# Patient Record
Sex: Male | Born: 1945 | Race: White | Hispanic: No | Marital: Single | Smoking: Current some day smoker
Health system: Southern US, Community
[De-identification: ages and names within clinical notes are randomized; demographics above are authoritative.]

## PROBLEM LIST (undated history)

## (undated) DIAGNOSIS — K449 Diaphragmatic hernia without obstruction or gangrene: Secondary | ICD-10-CM

## (undated) DIAGNOSIS — Z87442 Personal history of urinary calculi: Secondary | ICD-10-CM

## (undated) DIAGNOSIS — I2510 Atherosclerotic heart disease of native coronary artery without angina pectoris: Secondary | ICD-10-CM

## (undated) DIAGNOSIS — C801 Malignant (primary) neoplasm, unspecified: Secondary | ICD-10-CM

## (undated) DIAGNOSIS — N189 Chronic kidney disease, unspecified: Secondary | ICD-10-CM

## (undated) DIAGNOSIS — Z87891 Personal history of nicotine dependence: Secondary | ICD-10-CM

## (undated) DIAGNOSIS — E781 Pure hyperglyceridemia: Secondary | ICD-10-CM

## (undated) DIAGNOSIS — T7840XA Allergy, unspecified, initial encounter: Secondary | ICD-10-CM

## (undated) DIAGNOSIS — Z21 Asymptomatic human immunodeficiency virus [HIV] infection status: Secondary | ICD-10-CM

## (undated) DIAGNOSIS — L409 Psoriasis, unspecified: Secondary | ICD-10-CM

## (undated) DIAGNOSIS — I10 Essential (primary) hypertension: Secondary | ICD-10-CM

## (undated) DIAGNOSIS — M1990 Unspecified osteoarthritis, unspecified site: Secondary | ICD-10-CM

## (undated) DIAGNOSIS — B001 Herpesviral vesicular dermatitis: Secondary | ICD-10-CM

## (undated) DIAGNOSIS — K219 Gastro-esophageal reflux disease without esophagitis: Secondary | ICD-10-CM

## (undated) DIAGNOSIS — N200 Calculus of kidney: Secondary | ICD-10-CM

## (undated) DIAGNOSIS — B20 Human immunodeficiency virus [HIV] disease: Secondary | ICD-10-CM

## (undated) HISTORY — DX: Diaphragmatic hernia without obstruction or gangrene: K44.9

## (undated) HISTORY — DX: Psoriasis, unspecified: L40.9

## (undated) HISTORY — DX: Atherosclerotic heart disease of native coronary artery without angina pectoris: I25.10

## (undated) HISTORY — DX: Human immunodeficiency virus (HIV) disease: B20

## (undated) HISTORY — DX: Pure hyperglyceridemia: E78.1

## (undated) HISTORY — PX: APPENDECTOMY: SHX54

## (undated) HISTORY — DX: Personal history of nicotine dependence: Z87.891

## (undated) HISTORY — DX: Allergy, unspecified, initial encounter: T78.40XA

## (undated) HISTORY — DX: Calculus of kidney: N20.0

## (undated) HISTORY — DX: Asymptomatic human immunodeficiency virus (hiv) infection status: Z21

## (undated) HISTORY — DX: Herpesviral vesicular dermatitis: B00.1

---

## 1998-08-11 ENCOUNTER — Inpatient Hospital Stay (HOSPITAL_COMMUNITY): Admission: EM | Admit: 1998-08-11 | Discharge: 1998-08-12 | Payer: Self-pay | Admitting: Emergency Medicine

## 1998-08-11 ENCOUNTER — Encounter: Payer: Self-pay | Admitting: *Deleted

## 1998-08-14 ENCOUNTER — Encounter: Payer: Self-pay | Admitting: Cardiology

## 1998-08-14 ENCOUNTER — Ambulatory Visit (HOSPITAL_COMMUNITY): Admission: RE | Admit: 1998-08-14 | Discharge: 1998-08-14 | Payer: Self-pay | Admitting: Cardiology

## 1998-08-15 ENCOUNTER — Ambulatory Visit (HOSPITAL_COMMUNITY): Admission: RE | Admit: 1998-08-15 | Discharge: 1998-08-15 | Payer: Self-pay | Admitting: Cardiology

## 1998-08-15 ENCOUNTER — Encounter: Payer: Self-pay | Admitting: Cardiology

## 1998-08-20 ENCOUNTER — Inpatient Hospital Stay (HOSPITAL_COMMUNITY): Admission: RE | Admit: 1998-08-20 | Discharge: 1998-08-24 | Payer: Self-pay | Admitting: Cardiothoracic Surgery

## 1998-08-20 ENCOUNTER — Encounter: Payer: Self-pay | Admitting: Cardiothoracic Surgery

## 1998-08-20 HISTORY — PX: CORONARY ARTERY BYPASS GRAFT: SHX141

## 1998-08-21 ENCOUNTER — Encounter: Payer: Self-pay | Admitting: Cardiothoracic Surgery

## 1998-08-22 ENCOUNTER — Encounter: Payer: Self-pay | Admitting: Cardiothoracic Surgery

## 1999-03-11 HISTORY — PX: CORONARY ARTERY BYPASS GRAFT: SHX141

## 2003-05-04 ENCOUNTER — Observation Stay (HOSPITAL_COMMUNITY): Admission: RE | Admit: 2003-05-04 | Discharge: 2003-05-05 | Payer: Self-pay | Admitting: Urology

## 2003-05-08 ENCOUNTER — Ambulatory Visit (HOSPITAL_COMMUNITY): Admission: RE | Admit: 2003-05-08 | Discharge: 2003-05-08 | Payer: Self-pay | Admitting: Urology

## 2005-08-22 ENCOUNTER — Ambulatory Visit: Payer: Self-pay | Admitting: Family Medicine

## 2006-02-24 ENCOUNTER — Ambulatory Visit: Payer: Self-pay | Admitting: Family Medicine

## 2006-12-28 ENCOUNTER — Ambulatory Visit: Payer: Self-pay | Admitting: Family Medicine

## 2008-01-11 ENCOUNTER — Ambulatory Visit: Payer: Self-pay | Admitting: Family Medicine

## 2008-07-10 ENCOUNTER — Ambulatory Visit: Payer: Self-pay | Admitting: Family Medicine

## 2009-01-11 ENCOUNTER — Ambulatory Visit: Payer: Self-pay | Admitting: Family Medicine

## 2009-07-11 ENCOUNTER — Ambulatory Visit: Payer: Self-pay | Admitting: Family Medicine

## 2010-01-18 ENCOUNTER — Ambulatory Visit: Payer: Self-pay | Admitting: Family Medicine

## 2010-03-20 ENCOUNTER — Ambulatory Visit
Admission: RE | Admit: 2010-03-20 | Discharge: 2010-03-20 | Payer: Self-pay | Source: Home / Self Care | Attending: Family Medicine | Admitting: Family Medicine

## 2010-07-26 NOTE — Op Note (Signed)
[name], [name]                          ACCOUNT NO.:  [id number]   MEDICAL RECORD NO.:  [id number]                   PATIENT TYPE:  OBV   LOCATION:  DAY                                  FACILITY:  [facility]   PHYSICIAN:  Sigmund I. Patsi Sears, M.D.         DATE OF BIRTH:  11/02/1945   DATE OF PROCEDURE:  05/04/2003  DATE OF DISCHARGE:  05/05/2003                                 OPERATIVE REPORT   PREOPERATIVE DIAGNOSES:  Right renal calculus.   POSTOPERATIVE DIAGNOSES:  Right renal calculus.   OPERATION/PROCEDURE:  1. Cystoscopy.  2. Right percutaneous nephrostolithotomy.   SURGEON:  Sigmund I. Patsi Sears, M.D.  Judie Petit. Ruel Favors, M.D.   ASSISTANT:  Susanne Borders, M.D.   ANESTHESIA:  General endotracheal anesthesia.   SPECIMENS:  Stone for stone analysis.   ESTIMATED BLOOD LOSS:  Minimal.   DRAINS:  1. 20-French Councill-tip catheter as nephrostomy tube to straight drain.  2. 5-French angiographic catheter capped through the nephrostomy tube site.   DISPOSITION:  To recovery room in stable condition.   INDICATIONS FOR PROCEDURE:  [name] is a 65 year old who was evaluated  for right flank pain with CT stone study and found to have an approximately  1.5 cm stone at his right ureteropelvic junction.  Various treatment options  were discussed with the patient including extracorporeal shock wave  lithotripsy, ureteroscopy, and percutaneous nephrostolithotomy.  Because of  the size and position of the stone, it was felt that the most definitive  procedure would be a percutaneous nephrostolithotomy.  The patient agreed to  this and consented to the procedure after understanding all of the risks,  benefits and alternatives.   DESCRIPTION OF PROCEDURE:  The patient was brought to the operating room and  placed in the prone position.  He  was given preoperative antibiotics and  general endotracheal anesthesia. Prior to the procedure, the patient  underwent placement of a  right nephrostomy tube by interventional radiology.  The patient was found to have a 5-French angiographic catheter into his  right collecting system.  The patient was prepped and draped in the typical  sterile fashion.   Dr. Miles Costain was present at the beginning of the case and he got further access  for the procedure by balloon dilating the nephrostomy tube tract and placing  a working access sheath.  For complete details of this part of the  procedure, please see Dr. Chester Holstein note.  The case was then turned over to  Dr. Patsi Sears.   We used a 26-French nephrostomy tube and performed nephroscopy.  The stone  was found in the upper pole of the right kidney.  There was no stone  material in the lower middle calyces.  The ureteropelvic junction was found  to be stone-free as well.  The stone appeared to be quite soft.  An  ultrasonic lithotriptor was used to break the stone up into several small  fragments.  Most of  the fragments were sucked through the lithotriptor.  Some of the larger fragments were grasped with three-prong graspers and  removed from the renal pelvis.  Once the patient was rendered completely  stone-free, the case was turned back over to Dr. Miles Costain.  He placed an  angiographic catheter down into the bladder.  He also placed a 20-French  Councill-tip catheter as a nephrostomy tube in the right renal pelvis.  A  nephrostogram was done and demonstrated no filling defects.  It also  demonstrated the nephrostomy tube and angiographic catheter in the correct  position.  The nephrostomy tube and angiographic catheter were sewn in place  with a 2-0 silk tie.  The patient was then awakened from his anesthesia.  An  absorbent dressing was applied to the nephrostomy tube site.  The patient  was taken to post anesthesia care unit in stable condition having tolerated  the procedure well.   Please note that Dr. Patsi Sears was present and participated in all aspects  of this case as he  was primary Careers adviser.     Susanne Borders, MD                           Sigmund I. Patsi Sears, M.D.    DR/MEDQ  D:  05/04/2003  T:  05/04/2003  Job:  [id number]

## 2010-08-21 ENCOUNTER — Encounter: Payer: Self-pay | Admitting: Family Medicine

## 2010-08-21 ENCOUNTER — Ambulatory Visit (INDEPENDENT_AMBULATORY_CARE_PROVIDER_SITE_OTHER): Payer: BC Managed Care – PPO | Admitting: Family Medicine

## 2010-08-21 VITALS — BP 130/90 | HR 80 | Wt 156.0 lb

## 2010-08-21 DIAGNOSIS — J301 Allergic rhinitis due to pollen: Secondary | ICD-10-CM

## 2010-08-21 DIAGNOSIS — N200 Calculus of kidney: Secondary | ICD-10-CM

## 2010-08-21 DIAGNOSIS — B001 Herpesviral vesicular dermatitis: Secondary | ICD-10-CM

## 2010-08-21 DIAGNOSIS — E785 Hyperlipidemia, unspecified: Secondary | ICD-10-CM

## 2010-08-21 DIAGNOSIS — K449 Diaphragmatic hernia without obstruction or gangrene: Secondary | ICD-10-CM

## 2010-08-21 DIAGNOSIS — Z21 Asymptomatic human immunodeficiency virus [HIV] infection status: Secondary | ICD-10-CM | POA: Insufficient documentation

## 2010-08-21 DIAGNOSIS — K219 Gastro-esophageal reflux disease without esophagitis: Secondary | ICD-10-CM | POA: Insufficient documentation

## 2010-08-21 DIAGNOSIS — I2510 Atherosclerotic heart disease of native coronary artery without angina pectoris: Secondary | ICD-10-CM

## 2010-08-21 DIAGNOSIS — L4050 Arthropathic psoriasis, unspecified: Secondary | ICD-10-CM

## 2010-08-21 DIAGNOSIS — B009 Herpesviral infection, unspecified: Secondary | ICD-10-CM

## 2010-08-21 MED ORDER — ATORVASTATIN CALCIUM 10 MG PO TABS: 10.0000 mg | ORAL_TABLET | Freq: Every day | ORAL | Status: DC

## 2010-08-21 NOTE — Progress Notes (Signed)
  Subjective:    Patient ID: [name], male    DOB: [birth year], 65 y.o.   MRN: [id number]  HPI    Review of Systems     Objective:   Physical Exam        Assessment & Plan:  I will switch him to dispense as written Lipitor at his request.

## 2010-08-21 NOTE — Progress Notes (Signed)
  Subjective:    Patient ID: [name], male    DOB: 1945/12/13, 65 y.o.   MRN: [id number]  HPI He is here for recheck on his lipid panel and HIV. He has been taking one laser do to indigestion-type symptoms. He continues on a trip and does occasionally have vivid dreams. He also would like to be placed back on branded Lipitor. Apparently the generic Lipitor is causing some muscle aches and pains mainly in his neck   Review of Systems Negative except as above    Objective:   Physical Exam Alert and in no distress otherwise not examined       Assessment & Plan:  HIV-positive. Hypertriglyceridemia.  Check lipid panel and viral load.

## 2010-08-22 ENCOUNTER — Telehealth: Payer: Self-pay

## 2010-08-22 LAB — LIPID PANEL
Cholesterol: 159 mg/dL (ref 0–200)
HDL: 35 mg/dL — ABNORMAL LOW (ref >39)
Total CHOL/HDL Ratio: 4.5 Ratio
VLDL: 45 mg/dL — ABNORMAL HIGH (ref 0–40)

## 2010-08-22 LAB — HIV-1 RNA QUANT-NO REFLEX-BLD: HIV-1 RNA Quant, Log: <1.3 {Log} (ref <1.30)

## 2010-08-22 NOTE — Telephone Encounter (Signed)
Pt aware of labs and mailed diet info

## 2010-08-23 ENCOUNTER — Telehealth: Payer: Self-pay

## 2010-08-23 NOTE — Telephone Encounter (Signed)
Pt informed and diet mailed

## 2010-10-17 ENCOUNTER — Encounter: Payer: Self-pay | Admitting: Family Medicine

## 2010-11-26 ENCOUNTER — Telehealth: Payer: Self-pay | Admitting: Family Medicine

## 2010-11-26 NOTE — Telephone Encounter (Signed)
DONE

## 2011-01-15 ENCOUNTER — Encounter: Payer: Self-pay | Admitting: Family Medicine

## 2011-01-15 ENCOUNTER — Ambulatory Visit (INDEPENDENT_AMBULATORY_CARE_PROVIDER_SITE_OTHER): Payer: Medicare Other | Admitting: Family Medicine

## 2011-01-15 VITALS — BP 140/82 | HR 80 | Wt 152.0 lb

## 2011-01-15 DIAGNOSIS — I2510 Atherosclerotic heart disease of native coronary artery without angina pectoris: Secondary | ICD-10-CM

## 2011-01-15 DIAGNOSIS — Z79899 Other long term (current) drug therapy: Secondary | ICD-10-CM

## 2011-01-15 DIAGNOSIS — Z23 Encounter for immunization: Secondary | ICD-10-CM

## 2011-01-15 DIAGNOSIS — L4050 Arthropathic psoriasis, unspecified: Secondary | ICD-10-CM

## 2011-01-15 DIAGNOSIS — B20 Human immunodeficiency virus [HIV] disease: Secondary | ICD-10-CM

## 2011-01-15 MED ORDER — EFAVIRENZ-EMTRICITAB-TENOFOVIR 600-200-300 MG PO TABS: 1.0000 | ORAL_TABLET | Freq: Every day | ORAL | Status: DC

## 2011-01-15 NOTE — Progress Notes (Signed)
  Subjective:    Patient ID: [name], male    DOB: [birth year], 65 y.o.   MRN: [id number]  HPI He is here for a followup visit. He continues on medications listed in the chart. He has no particular concerns or complaints. No fever, chills, headache, weight change. He continues on medications listed in the chart. He does have psoriatic arthritis mainly affecting his hands. He seems to be doing well with this.   Review of Systems Negative except as above    Objective:   Physical Exam alert and in no distress. Tympanic membranes and canals are normal. Throat is clear. Tonsils are normal. Neck is supple without adenopathy or thyromegaly. Cardiac exam shows a regular sinus rhythm without murmurs or gallops. Lungs are clear to auscultation. Abdominal exam shows no masses or tenderness.       Assessment & Plan:   1. Human immunodeficiency virus (HIV) disease  HIV 1 RNA quant-no reflex-bld, T-helper cells (CD4) count  2. Encounter for long-term (current) use of other medications  HIV 1 RNA quant-no reflex-bld, T-helper cells (CD4) count  3. Psoriatic arthritis     continue on present medications.

## 2011-01-16 LAB — T-HELPER CELLS (CD4) COUNT (NOT AT ARMC)
Total Lymphocyte: 16 % (ref 12–46)
Total lymphocyte count: 1040 /uL (ref 700–3300)
WBC, lymph enumeration: 6.5 10*3/uL (ref 4.0–10.5)

## 2011-04-08 ENCOUNTER — Telehealth: Payer: Self-pay | Admitting: Internal Medicine

## 2011-04-08 MED ORDER — LOSARTAN POTASSIUM 25 MG PO TABS: 25.0000 mg | ORAL_TABLET | Freq: Every day | ORAL | Status: DC

## 2011-04-08 NOTE — Telephone Encounter (Signed)
Cozaar was renewed

## 2011-04-10 ENCOUNTER — Telehealth: Payer: Self-pay | Admitting: Family Medicine

## 2011-04-10 NOTE — Telephone Encounter (Signed)
Talked with pharm they had received ok 04/08/11 did not know what happened

## 2011-07-13 ENCOUNTER — Other Ambulatory Visit: Payer: Self-pay | Admitting: Family Medicine

## 2011-07-14 NOTE — Telephone Encounter (Signed)
Is this ok?

## 2011-07-14 NOTE — Telephone Encounter (Signed)
Celebrex renewed.

## 2011-07-15 ENCOUNTER — Ambulatory Visit: Payer: BC Managed Care – PPO | Admitting: Family Medicine

## 2011-08-21 ENCOUNTER — Encounter: Payer: Self-pay | Admitting: Family Medicine

## 2011-08-21 ENCOUNTER — Ambulatory Visit (INDEPENDENT_AMBULATORY_CARE_PROVIDER_SITE_OTHER): Payer: Medicare Other | Admitting: Family Medicine

## 2011-08-21 VITALS — BP 150/92 | HR 80 | Ht 68.0 in | Wt 155.0 lb

## 2011-08-21 DIAGNOSIS — L4050 Arthropathic psoriasis, unspecified: Secondary | ICD-10-CM

## 2011-08-21 DIAGNOSIS — I2510 Atherosclerotic heart disease of native coronary artery without angina pectoris: Secondary | ICD-10-CM

## 2011-08-21 DIAGNOSIS — Z79899 Other long term (current) drug therapy: Secondary | ICD-10-CM

## 2011-08-21 DIAGNOSIS — E785 Hyperlipidemia, unspecified: Secondary | ICD-10-CM

## 2011-08-21 DIAGNOSIS — B009 Herpesviral infection, unspecified: Secondary | ICD-10-CM

## 2011-08-21 DIAGNOSIS — Z21 Asymptomatic human immunodeficiency virus [HIV] infection status: Secondary | ICD-10-CM

## 2011-08-21 DIAGNOSIS — B001 Herpesviral vesicular dermatitis: Secondary | ICD-10-CM

## 2011-08-21 DIAGNOSIS — J301 Allergic rhinitis due to pollen: Secondary | ICD-10-CM

## 2011-08-21 DIAGNOSIS — I10 Essential (primary) hypertension: Secondary | ICD-10-CM

## 2011-08-21 MED ORDER — LOSARTAN POTASSIUM-HCTZ 50-12.5 MG PO TABS: 1.0000 | ORAL_TABLET | Freq: Every day | ORAL | Status: DC

## 2011-08-21 MED ORDER — ACYCLOVIR 5 % EX OINT: TOPICAL_OINTMENT | CUTANEOUS | Status: AC

## 2011-08-21 NOTE — Patient Instructions (Signed)
Continue on your present medications. 

## 2011-08-21 NOTE — Progress Notes (Signed)
  Subjective:    Patient ID: [name], male    DOB: [birth year], 66 y.o.   MRN: [id number]  HPI He is here for an interval evaluation. 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 He does find some difficulty when he has to much free time. He continues on medications listed in the chart and is concerned over the Celebrex he is taking for his psoriatic arthritis. He continues on his HIV medications. He would like a refill on his topical preparation for herpes labialis. He does intermittently have difficulty with this. His had no difficulty with chest pain, shortness of breath, DOE. His allergies seem to be under good control. He is no other concerns or complaints.   Review of Systems     Objective:   Physical Exam alert and in no distress. Tympanic membranes and canals are normal. Throat is clear. Tonsils are normal. Neck is supple without adenopathy or thyromegaly. Cardiac exam shows a regular sinus rhythm without murmurs or gallops. Lungs are clear to auscultation. Abdominal exam shows no masses or tenderness with normal bowel sounds. Exam of his hands does show extensive arthritic changes.        Assessment & Plan:   1. Hypertension  losartan-hydrochlorothiazide (HYZAAR) 50-12.5 MG per tablet  2. Herpes labialis  acyclovir ointment (ZOVIRAX) 5 %  3. HIV positive  T-helper cells (CD4) count, HIV 1 RNA quant-no reflex-bld  4. Psoriatic arthritis    5. ASHD (arteriosclerotic heart disease)    6. Allergic rhinitis due to pollen    7. Encounter for long-term (current) use of other medications  Comprehensive metabolic panel, Lipid panel, T-helper cells (CD4) count, HIV 1 RNA quant-no reflex-bld  8. Hyperlipidemia LDL goal <70  Lipid panel   Continue present medication regimen.

## 2011-08-22 LAB — COMPREHENSIVE METABOLIC PANEL
Albumin: 4.3 g/dL (ref 3.5–5.2)
BUN: 12 mg/dL (ref 6–23)
Calcium: 8.9 mg/dL (ref 8.4–10.5)
Chloride: 108 mEq/L (ref 96–112)
Glucose, Bld: 99 mg/dL (ref 70–99)
Potassium: 3.9 mEq/L (ref 3.5–5.3)

## 2011-08-22 LAB — T-HELPER CELLS (CD4) COUNT (NOT AT ARMC)
Absolute CD4: 304 /uL — ABNORMAL LOW (ref 381–1469)
CD4 T Helper %: 32 % (ref 32–62)

## 2011-08-22 LAB — LIPID PANEL
HDL: 30 mg/dL — ABNORMAL LOW (ref >39)
Triglycerides: 165 mg/dL — ABNORMAL HIGH (ref <150)

## 2011-08-25 LAB — HIV-1 RNA QUANT-NO REFLEX-BLD: HIV-1 RNA Quant, Log: <1.3 {Log} (ref <1.30)

## 2011-11-17 ENCOUNTER — Other Ambulatory Visit: Payer: Self-pay | Admitting: Family Medicine

## 2012-01-11 ENCOUNTER — Other Ambulatory Visit: Payer: Self-pay | Admitting: Family Medicine

## 2012-01-12 NOTE — Telephone Encounter (Signed)
IS THIS OK 

## 2012-02-04 ENCOUNTER — Encounter: Payer: Self-pay | Admitting: Internal Medicine

## 2012-02-23 ENCOUNTER — Ambulatory Visit: Payer: BC Managed Care – PPO | Admitting: Family Medicine

## 2012-03-15 ENCOUNTER — Ambulatory Visit (INDEPENDENT_AMBULATORY_CARE_PROVIDER_SITE_OTHER): Payer: Medicare Other | Admitting: Family Medicine

## 2012-03-15 ENCOUNTER — Encounter: Payer: Self-pay | Admitting: Family Medicine

## 2012-03-15 VITALS — BP 124/80 | HR 85 | Wt 153.0 lb

## 2012-03-15 DIAGNOSIS — B001 Herpesviral vesicular dermatitis: Secondary | ICD-10-CM

## 2012-03-15 DIAGNOSIS — I10 Essential (primary) hypertension: Secondary | ICD-10-CM

## 2012-03-15 DIAGNOSIS — J301 Allergic rhinitis due to pollen: Secondary | ICD-10-CM

## 2012-03-15 DIAGNOSIS — Z21 Asymptomatic human immunodeficiency virus [HIV] infection status: Secondary | ICD-10-CM

## 2012-03-15 DIAGNOSIS — Z23 Encounter for immunization: Secondary | ICD-10-CM

## 2012-03-15 DIAGNOSIS — I2510 Atherosclerotic heart disease of native coronary artery without angina pectoris: Secondary | ICD-10-CM

## 2012-03-15 DIAGNOSIS — L4050 Arthropathic psoriasis, unspecified: Secondary | ICD-10-CM

## 2012-03-15 DIAGNOSIS — E785 Hyperlipidemia, unspecified: Secondary | ICD-10-CM

## 2012-03-15 DIAGNOSIS — B009 Herpesviral infection, unspecified: Secondary | ICD-10-CM

## 2012-03-15 LAB — COMPREHENSIVE METABOLIC PANEL
Albumin: 4.7 g/dL (ref 3.5–5.2)
CO2: 27 mEq/L (ref 19–32)
Calcium: 9.5 mg/dL (ref 8.4–10.5)
Chloride: 103 mEq/L (ref 96–112)
Glucose, Bld: 100 mg/dL — ABNORMAL HIGH (ref 70–99)
Potassium: 4.5 mEq/L (ref 3.5–5.3)
Sodium: 139 mEq/L (ref 135–145)
Total Bilirubin: 0.4 mg/dL (ref 0.3–1.2)
Total Protein: 7.2 g/dL (ref 6.0–8.3)

## 2012-03-15 LAB — LIPID PANEL
Cholesterol: 154 mg/dL (ref 0–200)
VLDL: 46 mg/dL — ABNORMAL HIGH (ref 0–40)

## 2012-03-15 LAB — CBC WITH DIFFERENTIAL/PLATELET
Eosinophils Absolute: 0.1 10*3/uL (ref 0.0–0.7)
Hemoglobin: 15 g/dL (ref 13.0–17.0)
Lymphs Abs: 1.1 10*3/uL (ref 0.7–4.0)
MCH: 31 pg (ref 26.0–34.0)
MCV: 89 fL (ref 78.0–100.0)
Monocytes Relative: 6 % (ref 3–12)
Neutrophils Relative %: 76 % (ref 43–77)
RBC: 4.84 MIL/uL (ref 4.22–5.81)

## 2012-03-15 MED ORDER — CELECOXIB 200 MG PO CAPS: 200.0000 mg | ORAL_CAPSULE | Freq: Two times a day (BID) | ORAL | Status: DC

## 2012-03-15 MED ORDER — INFLUENZA VIRUS VACC SPLIT PF IM SUSP: 0.5000 mL | Freq: Once | INTRAMUSCULAR | Status: DC

## 2012-03-15 MED ORDER — ATORVASTATIN CALCIUM 10 MG PO TABS: 10.0000 mg | ORAL_TABLET | Freq: Every day | ORAL | Status: DC

## 2012-03-15 MED ORDER — LOSARTAN POTASSIUM-HCTZ 50-12.5 MG PO TABS: 1.0000 | ORAL_TABLET | Freq: Every day | ORAL | Status: DC

## 2012-03-15 MED ORDER — EFAVIRENZ-EMTRICITAB-TENOFOVIR 600-200-300 MG PO TABS: 1.0000 | ORAL_TABLET | Freq: Every day | ORAL | Status: DC

## 2012-03-15 NOTE — Progress Notes (Signed)
  Subjective:    Patient ID: [name], male    DOB: June 21, 1945, 67 y.o.   MRN: [id number]  HPI He is here for an interval evaluation. He continues to do quite nicely on his present medications. He has had no fever, chills, headache, weight change, chest pain, DOE, PND. He has not seen his cardiologist in approximately 5 years. His psoriatic arthritis seems to be under good control although he continues to have difficulty with hand deformity. His allergies seem to be under good control. He has had no trouble with his he has not used Zovirax a long time. Social history was reviewed. He is in a 20 year relationship which is stable. He continues to work intermittently and this is going quite well. He is now on a different insurance plan.   Review of Systems Negative except as above    Objective:   Physical Exam alert and in no distress. Fundi are benign. Tympanic membranes and canals are normal. Throat is clear. Tonsils are normal. Neck is supple without adenopathy or thyromegaly. Cardiac exam shows a regular sinus rhythm without murmurs or gallops. Lungs are clear to auscultation. Abdominal exam shows no masses or tenderness with normal bowel sounds. No axillary or inguinal adenopathy. EKG shows no acute changes      Assessment & Plan:   1. Need for prophylactic vaccination and inoculation against influenza  influenza  inactive virus vaccine (FLUZONE/FLUARIX) injection 0.5 mL, Tdap vaccine greater than or equal to 7yo IM  2. Hypertension  losartan-hydrochlorothiazide (HYZAAR) 50-12.5 MG per tablet, CBC with Differential, Comprehensive metabolic panel  3. HIV positive  efavirenz-emtricitabine-tenofovir (ATRIPLA) 600-200-300 MG per tablet, CBC with Differential, Comprehensive metabolic panel, HIV 1 RNA quant-no reflex-bld, T-helper cells (CD4) count  4. Hyperlipidemia  atorvastatin (LIPITOR) 10 MG tablet, Lipid panel  5. Psoriatic arthritis  celecoxib (CELEBREX) 200 MG capsule  6.  Allergic rhinitis due to pollen    7. ASHD (arteriosclerotic heart disease)  PR ELECTROCARDIOGRAM, COMPLETE  8. Herpes labialis     his immunizations were updated. Risks and benefits were discussed. He cannot get a shingles vaccine. Review his record indicates he was scheduled for colonoscopy 3 times but canceled. He will let me know the spring when we can reschedule this. I also discussed advanced directives with him and discussed the need for him to take legal measures to get his 20 year relationship squared away to protect his partner.

## 2012-03-16 LAB — HIV-1 RNA QUANT-NO REFLEX-BLD
HIV 1 RNA Quant: <20 copies/mL (ref <20)
HIV-1 RNA Quant, Log: <1.3 {Log} (ref <1.30)

## 2012-03-17 NOTE — Progress Notes (Signed)
Quick Note:  Called pt cell# left message to call me back ______

## 2012-03-18 NOTE — Progress Notes (Signed)
Quick Note:  Called pt back and informed pt of labs pt verbalized understanding  ______

## 2012-09-20 ENCOUNTER — Ambulatory Visit (INDEPENDENT_AMBULATORY_CARE_PROVIDER_SITE_OTHER): Payer: 59 | Admitting: Family Medicine

## 2012-09-20 ENCOUNTER — Encounter: Payer: Self-pay | Admitting: Family Medicine

## 2012-09-20 VITALS — BP 124/74 | HR 60 | Wt 152.0 lb

## 2012-09-20 DIAGNOSIS — Z21 Asymptomatic human immunodeficiency virus [HIV] infection status: Secondary | ICD-10-CM

## 2012-09-20 DIAGNOSIS — L4050 Arthropathic psoriasis, unspecified: Secondary | ICD-10-CM

## 2012-09-20 DIAGNOSIS — I2510 Atherosclerotic heart disease of native coronary artery without angina pectoris: Secondary | ICD-10-CM

## 2012-09-20 DIAGNOSIS — Z79899 Other long term (current) drug therapy: Secondary | ICD-10-CM

## 2012-09-20 DIAGNOSIS — K449 Diaphragmatic hernia without obstruction or gangrene: Secondary | ICD-10-CM

## 2012-09-20 DIAGNOSIS — E785 Hyperlipidemia, unspecified: Secondary | ICD-10-CM

## 2012-09-20 NOTE — Progress Notes (Signed)
  Subjective:    Patient ID: [name], male    DOB: [birth year], 67 y.o.   MRN: [id number]  HPI He is here for an interval evaluation. He continues on his present medication regimen and is doing quite well on this. He has had no chest pain, shortness of breath, weight change, nausea vomiting, fever or chills. He is now semiretired and does find that if he has to much free time, this does become disconcerting. He also notes that he is using Celebrex less often for his psoriatic arthritis.  Review of Systems     Objective:   Physical Exam alert and in no distress. Tympanic membranes and canals are normal. Throat is clear. Tonsils are normal. Neck is supple without adenopathy or thyromegaly. Cardiac exam shows a regular sinus rhythm without murmurs or gallops. Lungs are clear to auscultation. Exam of his hands does show arthritic changes with some ulnar deviation. Abdominal exam shows no masses or tenderness.       Assessment & Plan:  HIV positive - Plan: HIV 1 RNA quant-no reflex-bld, T-helper cells (CD4) count, T-helper cells (CD4) count  Hyperlipidemia  Psoriatic arthritis  ASHD (arteriosclerotic heart disease)  Hiatus hernia syndrome  Encounter for long-term (current) use of other medications - Plan: US Aorta Initial Medicare Screen, HIV 1 RNA quant-no reflex-bld, T-helper cells (CD4) count, T-helper cells (CD4) count  discussed retirement with him and strongly encouraged him to keep his mind and body busy. He does understand this concept.

## 2012-09-21 LAB — T-HELPER CELLS (CD4) COUNT (NOT AT ARMC)
Absolute CD4: 363 /uL — ABNORMAL LOW (ref 381–1469)
CD4 T Helper %: 33 % (ref 32–62)
Total Lymphocyte: 20 % (ref 12–46)
Total lymphocyte count: 1100 /uL (ref 700–3300)
WBC, lymph enumeration: 5.5 10*3/uL (ref 4.0–10.5)

## 2012-09-23 ENCOUNTER — Ambulatory Visit
Admission: RE | Admit: 2012-09-23 | Discharge: 2012-09-23 | Disposition: A | Discharge status: Home / Self Care | Payer: Medicare Other | Source: Ambulatory Visit | Attending: Family Medicine | Admitting: Family Medicine

## 2012-09-23 DIAGNOSIS — Z79899 Other long term (current) drug therapy: Secondary | ICD-10-CM

## 2013-02-09 ENCOUNTER — Other Ambulatory Visit: Payer: 59

## 2013-02-10 ENCOUNTER — Other Ambulatory Visit (INDEPENDENT_AMBULATORY_CARE_PROVIDER_SITE_OTHER): Payer: 59

## 2013-02-10 DIAGNOSIS — Z23 Encounter for immunization: Secondary | ICD-10-CM

## 2013-03-24 ENCOUNTER — Ambulatory Visit: Payer: 59 | Admitting: Family Medicine

## 2013-03-24 ENCOUNTER — Encounter: Payer: Self-pay | Admitting: Family Medicine

## 2013-03-24 ENCOUNTER — Ambulatory Visit (INDEPENDENT_AMBULATORY_CARE_PROVIDER_SITE_OTHER): Payer: 59 | Admitting: Family Medicine

## 2013-03-24 VITALS — BP 118/82 | HR 72 | Wt 149.0 lb

## 2013-03-24 DIAGNOSIS — E785 Hyperlipidemia, unspecified: Secondary | ICD-10-CM

## 2013-03-24 DIAGNOSIS — Z21 Asymptomatic human immunodeficiency virus [HIV] infection status: Secondary | ICD-10-CM

## 2013-03-24 DIAGNOSIS — Z79899 Other long term (current) drug therapy: Secondary | ICD-10-CM

## 2013-03-24 DIAGNOSIS — J301 Allergic rhinitis due to pollen: Secondary | ICD-10-CM

## 2013-03-24 DIAGNOSIS — Z23 Encounter for immunization: Secondary | ICD-10-CM

## 2013-03-24 DIAGNOSIS — L4050 Arthropathic psoriasis, unspecified: Secondary | ICD-10-CM

## 2013-03-24 DIAGNOSIS — N200 Calculus of kidney: Secondary | ICD-10-CM

## 2013-03-24 DIAGNOSIS — I2510 Atherosclerotic heart disease of native coronary artery without angina pectoris: Secondary | ICD-10-CM

## 2013-03-24 LAB — CBC WITH DIFFERENTIAL/PLATELET
Basophils Absolute: 0 10*3/uL (ref 0.0–0.1)
Basophils Relative: 0 % (ref 0–1)
EOS ABS: 0.1 10*3/uL (ref 0.0–0.7)
EOS PCT: 2 % (ref 0–5)
HEMATOCRIT: 47.2 % (ref 39.0–52.0)
HEMOGLOBIN: 16.4 g/dL (ref 13.0–17.0)
LYMPHS ABS: 1.1 10*3/uL (ref 0.7–4.0)
Lymphocytes Relative: 18 % (ref 12–46)
MCH: 32 pg (ref 26.0–34.0)
MCHC: 34.7 g/dL (ref 30.0–36.0)
MCV: 92 fL (ref 78.0–100.0)
MONO ABS: 0.3 10*3/uL (ref 0.1–1.0)
MONOS PCT: 6 % (ref 3–12)
NEUTROS PCT: 74 % (ref 43–77)
Neutro Abs: 4.3 10*3/uL (ref 1.7–7.7)
Platelets: 153 10*3/uL (ref 150–400)
RBC: 5.13 MIL/uL (ref 4.22–5.81)
RDW: 14 % (ref 11.5–15.5)
WBC: 5.8 10*3/uL (ref 4.0–10.5)

## 2013-03-24 LAB — COMPREHENSIVE METABOLIC PANEL
ALBUMIN: 4.6 g/dL (ref 3.5–5.2)
ALT: 20 U/L (ref 0–53)
AST: 21 U/L (ref 0–37)
Alkaline Phosphatase: 82 U/L (ref 39–117)
BILIRUBIN TOTAL: 0.4 mg/dL (ref 0.3–1.2)
BUN: 16 mg/dL (ref 6–23)
CO2: 23 meq/L (ref 19–32)
Calcium: 9.2 mg/dL (ref 8.4–10.5)
Chloride: 103 mEq/L (ref 96–112)
Creat: 0.9 mg/dL (ref 0.50–1.35)
GLUCOSE: 99 mg/dL (ref 70–99)
Potassium: 3.7 mEq/L (ref 3.5–5.3)
SODIUM: 138 meq/L (ref 135–145)
TOTAL PROTEIN: 7 g/dL (ref 6.0–8.3)

## 2013-03-24 LAB — LIPID PANEL
CHOLESTEROL: 145 mg/dL (ref 0–200)
HDL: 29 mg/dL — ABNORMAL LOW (ref >39)
LDL Cholesterol: 57 mg/dL (ref 0–99)
TRIGLYCERIDES: 295 mg/dL — AB (ref <150)
Total CHOL/HDL Ratio: 5 Ratio
VLDL: 59 mg/dL — AB (ref 0–40)

## 2013-03-24 NOTE — Progress Notes (Signed)
   Subjective:    Patient ID: [name], male    DOB: [birth year], 68 y.o.   MRN: [id number]  HPI He is here for recheck. He is doing quite well. He is enjoying his semiretired. He has had some issues dealing with insurance because of his present work/retirement situation. He's had no more chest pain, shortness of breath, DOE. He continues on his HIV medications and is having no difficulty with that. No fever, chills, headache, weight change. He has had no more difficulty with kidney stones. He continues to treat his psoriatic arthritis mainly with NSAID's. His had no more kidney stones. His home life is going well.    Review of Systems Negative except as above    Objective:   Physical Exam alert and in no distress. Fundi are benign. Tympanic membranes and canals are normal. Throat is clear. Tonsils are normal. Neck is supple without adenopathy or thyromegaly. Cardiac exam shows a regular sinus rhythm without murmurs or gallops. Lungs are clear to auscultation. Abdominal exam shows no hepatosplenomegaly, masses or tenderness. Reflexes normal. No axillary or inguinal adenopathy.        Assessment & Plan:  Allergic rhinitis due to pollen  ASHD (arteriosclerotic heart disease) - Plan: CBC with Differential, Comprehensive metabolic panel, Lipid panel  HIV positive - Plan: CBC with Differential, Comprehensive metabolic panel, Lipid panel, HIV 1 RNA quant-no reflex-bld, T-helper cells (CD4) count  Hyperlipidemia - Plan: Lipid panel  Psoriatic arthritis  Kidney stones  Encounter for long-term (current) use of other medications - Plan: Pneumococcal polysaccharide vaccine 23-valent greater than or equal to 2yo subcutaneous/IM  continue present medication regimen.

## 2013-03-25 LAB — T-HELPER CELLS (CD4) COUNT (NOT AT ARMC)
ABSOLUTE CD4: 345 /uL — AB (ref 381–1469)
CD4 T Helper %: 33 % (ref 32–62)
Total Lymphocyte: 18 % (ref 12–46)
Total lymphocyte count: 1044 /uL (ref 700–3300)
WBC, lymph enumeration: 5.8 10*3/uL (ref 4.0–10.5)

## 2013-03-27 LAB — HIV-1 RNA QUANT-NO REFLEX-BLD
HIV 1 RNA Quant: <20 copies/mL (ref <20)
HIV-1 RNA Quant, Log: <1.3 {Log} (ref <1.30)

## 2013-03-28 MED ORDER — EFAVIRENZ-EMTRICITAB-TENOFOVIR 600-200-300 MG PO TABS: 1.0000 | ORAL_TABLET | Freq: Every day | ORAL | Status: DC

## 2013-03-28 MED ORDER — CELECOXIB 200 MG PO CAPS: 200.0000 mg | ORAL_CAPSULE | Freq: Two times a day (BID) | ORAL | Status: DC

## 2013-03-28 MED ORDER — ATORVASTATIN CALCIUM 10 MG PO TABS: 10.0000 mg | ORAL_TABLET | Freq: Every day | ORAL | Status: DC

## 2013-03-28 NOTE — Progress Notes (Signed)
Pt aware rx sent into pharm

## 2013-03-28 NOTE — Addendum Note (Signed)
Addended by: Lise Auer on: 03/28/2013 03:09 PM   Modules accepted: Orders

## 2013-09-21 ENCOUNTER — Encounter: Payer: Self-pay | Admitting: Family Medicine

## 2013-09-21 ENCOUNTER — Ambulatory Visit (INDEPENDENT_AMBULATORY_CARE_PROVIDER_SITE_OTHER): Payer: 59 | Admitting: Family Medicine

## 2013-09-21 VITALS — BP 110/70 | HR 68 | Wt 147.0 lb

## 2013-09-21 DIAGNOSIS — N521 Erectile dysfunction due to diseases classified elsewhere: Secondary | ICD-10-CM

## 2013-09-21 DIAGNOSIS — J301 Allergic rhinitis due to pollen: Secondary | ICD-10-CM

## 2013-09-21 DIAGNOSIS — I10 Essential (primary) hypertension: Secondary | ICD-10-CM

## 2013-09-21 DIAGNOSIS — L4050 Arthropathic psoriasis, unspecified: Secondary | ICD-10-CM

## 2013-09-21 DIAGNOSIS — N529 Male erectile dysfunction, unspecified: Secondary | ICD-10-CM

## 2013-09-21 DIAGNOSIS — E785 Hyperlipidemia, unspecified: Secondary | ICD-10-CM

## 2013-09-21 DIAGNOSIS — K449 Diaphragmatic hernia without obstruction or gangrene: Secondary | ICD-10-CM

## 2013-09-21 DIAGNOSIS — I2510 Atherosclerotic heart disease of native coronary artery without angina pectoris: Secondary | ICD-10-CM

## 2013-09-21 DIAGNOSIS — Z21 Asymptomatic human immunodeficiency virus [HIV] infection status: Secondary | ICD-10-CM

## 2013-09-21 LAB — CBC WITH DIFFERENTIAL/PLATELET
Basophils Absolute: 0 10*3/uL (ref 0.0–0.1)
Basophils Relative: 0 % (ref 0–1)
EOS ABS: 0.1 10*3/uL (ref 0.0–0.7)
EOS PCT: 2 % (ref 0–5)
HEMATOCRIT: 43.8 % (ref 39.0–52.0)
Hemoglobin: 15.7 g/dL (ref 13.0–17.0)
Lymphocytes Relative: 19 % (ref 12–46)
Lymphs Abs: 1 10*3/uL (ref 0.7–4.0)
MCH: 31.7 pg (ref 26.0–34.0)
MCHC: 35.8 g/dL (ref 30.0–36.0)
MCV: 88.3 fL (ref 78.0–100.0)
MONO ABS: 0.3 10*3/uL (ref 0.1–1.0)
Monocytes Relative: 6 % (ref 3–12)
Neutro Abs: 4 10*3/uL (ref 1.7–7.7)
Neutrophils Relative %: 73 % (ref 43–77)
Platelets: 155 10*3/uL (ref 150–400)
RBC: 4.96 MIL/uL (ref 4.22–5.81)
RDW: 14.1 % (ref 11.5–15.5)
WBC: 5.5 10*3/uL (ref 4.0–10.5)

## 2013-09-21 LAB — LIPID PANEL
Cholesterol: 158 mg/dL (ref 0–200)
HDL: 31 mg/dL — ABNORMAL LOW (ref >39)
LDL Cholesterol: 76 mg/dL (ref 0–99)
Total CHOL/HDL Ratio: 5.1 Ratio
Triglycerides: 257 mg/dL — ABNORMAL HIGH (ref <150)
VLDL: 51 mg/dL — ABNORMAL HIGH (ref 0–40)

## 2013-09-21 LAB — COMPREHENSIVE METABOLIC PANEL
ALT: 18 U/L (ref 0–53)
AST: 18 U/L (ref 0–37)
Albumin: 4.5 g/dL (ref 3.5–5.2)
Alkaline Phosphatase: 75 U/L (ref 39–117)
BUN: 15 mg/dL (ref 6–23)
CALCIUM: 9.3 mg/dL (ref 8.4–10.5)
CO2: 26 mEq/L (ref 19–32)
Chloride: 103 mEq/L (ref 96–112)
Creat: 1.08 mg/dL (ref 0.50–1.35)
Glucose, Bld: 98 mg/dL (ref 70–99)
Potassium: 3.8 mEq/L (ref 3.5–5.3)
Sodium: 137 mEq/L (ref 135–145)
TOTAL PROTEIN: 6.9 g/dL (ref 6.0–8.3)
Total Bilirubin: 0.5 mg/dL (ref 0.2–1.2)

## 2013-09-21 MED ORDER — LOSARTAN POTASSIUM-HCTZ 50-12.5 MG PO TABS
1.0000 | ORAL_TABLET | Freq: Every day | ORAL | Status: DC
Start: 2013-09-21 — End: 2014-09-18

## 2013-09-21 MED ORDER — TADALAFIL 5 MG PO TABS: 5.0000 mg | ORAL_TABLET | Freq: Every day | ORAL | Status: DC | PRN

## 2013-09-21 NOTE — Progress Notes (Signed)
   Subjective:    Patient ID: [name], male    DOB: 01-29-1946, 68 y.o.   MRN: [id number]  HPI He is here for an interval evaluation. He is doing well on his HIV medications. He has had no fever, chills, cough, congestion, weight change. No chest pain, shortness of breath, DOE or PND. He has not seen his cardiologist in a few years. His arthritis seems to be under adequate control. He does occasionally take Pepcid for reflux symptoms. His allergies are under good control. He does have some difficulty with erectile dysfunction and does occasionally use Cialis. He notes that when he skips his blood pressure medication, the erectile issue is less of a concern. 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 His home life is going well. He keeps himself very physically active. Does occasionally smoke and drinks infrequently.   Review of Systems  All other systems reviewed and are negative.      Objective:   Physical Exam alert and in no distress. Tympanic membranes and canals are normal. Throat is clear. Tonsils are normal. Neck is supple without adenopathy or thyromegaly. Cardiac exam shows a regular sinus rhythm without murmurs or gallops. Lungs are clear to auscultation. Abdominal exam shows no masses or tenderness. No inguinal or axillary adenopathy. Fundi benign.        Assessment & Plan:  Psoriatic arthritis  Hyperlipidemia  HIV positive - Plan: Comprehensive metabolic panel, Lipid panel, HIV 1 RNA quant-no reflex-bld, T-helper cells (CD4) count, CBC with Differential  ASHD (arteriosclerotic heart disease) - Plan: Comprehensive metabolic panel, Lipid panel, CBC with Differential  Allergic rhinitis due to pollen  Hiatus hernia syndrome  Essential hypertension - Plan: losartan-hydrochlorothiazide (HYZAAR) 50-12.5 MG per tablet, Comprehensive metabolic panel, Lipid panel, CBC with Differential  Erectile dysfunction due to diseases classified elsewhere - Plan: tadalafil  (CIALIS) 5 MG tablet

## 2013-09-22 LAB — T-HELPER CELLS (CD4) COUNT (NOT AT ARMC)
Absolute CD4: 355 /uL — ABNORMAL LOW (ref 381–1469)
CD4 T Helper %: 34 % (ref 32–62)
TOTAL LYMPHOCYTE: 19 % (ref 12–46)
Total lymphocyte count: 1045 /uL (ref 700–3300)
WBC, LYMPH ENUMERATION: 5.5 10*3/uL (ref 4.0–10.5)

## 2013-09-22 LAB — HIV-1 RNA QUANT-NO REFLEX-BLD

## 2013-09-27 ENCOUNTER — Other Ambulatory Visit: Payer: Self-pay

## 2013-09-27 DIAGNOSIS — Z1211 Encounter for screening for malignant neoplasm of colon: Secondary | ICD-10-CM

## 2013-09-27 NOTE — Telephone Encounter (Signed)
I HAVE PUT IN ORDER FOR PT TO HAVE A GI VISIT

## 2013-12-02 ENCOUNTER — Encounter: Payer: Self-pay | Admitting: Family Medicine

## 2014-01-18 ENCOUNTER — Other Ambulatory Visit: Payer: Self-pay | Admitting: Family Medicine

## 2014-03-14 ENCOUNTER — Ambulatory Visit: Payer: 59 | Admitting: Family Medicine

## 2014-03-22 ENCOUNTER — Encounter: Payer: Self-pay | Admitting: Family Medicine

## 2014-03-22 ENCOUNTER — Ambulatory Visit (INDEPENDENT_AMBULATORY_CARE_PROVIDER_SITE_OTHER): Payer: 59 | Admitting: Family Medicine

## 2014-03-22 VITALS — BP 110/60 | HR 100 | Wt 147.0 lb

## 2014-03-22 DIAGNOSIS — L4050 Arthropathic psoriasis, unspecified: Secondary | ICD-10-CM

## 2014-03-22 DIAGNOSIS — E785 Hyperlipidemia, unspecified: Secondary | ICD-10-CM

## 2014-03-22 DIAGNOSIS — J301 Allergic rhinitis due to pollen: Secondary | ICD-10-CM

## 2014-03-22 DIAGNOSIS — Z21 Asymptomatic human immunodeficiency virus [HIV] infection status: Secondary | ICD-10-CM

## 2014-03-22 DIAGNOSIS — N200 Calculus of kidney: Secondary | ICD-10-CM

## 2014-03-22 DIAGNOSIS — I2510 Atherosclerotic heart disease of native coronary artery without angina pectoris: Secondary | ICD-10-CM

## 2014-03-22 LAB — CBC WITH DIFFERENTIAL/PLATELET
BASOS ABS: 0 10*3/uL (ref 0.0–0.1)
BASOS PCT: 0 % (ref 0–1)
EOS ABS: 0.1 10*3/uL (ref 0.0–0.7)
Eosinophils Relative: 1 % (ref 0–5)
HEMATOCRIT: 49.5 % (ref 39.0–52.0)
HEMOGLOBIN: 17.1 g/dL — AB (ref 13.0–17.0)
Lymphocytes Relative: 16 % (ref 12–46)
Lymphs Abs: 1.2 10*3/uL (ref 0.7–4.0)
MCH: 31.7 pg (ref 26.0–34.0)
MCHC: 34.5 g/dL (ref 30.0–36.0)
MCV: 91.7 fL (ref 78.0–100.0)
MONO ABS: 0.5 10*3/uL (ref 0.1–1.0)
MPV: 9.4 fL (ref 8.6–12.4)
Monocytes Relative: 6 % (ref 3–12)
NEUTROS PCT: 77 % (ref 43–77)
Neutro Abs: 5.8 10*3/uL (ref 1.7–7.7)
Platelets: 197 10*3/uL (ref 150–400)
RBC: 5.4 MIL/uL (ref 4.22–5.81)
RDW: 13.9 % (ref 11.5–15.5)
WBC: 7.5 10*3/uL (ref 4.0–10.5)

## 2014-03-22 LAB — COMPREHENSIVE METABOLIC PANEL
ALBUMIN: 4.7 g/dL (ref 3.5–5.2)
ALT: 20 U/L (ref 0–53)
AST: 19 U/L (ref 0–37)
Alkaline Phosphatase: 97 U/L (ref 39–117)
BILIRUBIN TOTAL: 0.5 mg/dL (ref 0.2–1.2)
BUN: 16 mg/dL (ref 6–23)
CO2: 23 mEq/L (ref 19–32)
CREATININE: 1.13 mg/dL (ref 0.50–1.35)
Calcium: 9.6 mg/dL (ref 8.4–10.5)
Chloride: 101 mEq/L (ref 96–112)
GLUCOSE: 113 mg/dL — AB (ref 70–99)
Potassium: 4.1 mEq/L (ref 3.5–5.3)
Sodium: 135 mEq/L (ref 135–145)
Total Protein: 7.3 g/dL (ref 6.0–8.3)

## 2014-03-22 LAB — LIPID PANEL
CHOLESTEROL: 154 mg/dL (ref 0–200)
HDL: 28 mg/dL — AB (ref >39)
LDL Cholesterol: 63 mg/dL (ref 0–99)
TRIGLYCERIDES: 316 mg/dL — AB (ref <150)
Total CHOL/HDL Ratio: 5.5 Ratio
VLDL: 63 mg/dL — ABNORMAL HIGH (ref 0–40)

## 2014-03-22 NOTE — Progress Notes (Signed)
   Subjective:    Patient ID: [name], male    DOB: 1945/10/11, 69 y.o.   MRN: [id number]  HPI He is here for an interval evaluation. He continues on his HIV meds and is having no difficulty from them. His psoriatic arthritis does periodically get him problem and he uses Celebrex with good results. He's had no chest pain, shortness of breath, DOE or PND. His allergies are under good control. He has not had any more kidney stones. He did not get his colonoscopy but will schedule it when he comes back from [place]. 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 His home life is going well. Family and social history were also reviewed.   Review of Systems  All other systems reviewed and are negative.      Objective:   Physical Exam alert and in no distress. Tympanic membranes and canals are normal. Throat is clear. Tonsils are normal. Neck is supple without adenopathy or thyromegaly. Cardiac exam shows a regular sinus rhythm without murmurs or gallops. Lungs are clear to auscultation. Abdominal exam shows no masses or tenderness with normal bowel sounds       Assessment & Plan:  Psoriatic arthritis - Plan: CBC with Differential, Comprehensive metabolic panel  HIV positive - Plan: CBC with Differential, Comprehensive metabolic panel, Lipid panel, HIV 1 RNA quant-no reflex-bld, T-helper cells (CD4) count  ASHD (arteriosclerotic heart disease)  Allergic rhinitis due to pollen  Hyperlipidemia - Plan: Lipid panel  Kidney stones  he is doing a great job taking care of himself. He will call when he comes back from [place] to get scheduled for colonoscopy.

## 2014-03-23 LAB — T-HELPER CELLS (CD4) COUNT (NOT AT ARMC)
ABSOLUTE CD4: 360 /uL — AB (ref 381–1469)
CD4 T HELPER %: 30 % — AB (ref 32–62)
TOTAL LYMPHOCYTE COUNT: 1200 /uL (ref 700–3300)
Total Lymphocyte: 16 % (ref 12–46)
WBC, lymph enumeration: 7.5 10*3/uL (ref 4.0–10.5)

## 2014-03-24 LAB — HIV-1 RNA QUANT-NO REFLEX-BLD

## 2014-03-27 ENCOUNTER — Other Ambulatory Visit: Payer: Self-pay

## 2014-03-29 ENCOUNTER — Other Ambulatory Visit: Payer: Self-pay | Admitting: *Deleted

## 2014-03-29 ENCOUNTER — Other Ambulatory Visit (INDEPENDENT_AMBULATORY_CARE_PROVIDER_SITE_OTHER): Payer: 59

## 2014-03-29 DIAGNOSIS — Z23 Encounter for immunization: Secondary | ICD-10-CM

## 2014-03-29 MED ORDER — EFAVIRENZ-EMTRICITAB-TENOFOVIR 600-200-300 MG PO TABS: ORAL_TABLET | ORAL | Status: DC

## 2014-05-07 ENCOUNTER — Other Ambulatory Visit: Payer: Self-pay | Admitting: Family Medicine

## 2014-05-08 ENCOUNTER — Telehealth: Payer: Self-pay | Admitting: Family Medicine

## 2014-05-08 DIAGNOSIS — L4050 Arthropathic psoriasis, unspecified: Secondary | ICD-10-CM

## 2014-05-08 MED ORDER — CELECOXIB 200 MG PO CAPS: 200.0000 mg | ORAL_CAPSULE | Freq: Two times a day (BID) | ORAL | Status: DC

## 2014-05-08 NOTE — Telephone Encounter (Signed)
[facility] req refill Celebrex 200 mg

## 2014-09-18 ENCOUNTER — Encounter: Payer: Self-pay | Admitting: Family Medicine

## 2014-09-18 ENCOUNTER — Ambulatory Visit (INDEPENDENT_AMBULATORY_CARE_PROVIDER_SITE_OTHER): Payer: Medicare Other | Admitting: Family Medicine

## 2014-09-18 VITALS — BP 100/60 | HR 100 | Ht 68.0 in | Wt 149.0 lb

## 2014-09-18 DIAGNOSIS — I2510 Atherosclerotic heart disease of native coronary artery without angina pectoris: Secondary | ICD-10-CM | POA: Diagnosis not present

## 2014-09-18 DIAGNOSIS — I10 Essential (primary) hypertension: Secondary | ICD-10-CM

## 2014-09-18 DIAGNOSIS — Z21 Asymptomatic human immunodeficiency virus [HIV] infection status: Secondary | ICD-10-CM

## 2014-09-18 DIAGNOSIS — B001 Herpesviral vesicular dermatitis: Secondary | ICD-10-CM

## 2014-09-18 DIAGNOSIS — J301 Allergic rhinitis due to pollen: Secondary | ICD-10-CM

## 2014-09-18 DIAGNOSIS — E785 Hyperlipidemia, unspecified: Secondary | ICD-10-CM | POA: Diagnosis not present

## 2014-09-18 DIAGNOSIS — N200 Calculus of kidney: Secondary | ICD-10-CM

## 2014-09-18 DIAGNOSIS — K449 Diaphragmatic hernia without obstruction or gangrene: Secondary | ICD-10-CM

## 2014-09-18 DIAGNOSIS — Z87891 Personal history of nicotine dependence: Secondary | ICD-10-CM | POA: Diagnosis not present

## 2014-09-18 DIAGNOSIS — L4050 Arthropathic psoriasis, unspecified: Secondary | ICD-10-CM

## 2014-09-18 LAB — LIPID PANEL
Cholesterol: 178 mg/dL (ref 0–200)
HDL: 27 mg/dL — AB (ref >=40)
LDL CALC: 85 mg/dL (ref 0–99)
TRIGLYCERIDES: 329 mg/dL — AB (ref <150)
Total CHOL/HDL Ratio: 6.6 Ratio
VLDL: 66 mg/dL — ABNORMAL HIGH (ref 0–40)

## 2014-09-18 LAB — COMPREHENSIVE METABOLIC PANEL
ALBUMIN: 4.6 g/dL (ref 3.5–5.2)
ALK PHOS: 79 U/L (ref 39–117)
ALT: 24 U/L (ref 0–53)
AST: 23 U/L (ref 0–37)
BUN: 19 mg/dL (ref 6–23)
CALCIUM: 9.7 mg/dL (ref 8.4–10.5)
CO2: 24 meq/L (ref 19–32)
CREATININE: 1.24 mg/dL (ref 0.50–1.35)
Chloride: 100 mEq/L (ref 96–112)
GLUCOSE: 122 mg/dL — AB (ref 70–99)
POTASSIUM: 3.6 meq/L (ref 3.5–5.3)
Sodium: 136 mEq/L (ref 135–145)
Total Bilirubin: 0.6 mg/dL (ref 0.2–1.2)
Total Protein: 7.2 g/dL (ref 6.0–8.3)

## 2014-09-18 MED ORDER — LOSARTAN POTASSIUM-HCTZ 50-12.5 MG PO TABS: 1.0000 | ORAL_TABLET | Freq: Every day | ORAL | Status: DC

## 2014-09-18 MED ORDER — ZOLPIDEM TARTRATE 5 MG PO TABS: 5.0000 mg | ORAL_TABLET | Freq: Every evening | ORAL | Status: DC | PRN

## 2014-09-18 MED ORDER — NITROGLYCERIN 0.3 MG SL SUBL: 0.3000 mg | SUBLINGUAL_TABLET | SUBLINGUAL | Status: AC | PRN

## 2014-09-18 MED ORDER — EFAVIRENZ-EMTRICITAB-TENOFOVIR 600-200-300 MG PO TABS: ORAL_TABLET | ORAL | Status: DC

## 2014-09-18 MED ORDER — LIPITOR 10 MG PO TABS: ORAL_TABLET | ORAL | Status: DC

## 2014-09-18 NOTE — Progress Notes (Signed)
Subjective:    Patient ID: [name], male    DOB: June 14, 1945, 69 y.o.   MRN: [id number]  HPI He is here for a six-month follow-up. He continues to do quite well. 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 He has no symptoms of depression. He has had previous abdominal ultrasound to evaluate for AAA. He has had no problems with peripheral arterial disease. His physical activity is adequate. He is not interested in having a Colonoscopy. He continues on Lipitor. He has had no chest pain, shortness breath, DOE. He has not seen his cardiologist in quite some time. He would like a refill on his nitroglycerin. His allergies are under good control. He does have psoriatic arthritis and does have changes in his hands consistent with this. He has had no reflux symptoms. His herpes is not giving him any trouble. He has not had kidney stones in quite some time. He did quit smoking approximately one or 2 years ago. He does qualify to have the limited CT scanning done.Family and social history as well as felt maintenance and immunizations were reviewed. He is in a long-term relationship which does seem to be going fairly well.  Review of Systems  All other systems reviewed and are negative.      Objective:   Physical Exam Alert and in no distress. EOMI. Funduscopic exam normal. Tympanic membranes and canals are normal. Pharyngeal area is normal. Neck is supple without adenopathy or thyromegaly. Cardiac exam shows a regular sinus rhythm without murmurs or gallops. Lungs are clear to auscultation.Exam of his hands does show chronic degenerative changes with ulnar deviation. Abdominal exam shows no masses or tenderness. No axillary or inguinal adenopathy is noted.        Assessment & Plan:     Shared Decision Making Visit Lung Cancer Screening Program [phone])   Eligibility:  Age 42 y.o.  Pack Years Smoking History Calculation 40   (# packs/per year x # years  smoked)  Recent History of coughing up blood  no  Unexplained weight loss? no ( >Than 15 pounds within the last 6 months )  Prior History Lung / other cancer no (Diagnosis within the last 5 years already requiring surveillance chest CT Scans).  Smoking Status Former Smoker  Former Smokers: Years since quit: 2 years  Quit Date: 2014  Visit Components:  Discussion included one or more decision making aids. yes  Discussion included risk/benefits of screening. yes  Discussion included potential follow up diagnostic testing for abnormal scans. yes  Discussion included meaning and risk of over diagnosis. yes  Discussion included meaning and risk of False Positives. yes  Discussion included meaning of total radiation exposure. yes  Counseling Included:  Importance of adherence to annual lung cancer LDCT screening. yes  Impact of comorbidities on ability to participate in the program. yes  Ability and willingness to under diagnostic treatment. yes  Smoking Cessation Counseling:  Current Smokers:   Discussed importance of smoking cessation. yes  Information about tobacco cessation classes and interventions provided to patient. yes  Patient provided with "ticket" for LDCT Scan. yes  Symptomatic Patient. no  Counseling(Intermediate counseling: > three minutes) 99406  Diagnosis Code: Tobacco Use Z72.0  Asymptomatic Patient yes  Counseling (Intermediate counseling: > three minutes counseling) [id number]  Former Smokers:   Discussed the importance of maintaining cigarette abstinence. yes  Diagnosis Code: Personal History of Nicotine Dependence. Z87.891  Information about tobacco cessation classes and interventions  provided to patient. Refused  Patient provided with "ticket" for LDCT Scan. no  Written Order for Lung Cancer Screening with LDCT placed in Epic. Yes (CT Chest Lung Cancer Screening Low Dose W/O CM) [id number] Z12.2-Screening of respiratory  organs Z87.891-Personal history of nicotine dependence HIV positive - Plan: CBC with Differential/Platelet, Comprehensive metabolic panel, Lipid panel, HIV 1 RNA quant-no reflex-bld, T-helper cells (CD4) count, efavirenz-emtricitabine-tenofovir (ATRIPLA) 600-200-300 MG per tablet  Hyperlipidemia - Plan: Lipid panel, LIPITOR 10 MG tablet  Psoriatic arthritis - Plan: CBC with Differential/Platelet, Comprehensive metabolic panel  ASHD (arteriosclerotic heart disease) - Plan: nitroGLYCERIN (NITROSTAT) 0.3 MG SL tablet, EKG 12-Lead, Ambulatory referral to Cardiology  Allergic rhinitis due to pollen  Hiatus hernia syndrome  Kidney stones  Herpes labialis  Essential hypertension - Plan: losartan-hydrochlorothiazide (HYZAAR) 50-12.5 MG per tablet  Former smoker - Plan: CT CHEST LUNG CA SCREEN LOW DOSE W/O CM Encouraged him to continue to take good care of himself. Also discussed smoking and remaining smoke free. He does occasionally light up and I strongly encouraged him to avoid this. Be referred to cardiology since it has been several years. Was also given information on advanced directive and counseled concerning this.  Wyatt Haste, MD

## 2014-09-19 LAB — CBC WITH DIFFERENTIAL/PLATELET
BASOS ABS: 0 10*3/uL (ref 0.0–0.1)
Basophils Relative: 0 % (ref 0–1)
EOS PCT: 1 % (ref 0–5)
Eosinophils Absolute: 0.1 10*3/uL (ref 0.0–0.7)
HCT: 46.4 % (ref 39.0–52.0)
HEMOGLOBIN: 16 g/dL (ref 13.0–17.0)
LYMPHS ABS: 1.1 10*3/uL (ref 0.7–4.0)
Lymphocytes Relative: 18 % (ref 12–46)
MCH: 31.3 pg (ref 26.0–34.0)
MCHC: 34.5 g/dL (ref 30.0–36.0)
MCV: 90.6 fL (ref 78.0–100.0)
MONO ABS: 0.5 10*3/uL (ref 0.1–1.0)
MPV: 8.9 fL (ref 8.6–12.4)
Monocytes Relative: 8 % (ref 3–12)
NEUTROS PCT: 73 % (ref 43–77)
Neutro Abs: 4.5 10*3/uL (ref 1.7–7.7)
Platelets: 178 10*3/uL (ref 150–400)
RBC: 5.12 MIL/uL (ref 4.22–5.81)
RDW: 14.1 % (ref 11.5–15.5)
WBC: 6.1 10*3/uL (ref 4.0–10.5)

## 2014-09-19 LAB — T-HELPER CELLS (CD4) COUNT (NOT AT ARMC)
Absolute CD4: 351 /uL — ABNORMAL LOW (ref 381–1469)
CD4 T Helper %: 32 % (ref 32–62)
Total Lymphocyte: 18 % (ref 12–46)
Total lymphocyte count: 1098 /uL (ref 700–3300)
WBC, lymph enumeration: 6.1 10*3/uL (ref 4.0–10.5)

## 2014-09-19 LAB — HIV-1 RNA QUANT-NO REFLEX-BLD: HIV-1 RNA Quant, Log: <1.3 {Log} (ref <1.30)

## 2014-10-03 ENCOUNTER — Ambulatory Visit
Admission: RE | Admit: 2014-10-03 | Discharge: 2014-10-03 | Disposition: A | Discharge status: Home / Self Care | Payer: Medicare Other | Source: Ambulatory Visit | Attending: Family Medicine | Admitting: Family Medicine

## 2014-10-03 DIAGNOSIS — Z87891 Personal history of nicotine dependence: Secondary | ICD-10-CM

## 2014-11-20 ENCOUNTER — Ambulatory Visit: Payer: Medicare Other | Admitting: Interventional Cardiology

## 2015-01-26 ENCOUNTER — Telehealth: Payer: Self-pay | Admitting: Family Medicine

## 2015-01-26 NOTE — Telephone Encounter (Signed)
Pt called back and said he had a appt in [place] and was to busy to come in now for a medcheck appt and would make a CPE or a medcheck appt when he come in [place]

## 2015-01-26 NOTE — Telephone Encounter (Signed)
Called and left message for pt to try and schedule a med check before the end of the year left message on cell and home

## 2015-02-05 ENCOUNTER — Institutional Professional Consult (permissible substitution): Payer: Medicare Other | Admitting: Family Medicine

## 2015-02-07 ENCOUNTER — Telehealth: Payer: Self-pay

## 2015-02-07 DIAGNOSIS — Z21 Asymptomatic human immunodeficiency virus [HIV] infection status: Secondary | ICD-10-CM

## 2015-02-07 MED ORDER — EFAVIRENZ-EMTRICITAB-TENOFOVIR 600-200-300 MG PO TABS: ORAL_TABLET | ORAL | Status: DC

## 2015-02-07 NOTE — Telephone Encounter (Signed)
Pt called requesting a refill on his Atripla 600-200-300mg  #90 sent through [facility]

## 2015-02-13 ENCOUNTER — Telehealth: Payer: Self-pay

## 2015-02-13 NOTE — Telephone Encounter (Signed)
It is no longer [facility] for this pt, it is [facility] (already put in chart demographics as pharmacy)

## 2015-03-21 ENCOUNTER — Encounter: Payer: Self-pay | Admitting: Family Medicine

## 2015-03-21 ENCOUNTER — Ambulatory Visit (INDEPENDENT_AMBULATORY_CARE_PROVIDER_SITE_OTHER): Payer: Medicare Other | Admitting: Family Medicine

## 2015-03-21 VITALS — BP 132/78 | HR 83 | Ht 68.0 in | Wt 143.8 lb

## 2015-03-21 DIAGNOSIS — Z23 Encounter for immunization: Secondary | ICD-10-CM

## 2015-03-21 DIAGNOSIS — E785 Hyperlipidemia, unspecified: Secondary | ICD-10-CM | POA: Diagnosis not present

## 2015-03-21 DIAGNOSIS — Z21 Asymptomatic human immunodeficiency virus [HIV] infection status: Secondary | ICD-10-CM | POA: Diagnosis not present

## 2015-03-21 DIAGNOSIS — K219 Gastro-esophageal reflux disease without esophagitis: Secondary | ICD-10-CM | POA: Diagnosis not present

## 2015-03-21 DIAGNOSIS — B001 Herpesviral vesicular dermatitis: Secondary | ICD-10-CM | POA: Diagnosis not present

## 2015-03-21 DIAGNOSIS — L4050 Arthropathic psoriasis, unspecified: Secondary | ICD-10-CM

## 2015-03-21 DIAGNOSIS — I2510 Atherosclerotic heart disease of native coronary artery without angina pectoris: Secondary | ICD-10-CM

## 2015-03-21 LAB — COMPREHENSIVE METABOLIC PANEL
ALBUMIN: 4.3 g/dL (ref 3.6–5.1)
ALK PHOS: 78 U/L (ref 40–115)
ALT: 15 U/L (ref 9–46)
AST: 16 U/L (ref 10–35)
BILIRUBIN TOTAL: 0.3 mg/dL (ref 0.2–1.2)
BUN: 13 mg/dL (ref 7–25)
CALCIUM: 9.1 mg/dL (ref 8.6–10.3)
CO2: 26 mmol/L (ref 20–31)
Chloride: 104 mmol/L (ref 98–110)
Creat: 1.07 mg/dL (ref 0.70–1.25)
GLUCOSE: 109 mg/dL — AB (ref 65–99)
Potassium: 4 mmol/L (ref 3.5–5.3)
Sodium: 135 mmol/L (ref 135–146)
TOTAL PROTEIN: 6.5 g/dL (ref 6.1–8.1)

## 2015-03-21 LAB — LIPID PANEL
CHOLESTEROL: 158 mg/dL (ref 125–200)
HDL: 25 mg/dL — AB (ref >=40)
LDL Cholesterol: 84 mg/dL (ref <130)
Total CHOL/HDL Ratio: 6.3 Ratio — ABNORMAL HIGH (ref <=5.0)
Triglycerides: 245 mg/dL — ABNORMAL HIGH (ref <150)
VLDL: 49 mg/dL — ABNORMAL HIGH (ref <30)

## 2015-03-21 LAB — CBC WITH DIFFERENTIAL/PLATELET
Basophils Absolute: 0 10*3/uL (ref 0.0–0.1)
Basophils Relative: 0 % (ref 0–1)
Eosinophils Absolute: 0 10*3/uL (ref 0.0–0.7)
Eosinophils Relative: 1 % (ref 0–5)
HEMATOCRIT: 45 % (ref 39.0–52.0)
Hemoglobin: 15.6 g/dL (ref 13.0–17.0)
LYMPHS PCT: 20 % (ref 12–46)
Lymphs Abs: 0.9 10*3/uL (ref 0.7–4.0)
MCH: 31.8 pg (ref 26.0–34.0)
MCHC: 34.7 g/dL (ref 30.0–36.0)
MCV: 91.8 fL (ref 78.0–100.0)
MONOS PCT: 7 % (ref 3–12)
MPV: 9.2 fL (ref 8.6–12.4)
Monocytes Absolute: 0.3 10*3/uL (ref 0.1–1.0)
NEUTROS ABS: 3.2 10*3/uL (ref 1.7–7.7)
Neutrophils Relative %: 72 % (ref 43–77)
Platelets: 136 10*3/uL — ABNORMAL LOW (ref 150–400)
RBC: 4.9 MIL/uL (ref 4.22–5.81)
RDW: 14.2 % (ref 11.5–15.5)
WBC: 4.5 10*3/uL (ref 4.0–10.5)

## 2015-03-21 NOTE — Progress Notes (Signed)
   Subjective:    Patient ID: [name], male    DOB: [birth year], 70 y.o.   MRN: [id number]  HPI He is here for a follow-up visit. He does have underlying heart disease but is having no chest pain, shortness of breath, weakness or palpitations. He continues to do well on his Atripla. He does have a history of hyperlipidemia. His psoriatic arthritis is doing well. He occasionally will use of Celebrex for this. He rarely has difficulty with reflux disease. He does get a herpes labialis infection when he is exposed to sunlight which usually occurs when he goes to the [place]. He does go there several times per year as his family does own property down there. He has no other concerns or complaints. Social history, health maintenance and immunizations were reviewed.   Review of Systems     Objective:   Physical Exam Alert and in no distress. Tympanic membranes and canals are normal. Pharyngeal area is normal. Neck is supple without adenopathy or thyromegaly. Cardiac exam shows a regular sinus rhythm without murmurs or gallops. Lungs are clear to auscultation., Abdominal shows no masses or tenderness. No axillary or inguinal adenopathy.Arthritic changes with ulnar deviation are noted in his hands.        Assessment & Plan:  ASHD (arteriosclerotic heart disease) - Plan: CBC with Differential/Platelet, Comprehensive metabolic panel, Lipid panel  HIV positive ([place]) - Plan: Flu vaccine HIGH DOSE PF (Fluzone High dose), CBC with Differential/Platelet, Comprehensive metabolic panel, Lipid panel, HIV 1 RNA quant-no reflex-bld, T-helper cells (CD4) count (not at [facility])  Hyperlipidemia - Plan: Lipid panel  Psoriatic arthritis ([place])  Gastroesophageal reflux disease without esophagitis  Herpes labialis  Need for prophylactic vaccination and inoculation against influenza - Plan: Flu vaccine HIGH DOSE PF (Fluzone High dose) all of his medicines at the present time do notneed to be renewed.I  will also order a Cologard

## 2015-03-22 LAB — T-HELPER CELLS (CD4) COUNT (NOT AT ARMC)
Absolute CD4: 367 cells/uL — ABNORMAL LOW (ref 381–1469)
CD4 T HELPER %: 37 % (ref 32–62)
TOTAL LYMPHOCYTE COUNT: 1001 {cells}/uL (ref 700–3300)

## 2015-03-22 LAB — HIV-1 RNA QUANT-NO REFLEX-BLD
HIV 1 RNA Quant: <20 copies/mL (ref <20)
HIV-1 RNA Quant, Log: <1.3 Log copies/mL (ref <1.30)

## 2015-05-29 ENCOUNTER — Other Ambulatory Visit: Payer: Self-pay | Admitting: Acute Care

## 2015-05-29 DIAGNOSIS — Z87891 Personal history of nicotine dependence: Secondary | ICD-10-CM

## 2015-07-11 ENCOUNTER — Other Ambulatory Visit: Payer: Self-pay | Admitting: Family Medicine

## 2015-09-12 ENCOUNTER — Other Ambulatory Visit: Payer: Self-pay | Admitting: Family Medicine

## 2015-09-13 NOTE — Telephone Encounter (Signed)
Is this okay?

## 2015-09-13 NOTE — Telephone Encounter (Signed)
He needs a follow-up appointment

## 2015-09-13 NOTE — Telephone Encounter (Signed)
Pt has appointment 

## 2015-09-25 ENCOUNTER — Ambulatory Visit (INDEPENDENT_AMBULATORY_CARE_PROVIDER_SITE_OTHER): Payer: Medicare Other | Admitting: Family Medicine

## 2015-09-25 ENCOUNTER — Encounter: Payer: Self-pay | Admitting: Family Medicine

## 2015-09-25 VITALS — BP 128/80 | HR 80 | Ht 68.0 in | Wt 146.2 lb

## 2015-09-25 DIAGNOSIS — E785 Hyperlipidemia, unspecified: Secondary | ICD-10-CM | POA: Diagnosis not present

## 2015-09-25 DIAGNOSIS — K219 Gastro-esophageal reflux disease without esophagitis: Secondary | ICD-10-CM

## 2015-09-25 DIAGNOSIS — J301 Allergic rhinitis due to pollen: Secondary | ICD-10-CM

## 2015-09-25 DIAGNOSIS — Z1159 Encounter for screening for other viral diseases: Secondary | ICD-10-CM | POA: Diagnosis not present

## 2015-09-25 DIAGNOSIS — Z1211 Encounter for screening for malignant neoplasm of colon: Secondary | ICD-10-CM

## 2015-09-25 DIAGNOSIS — Z21 Asymptomatic human immunodeficiency virus [HIV] infection status: Secondary | ICD-10-CM

## 2015-09-25 DIAGNOSIS — N522 Drug-induced erectile dysfunction: Secondary | ICD-10-CM

## 2015-09-25 DIAGNOSIS — L4050 Arthropathic psoriasis, unspecified: Secondary | ICD-10-CM

## 2015-09-25 DIAGNOSIS — I2510 Atherosclerotic heart disease of native coronary artery without angina pectoris: Secondary | ICD-10-CM

## 2015-09-25 NOTE — Progress Notes (Signed)
   Subjective:    Patient ID: [name], male    DOB: 1946-01-25, 70 y.o.   MRN: [id number]  HPI He is here for an interval evaluation. He continues on his Atripla and is having no trouble with that medication. His psoriatic arthritis is also seen to be under good control. He's had no chest pain, shortness of breath, DOE. He continues on his blood pressure medication and does note that when he skips a dose, he has less difficulty with erectile dysfunction. He will occasionally use Cialis. His allergies seem to be under good control. He does have a history of hyperlipidemia. His reflux disease causes very little difficulty. Cologuard was ordered on his last visit but apparently was not accomplished. I will reorder that. He has been under last stress dealing with a family owned hotel in the [place]. He has been doing a lot of work on this which has some quite frustrated. His home life is going well. 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   Review of Systems     Objective:   Physical Exam Alert and in no distress. Tympanic membranes and canals are normal. Pharyngeal area is normal. Neck is supple without adenopathy or thyromegaly. Cardiac exam shows a regular sinus rhythm without murmurs or gallops. Lungs are clear to auscultation. Abdominal exam shows no masses or tenderness.        Assessment & Plan:  HIV positive ([place]) - Plan: CBC with Differential/Platelet, Comprehensive metabolic panel, Lipid panel, HIV 1 RNA quant-no reflex-bld, T-helper cells (CD4) count (not at [facility])  Hyperlipidemia  Psoriatic arthritis ([place]) - Plan: CBC with Differential/Platelet, Comprehensive metabolic panel  ASHD (arteriosclerotic heart disease) - Plan: CBC with Differential/Platelet, Comprehensive metabolic panel, Lipid panel  Seasonal allergic rhinitis due to pollen  Gastroesophageal reflux disease without esophagitis  Screen for colon cancer - Plan: Cologuard  Need for  hepatitis C screening test - Plan: Hepatitis C antibody Continue on present medications. Encouraged him to continue to get professional help dealing with the hotel. He readily recognizes that that's not his area of expertise and is making appropriate steps to get professional guidance on this.

## 2015-09-26 LAB — COMPREHENSIVE METABOLIC PANEL
ALBUMIN: 4.4 g/dL (ref 3.6–5.1)
ALT: 14 U/L (ref 9–46)
AST: 14 U/L (ref 10–35)
Alkaline Phosphatase: 81 U/L (ref 40–115)
BUN: 13 mg/dL (ref 7–25)
CHLORIDE: 108 mmol/L (ref 98–110)
CO2: 20 mmol/L (ref 20–31)
CREATININE: 1.08 mg/dL (ref 0.70–1.25)
Calcium: 9.1 mg/dL (ref 8.6–10.3)
GLUCOSE: 108 mg/dL — AB (ref 65–99)
Potassium: 4 mmol/L (ref 3.5–5.3)
SODIUM: 138 mmol/L (ref 135–146)
Total Bilirubin: 0.4 mg/dL (ref 0.2–1.2)
Total Protein: 6.8 g/dL (ref 6.1–8.1)

## 2015-09-26 LAB — T-HELPER CELLS (CD4) COUNT (NOT AT ARMC)
Absolute CD4: 442 cells/uL (ref 381–1469)
CD4 T Helper %: 36 % (ref 32–62)
Total lymphocyte count: 1228 cells/uL (ref 700–3300)

## 2015-09-26 LAB — CBC WITH DIFFERENTIAL/PLATELET
Basophils Absolute: 0 cells/uL (ref 0–200)
Basophils Relative: 0 %
EOS PCT: 1 %
Eosinophils Absolute: 53 cells/uL (ref 15–500)
HCT: 45.9 % (ref 38.5–50.0)
Hemoglobin: 15.6 g/dL (ref 13.2–17.1)
LYMPHS ABS: 1166 {cells}/uL (ref 850–3900)
Lymphocytes Relative: 22 %
MCH: 31.6 pg (ref 27.0–33.0)
MCHC: 34 g/dL (ref 32.0–36.0)
MCV: 93.1 fL (ref 80.0–100.0)
MONO ABS: 265 {cells}/uL (ref 200–950)
MONOS PCT: 5 %
MPV: 9 fL (ref 7.5–12.5)
NEUTROS ABS: 3816 {cells}/uL (ref 1500–7800)
NEUTROS PCT: 72 %
PLATELETS: 148 10*3/uL (ref 140–400)
RBC: 4.93 MIL/uL (ref 4.20–5.80)
RDW: 14 % (ref 11.0–15.0)
WBC: 5.3 10*3/uL (ref 4.0–10.5)

## 2015-09-26 LAB — LIPID PANEL
CHOL/HDL RATIO: 5.1 ratio — AB (ref <=5.0)
CHOLESTEROL: 137 mg/dL (ref 125–200)
HDL: 27 mg/dL — ABNORMAL LOW (ref >=40)
LDL Cholesterol: 64 mg/dL (ref <130)
Triglycerides: 230 mg/dL — ABNORMAL HIGH (ref <150)
VLDL: 46 mg/dL — AB (ref <30)

## 2015-09-26 LAB — HIV-1 RNA QUANT-NO REFLEX-BLD: HIV 1 RNA Quant: <20 copies/mL (ref <20)

## 2015-09-26 LAB — HEPATITIS C ANTIBODY: HCV Ab: NEGATIVE

## 2015-10-09 LAB — COLOGUARD: COLOGUARD: NEGATIVE

## 2015-10-22 ENCOUNTER — Telehealth: Payer: Self-pay | Admitting: Family Medicine

## 2015-10-22 NOTE — Telephone Encounter (Signed)
Called pt & informed Cologuard results were negative & needs to repeat in 3 years

## 2015-10-23 ENCOUNTER — Ambulatory Visit (INDEPENDENT_AMBULATORY_CARE_PROVIDER_SITE_OTHER): Payer: Medicare Other | Admitting: Family Medicine

## 2015-10-23 ENCOUNTER — Other Ambulatory Visit: Payer: Self-pay | Admitting: Family Medicine

## 2015-10-23 ENCOUNTER — Encounter: Payer: Self-pay | Admitting: Family Medicine

## 2015-10-23 VITALS — BP 120/86 | HR 90

## 2015-10-23 DIAGNOSIS — L918 Other hypertrophic disorders of the skin: Secondary | ICD-10-CM

## 2015-10-23 DIAGNOSIS — D229 Melanocytic nevi, unspecified: Secondary | ICD-10-CM

## 2015-10-23 LAB — COLOGUARD: COLOGUARD: NEGATIVE

## 2015-10-23 MED ORDER — LIDOCAINE-EPINEPHRINE 2 %-1:100000 IJ SOLN
1.0000 mL | Freq: Once | INTRAMUSCULAR | Status: AC
Start: 2015-10-23 — End: 2015-10-23
  Administered 2015-10-23: 1 mL via INTRADERMAL

## 2015-10-23 NOTE — Progress Notes (Signed)
   Subjective:    Patient ID: [name], male    DOB: 04-Dec-1945, 70 y.o.   MRN: [id number]  HPI He has a 2 cm mole present on the left arm medial area that is constantly being rubbed and irritated due to clothing. He would like this removed.   Review of Systems     Objective:   Physical Exam 1 cm mole is noted in the left medial arm area.       Assessment & Plan:  Skin tag - Plan: lidocaine-EPINEPHrine (XYLOCAINE W/EPI) 2 %-1:100000 (with pres) injection 1 mL  Benign mole  The area was injected with Xylocaine and epinephrine. It was excised without difficulty and the base was hyfrecated.

## 2015-10-30 ENCOUNTER — Encounter: Payer: Self-pay | Admitting: Acute Care

## 2015-10-31 ENCOUNTER — Other Ambulatory Visit: Payer: Self-pay | Admitting: Family Medicine

## 2015-10-31 DIAGNOSIS — E785 Hyperlipidemia, unspecified: Secondary | ICD-10-CM

## 2015-11-02 ENCOUNTER — Telehealth: Payer: Self-pay

## 2015-11-02 NOTE — Telephone Encounter (Signed)
Error

## 2015-11-14 ENCOUNTER — Other Ambulatory Visit: Payer: Self-pay | Admitting: Family Medicine

## 2015-11-14 NOTE — Telephone Encounter (Signed)
Is this okay to refill? 

## 2016-03-26 ENCOUNTER — Ambulatory Visit: Payer: Medicare Other | Admitting: Family Medicine

## 2016-03-27 ENCOUNTER — Ambulatory Visit: Payer: Medicare Other | Admitting: Family Medicine

## 2016-04-03 ENCOUNTER — Ambulatory Visit (INDEPENDENT_AMBULATORY_CARE_PROVIDER_SITE_OTHER): Payer: Medicare Other | Admitting: Family Medicine

## 2016-04-03 ENCOUNTER — Encounter: Payer: Self-pay | Admitting: Family Medicine

## 2016-04-03 VITALS — BP 140/78 | HR 90 | Ht 68.0 in | Wt 147.4 lb

## 2016-04-03 DIAGNOSIS — I2510 Atherosclerotic heart disease of native coronary artery without angina pectoris: Secondary | ICD-10-CM | POA: Diagnosis not present

## 2016-04-03 DIAGNOSIS — Z21 Asymptomatic human immunodeficiency virus [HIV] infection status: Secondary | ICD-10-CM

## 2016-04-03 DIAGNOSIS — I10 Essential (primary) hypertension: Secondary | ICD-10-CM | POA: Diagnosis not present

## 2016-04-03 DIAGNOSIS — J301 Allergic rhinitis due to pollen: Secondary | ICD-10-CM | POA: Diagnosis not present

## 2016-04-03 DIAGNOSIS — G479 Sleep disorder, unspecified: Secondary | ICD-10-CM

## 2016-04-03 DIAGNOSIS — E785 Hyperlipidemia, unspecified: Secondary | ICD-10-CM

## 2016-04-03 DIAGNOSIS — Z23 Encounter for immunization: Secondary | ICD-10-CM

## 2016-04-03 DIAGNOSIS — L4050 Arthropathic psoriasis, unspecified: Secondary | ICD-10-CM | POA: Diagnosis not present

## 2016-04-03 LAB — CBC WITH DIFFERENTIAL/PLATELET
BASOS ABS: 0 {cells}/uL (ref 0–200)
BASOS PCT: 0 %
EOS ABS: 70 {cells}/uL (ref 15–500)
EOS PCT: 1 %
HCT: 46.8 % (ref 38.5–50.0)
Hemoglobin: 15.8 g/dL (ref 13.2–17.1)
LYMPHS PCT: 19 %
Lymphs Abs: 1330 cells/uL (ref 850–3900)
MCH: 31.3 pg (ref 27.0–33.0)
MCHC: 33.8 g/dL (ref 32.0–36.0)
MCV: 92.9 fL (ref 80.0–100.0)
MONOS PCT: 6 %
MPV: 9 fL (ref 7.5–12.5)
Monocytes Absolute: 420 cells/uL (ref 200–950)
Neutro Abs: 5180 cells/uL (ref 1500–7800)
Neutrophils Relative %: 74 %
PLATELETS: 165 10*3/uL (ref 140–400)
RBC: 5.04 MIL/uL (ref 4.20–5.80)
RDW: 13.9 % (ref 11.0–15.0)
WBC: 7 10*3/uL (ref 4.0–10.5)

## 2016-04-03 LAB — COMPREHENSIVE METABOLIC PANEL
ALT: 13 U/L (ref 9–46)
AST: 15 U/L (ref 10–35)
Albumin: 4.4 g/dL (ref 3.6–5.1)
Alkaline Phosphatase: 86 U/L (ref 40–115)
BUN: 17 mg/dL (ref 7–25)
CHLORIDE: 107 mmol/L (ref 98–110)
CO2: 20 mmol/L (ref 20–31)
Calcium: 9.4 mg/dL (ref 8.6–10.3)
Creat: 0.98 mg/dL (ref 0.70–1.18)
GLUCOSE: 111 mg/dL — AB (ref 65–99)
POTASSIUM: 4.3 mmol/L (ref 3.5–5.3)
Sodium: 139 mmol/L (ref 135–146)
Total Bilirubin: 0.3 mg/dL (ref 0.2–1.2)
Total Protein: 7.1 g/dL (ref 6.1–8.1)

## 2016-04-03 LAB — LIPID PANEL
CHOL/HDL RATIO: 5.5 ratio — AB (ref <5.0)
Cholesterol: 144 mg/dL (ref <200)
HDL: 26 mg/dL — ABNORMAL LOW (ref >40)
LDL CALC: 75 mg/dL (ref <100)
Triglycerides: 214 mg/dL — ABNORMAL HIGH (ref <150)
VLDL: 43 mg/dL — AB (ref <30)

## 2016-04-03 MED ORDER — ZOLPIDEM TARTRATE 5 MG PO TABS: 5.0000 mg | ORAL_TABLET | Freq: Every evening | ORAL | 0 refills | Status: DC | PRN

## 2016-04-03 NOTE — Progress Notes (Signed)
   Subjective:    Patient ID: [name], male    DOB: 09-Jun-1945, 71 y.o.   MRN: [id number]  HPI he is here for an interval evaluation. He continues on his HIV medications and is having no difficulty with them. He does occasionally require the use of Ambien to help with sleep especially when he travels. Presently he is not taking any other disease modifying agents for his psoriatic arthritis. He has had no chest pain, shortness of breath, PND or DOE. His allergies are under good control. He continues on Lipitor. He also uses Cialis rarely. His home life is going well. 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 .Review of Systems     Objective:   Physical Exam Alert and in no distress. Tympanic membranes and canals are normal. Pharyngeal area is normal. Neck is supple without adenopathy or thyromegaly. Cardiac exam shows a regular sinus rhythm without murmurs or gallops. Lungs are clear to auscultation. Abdominal exam shows no masses or tenderness. No axillary or inguinal adenopathy. Fundi are benign.       Assessment & Plan:  Need for prophylactic vaccination and inoculation against influenza - Plan: Flu vaccine HIGH DOSE PF (Fluzone High dose)  HIV positive ([place]) - Plan: CBC with Differential/Platelet, Comprehensive metabolic panel, Lipid panel, HIV 1 RNA quant-no reflex-bld, T-helper cells (CD4) count (not at [facility])  Hyperlipidemia, unspecified hyperlipidemia type - Plan: Lipid panel  Psoriatic arthritis ([place]) - Plan: CBC with Differential/Platelet, Comprehensive metabolic panel  ASHD (arteriosclerotic heart disease)  Seasonal allergic rhinitis due to pollen, unspecified chronicity  Sleep disturbance - Plan: zolpidem (AMBIEN) 5 MG tablet He is not interested in seeing rheumatology or following up at this point with cardiology as he feels very stable.  he will continue on present medication regimen.

## 2016-04-04 LAB — T-HELPER CELLS (CD4) COUNT (NOT AT ARMC)
Absolute CD4: 504 cells/uL (ref 381–1469)
CD4 T HELPER %: 36 % (ref 32–62)
Total lymphocyte count: 1408 cells/uL (ref 700–3300)

## 2016-04-07 LAB — HIV-1 RNA QUANT-NO REFLEX-BLD
HIV 1 RNA Quant: <20 copies/mL (ref <20)
HIV-1 RNA Quant, Log: <1.3 Log copies/mL (ref <1.30)

## 2016-04-07 MED ORDER — LOSARTAN POTASSIUM-HCTZ 50-12.5 MG PO TABS: 1.0000 | ORAL_TABLET | Freq: Every day | ORAL | 3 refills | Status: DC

## 2016-04-07 MED ORDER — LIPITOR 10 MG PO TABS: 10.0000 mg | ORAL_TABLET | Freq: Every day | ORAL | 3 refills | Status: DC

## 2016-04-07 MED ORDER — EFAVIRENZ-EMTRICITAB-TENOFOVIR 600-200-300 MG PO TABS: 1.0000 | ORAL_TABLET | Freq: Every day | ORAL | 3 refills | Status: DC

## 2016-04-07 NOTE — Addendum Note (Signed)
Addended by: Denita Lung on: 04/07/2016 08:13 AM   Modules accepted: Orders

## 2016-09-25 ENCOUNTER — Encounter: Payer: Self-pay | Admitting: Family Medicine

## 2016-09-25 ENCOUNTER — Ambulatory Visit (INDEPENDENT_AMBULATORY_CARE_PROVIDER_SITE_OTHER): Payer: Medicare Other | Admitting: Family Medicine

## 2016-09-25 VITALS — BP 120/76 | HR 70 | Ht 68.0 in | Wt 147.0 lb

## 2016-09-25 DIAGNOSIS — M7989 Other specified soft tissue disorders: Secondary | ICD-10-CM | POA: Insufficient documentation

## 2016-09-25 DIAGNOSIS — Z21 Asymptomatic human immunodeficiency virus [HIV] infection status: Secondary | ICD-10-CM | POA: Diagnosis not present

## 2016-09-25 DIAGNOSIS — L4050 Arthropathic psoriasis, unspecified: Secondary | ICD-10-CM | POA: Diagnosis not present

## 2016-09-25 DIAGNOSIS — I10 Essential (primary) hypertension: Secondary | ICD-10-CM | POA: Diagnosis not present

## 2016-09-25 DIAGNOSIS — M79631 Pain in right forearm: Secondary | ICD-10-CM | POA: Diagnosis not present

## 2016-09-25 DIAGNOSIS — E785 Hyperlipidemia, unspecified: Secondary | ICD-10-CM

## 2016-09-25 DIAGNOSIS — Z79899 Other long term (current) drug therapy: Secondary | ICD-10-CM

## 2016-09-25 LAB — CBC WITH DIFFERENTIAL/PLATELET
Basophils Absolute: 0 cells/uL (ref 0–200)
Basophils Relative: 0 %
EOS ABS: 63 {cells}/uL (ref 15–500)
Eosinophils Relative: 1 %
HEMATOCRIT: 44.6 % (ref 38.5–50.0)
Hemoglobin: 15.3 g/dL (ref 13.2–17.1)
Lymphocytes Relative: 19 %
Lymphs Abs: 1197 cells/uL (ref 850–3900)
MCH: 32 pg (ref 27.0–33.0)
MCHC: 34.3 g/dL (ref 32.0–36.0)
MCV: 93.3 fL (ref 80.0–100.0)
MONO ABS: 315 {cells}/uL (ref 200–950)
MPV: 9.3 fL (ref 7.5–12.5)
Monocytes Relative: 5 %
NEUTROS PCT: 75 %
Neutro Abs: 4725 cells/uL (ref 1500–7800)
Platelets: 159 10*3/uL (ref 140–400)
RBC: 4.78 MIL/uL (ref 4.20–5.80)
RDW: 14.1 % (ref 11.0–15.0)
WBC: 6.3 10*3/uL (ref 4.0–10.5)

## 2016-09-25 MED ORDER — BICTEGRAVIR-EMTRICITAB-TENOFOV 50-200-25 MG PO TABS: 1.0000 | ORAL_TABLET | Freq: Every day | ORAL | 3 refills | Status: DC

## 2016-09-25 NOTE — Progress Notes (Signed)
   Subjective:    Patient ID: [name], male    DOB: 28-Jun-1945, 71 y.o.   MRN: [id number]  HPI He is here for a six-month follow-up. He continues on Atripla and is having no difficulty with that. He is also taking atorvastatin for his lipids as well as losartan/HCTZ. He continues on Celebrex for treatment of his psoriatic arthritis. He does occasionally have difficulty with that. His main complaint today is right forearm swelling. No history of injury to that. No weakness, numbness or tingling. No fever, chills, weight change, sore throat, history of recent illness or injury. Social and family history as well as health maintenance and immunizations were reviewed.   Review of Systems     Objective:   Physical Exam Alert and in no distress. Tympanic membranes and canals are normal. Pharyngeal area is normal. Neck is supple without adenopathy or thyromegaly. Cardiac exam shows a regular sinus rhythm without murmurs or gallops. Lungs are clear to auscultation. Swelling from the elbow to the wrist is noted. Pulses are normal including raising the arm above the head. Normal sensation. No obvious lesions noted. Ulnar deviation noted of the fingers bilaterally with some joint enlargement noted as well. Right elbow shows good motion without pain but questionable effusion.       Assessment & Plan:  HIV positive ([place]) - Plan: CBC with Differential/Platelet, Comprehensive metabolic panel, Lipid panel, HIV 1 RNA quant-no reflex-bld, T-helper cells (CD4) count (not at [facility]), bictegravir-emtricitabine-tenofovir AF (BIKTARVY) 50-200-25 MG TABS tablet  Hyperlipidemia, unspecified hyperlipidemia type - Plan: Lipid panel  Psoriatic arthritis ([place])  Essential hypertension - Plan: CBC with Differential/Platelet, Comprehensive metabolic panel  Encounter for long-term (current) use of high-risk medication - Plan: CBC with Differential/Platelet, Comprehensive metabolic panel, Lipid panel, HIV 1 RNA  quant-no reflex-bld, T-helper cells (CD4) count (not at [facility])  Pain and swelling of right forearm - Plan: VAS [place] UPPER EXTREMITY VENOUS DUPLEX  Forearm swelling He has been on a couple of her quite some time and doing very well on that. It's time to switch to [employer] . Explained the risks and benefits of switching to this in terms of reducing possible side effects. He will continue on his other medications for the above diagnoses. I will order an ultrasound on his forearm to see if there is any vascular issues there. May possibly need to have him seen by peripheral vascular.

## 2016-09-26 LAB — LIPID PANEL
CHOLESTEROL: 143 mg/dL (ref <200)
HDL: 27 mg/dL — AB (ref >40)
LDL Cholesterol: 76 mg/dL (ref <100)
TRIGLYCERIDES: 199 mg/dL — AB (ref <150)
Total CHOL/HDL Ratio: 5.3 Ratio — ABNORMAL HIGH (ref <5.0)
VLDL: 40 mg/dL — ABNORMAL HIGH (ref <30)

## 2016-09-26 LAB — COMPREHENSIVE METABOLIC PANEL
ALK PHOS: 86 U/L (ref 40–115)
ALT: 14 U/L (ref 9–46)
AST: 17 U/L (ref 10–35)
Albumin: 4.2 g/dL (ref 3.6–5.1)
BILIRUBIN TOTAL: 0.3 mg/dL (ref 0.2–1.2)
BUN: 19 mg/dL (ref 7–25)
CALCIUM: 9.1 mg/dL (ref 8.6–10.3)
CO2: 16 mmol/L — ABNORMAL LOW (ref 20–31)
Chloride: 109 mmol/L (ref 98–110)
Creat: 1.11 mg/dL (ref 0.70–1.18)
Glucose, Bld: 104 mg/dL — ABNORMAL HIGH (ref 65–99)
Potassium: 4.3 mmol/L (ref 3.5–5.3)
Sodium: 141 mmol/L (ref 135–146)
Total Protein: 6.8 g/dL (ref 6.1–8.1)

## 2016-09-26 LAB — T-HELPER CELLS (CD4) COUNT (NOT AT ARMC)
ABSOLUTE CD4: 426 {cells}/uL — AB (ref 490–1740)
CD4 T HELPER %: 37 % (ref 30–61)
Total lymphocyte count: 1159 cells/uL (ref 850–3900)

## 2016-09-29 LAB — HIV-1 RNA QUANT-NO REFLEX-BLD
HIV 1 RNA Quant: <20 copies/mL
HIV-1 RNA Quant, Log: <1.3 Log copies/mL

## 2016-10-14 ENCOUNTER — Ambulatory Visit (HOSPITAL_COMMUNITY)
Admission: RE | Admit: 2016-10-14 | Discharge: 2016-10-14 | Disposition: A | Discharge status: Home / Self Care | Payer: Medicare Other | Source: Ambulatory Visit | Attending: Cardiovascular Disease | Admitting: Cardiovascular Disease

## 2016-10-14 DIAGNOSIS — I10 Essential (primary) hypertension: Secondary | ICD-10-CM | POA: Diagnosis not present

## 2016-10-14 DIAGNOSIS — M79631 Pain in right forearm: Secondary | ICD-10-CM | POA: Diagnosis present

## 2016-10-14 DIAGNOSIS — M7989 Other specified soft tissue disorders: Secondary | ICD-10-CM

## 2016-10-14 DIAGNOSIS — Z87891 Personal history of nicotine dependence: Secondary | ICD-10-CM | POA: Insufficient documentation

## 2016-12-10 ENCOUNTER — Telehealth: Payer: Self-pay | Admitting: Family Medicine

## 2016-12-10 DIAGNOSIS — L4050 Arthropathic psoriasis, unspecified: Secondary | ICD-10-CM

## 2016-12-10 MED ORDER — CELECOXIB 200 MG PO CAPS: 200.0000 mg | ORAL_CAPSULE | Freq: Two times a day (BID) | ORAL | 3 refills | Status: DC

## 2016-12-10 NOTE — Telephone Encounter (Signed)
Pt requesting refill on Celecoxib 200 mg to [facility]

## 2016-12-17 ENCOUNTER — Other Ambulatory Visit: Payer: Self-pay | Admitting: Family Medicine

## 2016-12-17 DIAGNOSIS — L4050 Arthropathic psoriasis, unspecified: Secondary | ICD-10-CM

## 2016-12-17 MED ORDER — CELECOXIB 200 MG PO CAPS: 200.0000 mg | ORAL_CAPSULE | Freq: Two times a day (BID) | ORAL | 3 refills | Status: DC

## 2017-03-04 ENCOUNTER — Other Ambulatory Visit: Payer: Self-pay | Admitting: Family Medicine

## 2017-03-24 ENCOUNTER — Ambulatory Visit: Payer: Medicare Other | Admitting: Family Medicine

## 2017-03-24 ENCOUNTER — Encounter: Payer: Self-pay | Admitting: Family Medicine

## 2017-03-24 ENCOUNTER — Other Ambulatory Visit: Payer: Self-pay

## 2017-03-24 VITALS — BP 140/86 | HR 83 | Wt 151.2 lb

## 2017-03-24 DIAGNOSIS — L4050 Arthropathic psoriasis, unspecified: Secondary | ICD-10-CM

## 2017-03-24 DIAGNOSIS — Z23 Encounter for immunization: Secondary | ICD-10-CM | POA: Diagnosis not present

## 2017-03-24 DIAGNOSIS — I10 Essential (primary) hypertension: Secondary | ICD-10-CM

## 2017-03-24 DIAGNOSIS — Z79899 Other long term (current) drug therapy: Secondary | ICD-10-CM

## 2017-03-24 DIAGNOSIS — E785 Hyperlipidemia, unspecified: Secondary | ICD-10-CM

## 2017-03-24 DIAGNOSIS — M79631 Pain in right forearm: Secondary | ICD-10-CM

## 2017-03-24 DIAGNOSIS — M7989 Other specified soft tissue disorders: Secondary | ICD-10-CM

## 2017-03-24 DIAGNOSIS — Z21 Asymptomatic human immunodeficiency virus [HIV] infection status: Secondary | ICD-10-CM | POA: Diagnosis not present

## 2017-03-24 DIAGNOSIS — G479 Sleep disorder, unspecified: Secondary | ICD-10-CM | POA: Diagnosis not present

## 2017-03-24 MED ORDER — BICTEGRAVIR-EMTRICITAB-TENOFOV 50-200-25 MG PO TABS: 1.0000 | ORAL_TABLET | Freq: Every day | ORAL | 3 refills | Status: DC

## 2017-03-24 MED ORDER — ZOLPIDEM TARTRATE 5 MG PO TABS: 5.0000 mg | ORAL_TABLET | Freq: Every evening | ORAL | 0 refills | Status: DC | PRN

## 2017-03-24 MED ORDER — LOSARTAN POTASSIUM-HCTZ 50-12.5 MG PO TABS: 1.0000 | ORAL_TABLET | Freq: Every day | ORAL | 3 refills | Status: DC

## 2017-03-24 MED ORDER — LIPITOR 10 MG PO TABS: 10.0000 mg | ORAL_TABLET | Freq: Every day | ORAL | 3 refills | Status: DC

## 2017-03-24 NOTE — Progress Notes (Signed)
   Subjective:    Patient ID: [name], male    DOB: 12-02-1945, 72 y.o.   MRN: [id number]  HPI He is here for an interval evaluation.  He was supposed to be taking Biktarvy but apparently never got the prescription sent to him and has been on a Atripla.  He has had no difficulty with that medication.  He continues on Lipitor and has no aches or pains other than from his psoriatic arthritis.  He continues have difficulty with intermittent right arm swelling.  He has had a previous Doppler study on it which was negative.  He does occasionally use Ambien to help with sleep.  He continues on his losartan/HCTZ without difficulty.  He does take Celebrex for his psoriatic arthritis.   Review of Systems     Objective:   Physical Exam Alert and in no distress. Tympanic membranes and canals are normal. Pharyngeal area is normal. Neck is supple without adenopathy or thyromegaly. Cardiac exam shows a regular sinus rhythm without murmurs or gallops. Lungs are clear to auscultation. Exam of the right arm does show extensive arthritic involvement of his hands with ulnar deviation.  He also has distal edema with some pitting in the distal forearm.  Pulses are normal.       Assessment & Plan:  HIV positive ([place]) - Plan: HIV 1 RNA quant-no reflex-bld, T-helper cells (CD4) count (not at [facility]), CBC with Differential/Platelet, Comprehensive metabolic panel, Lipid panel, bictegravir-emtricitabine-tenofovir AF (BIKTARVY) 50-200-25 MG TABS tablet  Need for influenza vaccination - Plan: Flu vaccine HIGH DOSE PF (Fluzone High dose)  Psoriatic arthritis ([place])  Hyperlipidemia, unspecified hyperlipidemia type - Plan: Lipid panel, LIPITOR 10 MG tablet  Essential hypertension - Plan: CBC with Differential/Platelet, Comprehensive metabolic panel, losartan-hydrochlorothiazide (HYZAAR) 50-12.5 MG tablet  Encounter for long-term (current) use of high-risk medication - Plan: CBC with Differential/Platelet,  Comprehensive metabolic panel, Lipid panel  Pain and swelling of right forearm - Plan: Ambulatory referral to Vascular Surgery  Sleep disturbance - Plan: zolpidem (AMBIEN) 5 MG tablet I will switch him to [employer].  His most recent blood work did look good.  He is to continue on all of his other medications. I will set him up to see vascular surgery to get their input into the swelling in his right arm.  It does seem to be venous in nature but the previous study was negative.

## 2017-03-26 LAB — LIPID PANEL
CHOL/HDL RATIO: 5.7 ratio — AB (ref 0.0–5.0)
Cholesterol, Total: 164 mg/dL (ref 100–199)
HDL: 29 mg/dL — AB (ref >39)
LDL Calculated: 97 mg/dL (ref 0–99)
TRIGLYCERIDES: 192 mg/dL — AB (ref 0–149)
VLDL CHOLESTEROL CAL: 38 mg/dL (ref 5–40)

## 2017-03-26 LAB — CBC WITH DIFFERENTIAL/PLATELET
BASOS: 0 %
Basophils Absolute: 0 10*3/uL (ref 0.0–0.2)
EOS (ABSOLUTE): 0 10*3/uL (ref 0.0–0.4)
Eos: 1 %
Hematocrit: 40.2 % (ref 37.5–51.0)
Hemoglobin: 13.4 g/dL (ref 13.0–17.7)
IMMATURE GRANS (ABS): 0 10*3/uL (ref 0.0–0.1)
IMMATURE GRANULOCYTES: 0 %
LYMPHS: 19 %
Lymphocytes Absolute: 0.8 10*3/uL (ref 0.7–3.1)
MCH: 29.7 pg (ref 26.6–33.0)
MCHC: 33.3 g/dL (ref 31.5–35.7)
MCV: 89 fL (ref 79–97)
MONOS ABS: 0.3 10*3/uL (ref 0.1–0.9)
Monocytes: 7 %
NEUTROS PCT: 73 %
Neutrophils Absolute: 3.2 10*3/uL (ref 1.4–7.0)
PLATELETS: 147 10*3/uL — AB (ref 150–379)
RBC: 4.51 x10E6/uL (ref 4.14–5.80)
RDW: 13.7 % (ref 12.3–15.4)
WBC: 4.3 10*3/uL (ref 3.4–10.8)

## 2017-03-26 LAB — COMPREHENSIVE METABOLIC PANEL
A/G RATIO: 1.8 (ref 1.2–2.2)
ALT: 14 IU/L (ref 0–44)
AST: 17 IU/L (ref 0–40)
Albumin: 4.5 g/dL (ref 3.5–4.8)
Alkaline Phosphatase: 106 IU/L (ref 39–117)
BUN/Creatinine Ratio: 16 (ref 10–24)
BUN: 18 mg/dL (ref 8–27)
Bilirubin Total: 0.3 mg/dL (ref 0.0–1.2)
CALCIUM: 9.2 mg/dL (ref 8.6–10.2)
CHLORIDE: 105 mmol/L (ref 96–106)
CO2: 22 mmol/L (ref 20–29)
Creatinine, Ser: 1.13 mg/dL (ref 0.76–1.27)
GFR, EST AFRICAN AMERICAN: 75 mL/min/{1.73_m2} (ref >59)
GFR, EST NON AFRICAN AMERICAN: 65 mL/min/{1.73_m2} (ref >59)
GLUCOSE: 111 mg/dL — AB (ref 65–99)
Globulin, Total: 2.5 g/dL (ref 1.5–4.5)
POTASSIUM: 4.5 mmol/L (ref 3.5–5.2)
Sodium: 142 mmol/L (ref 134–144)
TOTAL PROTEIN: 7 g/dL (ref 6.0–8.5)

## 2017-03-26 LAB — HIV-1 RNA QUANT-NO REFLEX-BLD

## 2017-03-27 ENCOUNTER — Telehealth: Payer: Self-pay

## 2017-03-27 NOTE — Telephone Encounter (Signed)
Pt has UE arterial duplex scheduled on 05-06-17. Do I need to let pt know ? [place]

## 2017-03-27 NOTE — Telephone Encounter (Signed)
yes

## 2017-03-30 NOTE — Telephone Encounter (Signed)
Did you let patient know? I was going to but I cannot find where/when this is scheduled. Thanks.

## 2017-03-30 NOTE — Telephone Encounter (Signed)
Pt was aware of appointment but just found out that he had to change the date due to [place]. Pt new appoinment is 05-20-17. Thanks [employer]

## 2017-04-09 ENCOUNTER — Other Ambulatory Visit: Payer: Medicare Other

## 2017-04-09 DIAGNOSIS — Z21 Asymptomatic human immunodeficiency virus [HIV] infection status: Secondary | ICD-10-CM

## 2017-04-09 DIAGNOSIS — E785 Hyperlipidemia, unspecified: Secondary | ICD-10-CM

## 2017-04-10 LAB — T-HELPER CELLS (CD4) COUNT (NOT AT ARMC)
% CD 4 POS. LYMPH.: 29 % — AB (ref 30.8–58.5)
ABSOLUTE CD 4 HELPER: 377 /uL (ref 359–1519)
BASOS ABS: 0 10*3/uL (ref 0.0–0.2)
Basos: 0 %
EOS (ABSOLUTE): 0.1 10*3/uL (ref 0.0–0.4)
Eos: 1 %
Hematocrit: 39.3 % (ref 37.5–51.0)
Hemoglobin: 13.5 g/dL (ref 13.0–17.7)
Immature Grans (Abs): 0 10*3/uL (ref 0.0–0.1)
Immature Granulocytes: 0 %
LYMPHS ABS: 1.3 10*3/uL (ref 0.7–3.1)
Lymphs: 27 %
MCH: 29.9 pg (ref 26.6–33.0)
MCHC: 34.4 g/dL (ref 31.5–35.7)
MCV: 87 fL (ref 79–97)
MONOS ABS: 0.4 10*3/uL (ref 0.1–0.9)
Monocytes: 8 %
Neutrophils Absolute: 3.2 10*3/uL (ref 1.4–7.0)
Neutrophils: 64 %
PLATELETS: 181 10*3/uL (ref 150–379)
RBC: 4.51 x10E6/uL (ref 4.14–5.80)
RDW: 13.1 % (ref 12.3–15.4)
WBC: 4.9 10*3/uL (ref 3.4–10.8)

## 2017-05-06 ENCOUNTER — Encounter: Payer: Medicare Other | Admitting: Vascular Surgery

## 2017-05-06 ENCOUNTER — Other Ambulatory Visit (HOSPITAL_COMMUNITY): Payer: Medicare Other

## 2017-05-20 ENCOUNTER — Ambulatory Visit (INDEPENDENT_AMBULATORY_CARE_PROVIDER_SITE_OTHER): Payer: Medicare Other | Admitting: Vascular Surgery

## 2017-05-20 ENCOUNTER — Ambulatory Visit (HOSPITAL_COMMUNITY)
Admission: RE | Admit: 2017-05-20 | Discharge: 2017-05-20 | Disposition: A | Discharge status: Home / Self Care | Payer: Medicare Other | Source: Ambulatory Visit | Attending: Vascular Surgery | Admitting: Vascular Surgery

## 2017-05-20 ENCOUNTER — Encounter: Payer: Self-pay | Admitting: Vascular Surgery

## 2017-05-20 ENCOUNTER — Other Ambulatory Visit: Payer: Self-pay

## 2017-05-20 VITALS — BP 123/76 | HR 86 | Temp 98.1°F | Resp 16 | Ht 68.0 in | Wt 148.0 lb

## 2017-05-20 DIAGNOSIS — M25521 Pain in right elbow: Secondary | ICD-10-CM | POA: Diagnosis not present

## 2017-05-20 DIAGNOSIS — M7989 Other specified soft tissue disorders: Secondary | ICD-10-CM

## 2017-05-20 NOTE — Progress Notes (Signed)
Patient name: [name] MRN: [id number] DOB: June 30, 1945 Sex: male   REASON FOR CONSULT:    Pain and swelling in right forearm.  The consult is requested by Dr. Mayra Reel.   HPI:   [name] is a pleasant 72 y.o. male, who had developed some right forearm pain last spring.  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  He also is been doing a lot of spray washing and is placed in [facility] when he had a exacerbation of his symptoms.  He is unaware of any previous history of DVT.  He does have significant arthritis bilaterally.  He denies any claudication-like symptoms in either upper extremity or in his lower extremities.  His risk factors for peripheral vascular disease include a strong family history of premature cardiovascular disease and a remote history of tobacco use.  He denies any history of diabetes, hypertension, or hypercholesterolemia.  He underwent coronary revascularization in 2001.  Past Medical History:  Diagnosis Date  . Allergy    RHINITIS  . ASHD (arteriosclerotic heart disease)   . Former smoker QUIT 08/09/2010  . Herpes labialis   . HH (hiatus hernia)   . HIV infection ([name])    POSITIVE  . Hypertriglyceridemia   . Psoriasis    ARTHRITIS  . Renal stones   . Tinnitus    CHRONIC    No family history on file.  SOCIAL HISTORY: Social History   Socioeconomic History  . Marital status: Single    Spouse name: Not on file  . Number of children: Not on file  . Years of education: Not on file  . Highest education level: Not on file  Social Needs  . Financial resource strain: Not on file  . Food insecurity - worry: Not on file  . Food insecurity - inability: Not on file  . Transportation needs - medical: Not on file  . Transportation needs - non-medical: Not on file  Occupational History  . Not on file  Tobacco Use  .  Smoking status: Former Smoker    Types: Cigarettes    Last attempt to quit: 08/09/2010    Years since quitting: 6.7  . Smokeless tobacco: Never Used  Substance and Sexual Activity  . Alcohol use: Yes    Alcohol/week: 2.4 oz    Types: 4 Glasses of wine per week  . Drug use: No  . Sexual activity: Yes  Other Topics Concern  . Not on file  Social History Narrative  . Not on file    No Known Allergies  Current Outpatient Medications  Medication Sig Dispense Refill  . aspirin 325 MG tablet Take 325 mg by mouth daily.    . bictegravir-emtricitabine-tenofovir AF (BIKTARVY) 50-200-25 MG TABS tablet Take 1 tablet by mouth daily. 90 tablet 3  . celecoxib (CELEBREX) 200 MG capsule Take 1 capsule (200 mg total) by mouth 2 (two) times daily. 180 capsule 3  . LIPITOR 10 MG tablet Take 1 tablet (10 mg total) by mouth daily. 90 tablet 3  . losartan-hydrochlorothiazide (HYZAAR) 50-12.5 MG tablet Take 1 tablet by mouth daily. 90 tablet 3  . nitroGLYCERIN (NITROSTAT) 0.3 MG SL tablet Place 1 tablet (0.3 mg total) under the tongue every 5 (five) minutes as needed for chest pain. 90 tablet 1  . tadalafil (CIALIS) 5 MG tablet Take 1 tablet (5 mg total) by mouth  daily as needed for erectile dysfunction. 30 tablet 0  . zolpidem (AMBIEN) 5 MG tablet Take 1 tablet (5 mg total) by mouth at bedtime as needed for sleep. 30 tablet 0   No current facility-administered medications for this visit.     REVIEW OF SYSTEMS:  [X]  denotes positive finding, [ ]  denotes negative finding Cardiac  Comments:  Chest pain or chest pressure:    Shortness of breath upon exertion:    Short of breath when lying flat:    Irregular heart rhythm:        Vascular    Pain in calf, thigh, or hip brought on by ambulation:    Pain in feet at night that wakes you up from your sleep:     Blood clot in your veins:    Leg swelling:         Pulmonary    Oxygen at home:    Productive cough:     Wheezing:         Neurologic      Sudden weakness in arms or legs:     Sudden numbness in arms or legs:     Sudden onset of difficulty speaking or slurred speech:    Temporary loss of vision in one eye:     Problems with dizziness:         Gastrointestinal    Blood in stool:     Vomited blood:         Genitourinary    Burning when urinating:     Blood in urine:        Psychiatric    Major depression:         Hematologic    Bleeding problems:    Problems with blood clotting too easily:        Skin    Rashes or ulcers:        Constitutional    Fever or chills:     PHYSICAL EXAM:   Vitals:   05/20/17 1137  BP: 123/76  Pulse: 86  Resp: 16  Temp: 98.1 F (36.7 C)  TempSrc: Oral  SpO2: 97%  Weight: 148 lb (67.1 kg)  Height: 5\' 8"  (1.727 m)    GENERAL: The patient is a well-nourished male, in no acute distress. The vital signs are documented above. CARDIAC: There is a regular rate and rhythm.  VASCULAR: I do not detect carotid bruits. He has normal radial pulses bilaterally. He has palpable femoral, popliteal, and pedal pulses bilaterally. PULMONARY: There is good air exchange bilaterally without wheezing or rales. ABDOMEN: Soft and non-tender with normal pitched bowel sounds.  MUSCULOSKELETAL: There are no major deformities or cyanosis. NEUROLOGIC: No focal weakness or paresthesias are detected. SKIN: There are no ulcers or rashes noted. PSYCHIATRIC: The patient has a normal affect.  DATA:    ARTERIAL DUPLEX STUDY: I have independently interpreted his arterial Doppler study.  He has triphasic Doppler signals in the right upper extremity in the radial, ulnar, and brachial positions.  He has no areas of stenosis noted in the right upper extremity.   MEDICAL ISSUES:   RIGHT FOREARM PAIN: I think his pain is likely musculoskeletal in origin given his history.  He has no evidence of DVT and no evidence of arterial insufficiency.  I will be happy to see him back at any time if any new vascular issues  arise.  Deitra Mayo [facility] [phone]

## 2017-05-27 ENCOUNTER — Telehealth: Payer: Self-pay | Admitting: Family Medicine

## 2017-05-27 NOTE — Telephone Encounter (Signed)
Call him and have him schedule an appointment with me so I can take a look from a different perspective

## 2017-05-27 NOTE — Telephone Encounter (Signed)
Pt wants to know what Dr Redmond School want to do as a next step about his arm now that his vascular-ultrasound test did not show a blockage. Pt said he is wondering if the swelling is arthritis and maybe he need med for inflammation. Pt requesting a call back and not a message on MyChart.

## 2017-05-27 NOTE — Telephone Encounter (Signed)
Pt was notified and coming in Friday afternoon to follow-up on this

## 2017-05-29 ENCOUNTER — Encounter: Payer: Self-pay | Admitting: Family Medicine

## 2017-05-29 ENCOUNTER — Ambulatory Visit: Payer: Medicare Other | Admitting: Family Medicine

## 2017-05-29 VITALS — BP 118/78 | HR 78 | Wt 150.8 lb

## 2017-05-29 DIAGNOSIS — L4050 Arthropathic psoriasis, unspecified: Secondary | ICD-10-CM | POA: Diagnosis not present

## 2017-05-29 DIAGNOSIS — M79631 Pain in right forearm: Secondary | ICD-10-CM | POA: Diagnosis not present

## 2017-05-29 DIAGNOSIS — M7989 Other specified soft tissue disorders: Secondary | ICD-10-CM

## 2017-05-29 NOTE — Progress Notes (Signed)
   Subjective:    Patient ID: [name], male    DOB: December 26, 1945, 72 y.o.   MRN: [id number]  HPI He is here for a recheck.  He was seen by vascular surgery.  That note was reviewed.  It showed no evidence of any arterial issues.  There is question of musculoskeletal involvement.  He does state that the forearm swelling does tend to come and go and is sometimes related to physical activities.  He states that he thinks that his blood pressure/fluid pill as well as the Celebrex does help with this.   Review of Systems     Objective:   Physical Exam Alert and in no distress.  Obvious deformities noted of his hands.  The right forearm does seem to be slightly edematous but is not hot red or tender.       Assessment & Plan:  Psoriatic arthritis ([place])  Pain and swelling of right forearm I explained that I think we have ruled out the worst thing in terms of her heart arterial issue.  This could be a venous problem but does tend to come and go.  He is comfortable with continuing on Celebrex and his blood pressure medication.

## 2017-09-29 ENCOUNTER — Ambulatory Visit: Payer: Medicare Other | Admitting: Family Medicine

## 2017-09-29 ENCOUNTER — Encounter: Payer: Self-pay | Admitting: Family Medicine

## 2017-09-29 VITALS — BP 122/68 | HR 79 | Temp 97.3°F | Wt 149.6 lb

## 2017-09-29 DIAGNOSIS — I10 Essential (primary) hypertension: Secondary | ICD-10-CM | POA: Diagnosis not present

## 2017-09-29 DIAGNOSIS — Z79899 Other long term (current) drug therapy: Secondary | ICD-10-CM | POA: Diagnosis not present

## 2017-09-29 DIAGNOSIS — E785 Hyperlipidemia, unspecified: Secondary | ICD-10-CM | POA: Diagnosis not present

## 2017-09-29 DIAGNOSIS — L4050 Arthropathic psoriasis, unspecified: Secondary | ICD-10-CM

## 2017-09-29 DIAGNOSIS — Z21 Asymptomatic human immunodeficiency virus [HIV] infection status: Secondary | ICD-10-CM

## 2017-09-29 DIAGNOSIS — I2510 Atherosclerotic heart disease of native coronary artery without angina pectoris: Secondary | ICD-10-CM | POA: Diagnosis not present

## 2017-09-29 MED ORDER — IBUPROFEN 800 MG PO TABS: 800.0000 mg | ORAL_TABLET | Freq: Three times a day (TID) | ORAL | 1 refills | Status: DC | PRN

## 2017-09-29 NOTE — Progress Notes (Signed)
   Subjective:    Patient ID: [name], male    DOB: 1945-05-03, 72 y.o.   MRN: [id number]  HPI He is here for a recheck.  He did have a recent vascular evaluation for difficulty with his arm but the exam was negative.  He is having less difficulty with that.  He continues on [place] and is having no difficulty with that.  He did state that the dreams have gone away.  Does occasionally have difficulty with reflux and is taking Pepcid for that.  The psoriatic arthritis seems to be under adequate control and he does occasionally use an NSAID.  He states that ibuprofen works better than Celebrex.  Continues on 10 mg of Lipitor and does occasionally skip doses.  Last LDL was 97 continues on his blood pressure medications.  He has had no chest pain, shortness of breath, PND.  No fever, chills, weight changes, headaches.   Review of Systems     Objective:   Physical Exam Alert and in no distress. Tympanic membranes and canals are normal. Pharyngeal area is normal. Neck is supple without adenopathy or thyromegaly. Cardiac exam shows a regular sinus rhythm without murmurs or gallops. Lungs are clear to auscultation.  Abdominal exam shows no hepatosplenomegaly, masses or tenderness.  No axillary or inguinal adenopathy.       Assessment & Plan:  HIV positive ([place]) - Plan: CBC with Differential/Platelet, Comprehensive metabolic panel, Lipid panel, T-helper cells (CD4) count (not at [facility]), HIV 1 RNA quant-no reflex-bld  Hyperlipidemia, unspecified hyperlipidemia type - Plan: Lipid panel  Psoriatic arthritis ([place]) - Plan: CBC with Differential/Platelet, Comprehensive metabolic panel, ibuprofen (ADVIL,MOTRIN) 800 MG tablet  Essential hypertension - Plan: CBC with Differential/Platelet, Comprehensive metabolic panel  ASHD (arteriosclerotic heart disease) - Plan: CBC with Differential/Platelet, Comprehensive metabolic panel, Lipid panel  Encounter for long-term (current) use of high-risk  medication - Plan: CBC with Differential/Platelet, Comprehensive metabolic panel, Lipid panel I will have him double the Lipitor to 20 mg.  He will use up what he has 10 mg by doubling them and call me that he is about to run out.  Also will call in ibuprofen.  Cautioned on using this sparingly to control his arthritic symptoms.

## 2017-09-30 ENCOUNTER — Encounter: Payer: Self-pay | Admitting: *Deleted

## 2017-09-30 LAB — COMPREHENSIVE METABOLIC PANEL
ALBUMIN: 4.7 g/dL (ref 3.5–4.8)
ALT: 20 IU/L (ref 0–44)
AST: 18 IU/L (ref 0–40)
Albumin/Globulin Ratio: 2 (ref 1.2–2.2)
Alkaline Phosphatase: 72 IU/L (ref 39–117)
BUN / CREAT RATIO: 13 (ref 10–24)
BUN: 21 mg/dL (ref 8–27)
Bilirubin Total: 0.4 mg/dL (ref 0.0–1.2)
CALCIUM: 9.6 mg/dL (ref 8.6–10.2)
CO2: 21 mmol/L (ref 20–29)
CREATININE: 1.68 mg/dL — AB (ref 0.76–1.27)
Chloride: 102 mmol/L (ref 96–106)
GFR calc Af Amer: 47 mL/min/{1.73_m2} — ABNORMAL LOW (ref >59)
GFR calc non Af Amer: 40 mL/min/{1.73_m2} — ABNORMAL LOW (ref >59)
GLOBULIN, TOTAL: 2.3 g/dL (ref 1.5–4.5)
Glucose: 108 mg/dL — ABNORMAL HIGH (ref 65–99)
Potassium: 4 mmol/L (ref 3.5–5.2)
Sodium: 140 mmol/L (ref 134–144)
Total Protein: 7 g/dL (ref 6.0–8.5)

## 2017-09-30 LAB — LIPID PANEL
CHOLESTEROL TOTAL: 146 mg/dL (ref 100–199)
Chol/HDL Ratio: 5.4 ratio — ABNORMAL HIGH (ref 0.0–5.0)
HDL: 27 mg/dL — ABNORMAL LOW (ref >39)
LDL CALC: 75 mg/dL (ref 0–99)
Triglycerides: 222 mg/dL — ABNORMAL HIGH (ref 0–149)
VLDL CHOLESTEROL CAL: 44 mg/dL — AB (ref 5–40)

## 2017-09-30 LAB — T-HELPER CELLS (CD4) COUNT (NOT AT ARMC)
% CD 4 Pos. Lymph.: 36.2 % (ref 30.8–58.5)
Absolute CD 4 Helper: 471 /uL (ref 359–1519)
Basophils Absolute: 0 10*3/uL (ref 0.0–0.2)
Basos: 0 %
EOS (ABSOLUTE): 0.1 10*3/uL (ref 0.0–0.4)
EOS: 2 %
HEMATOCRIT: 45.5 % (ref 37.5–51.0)
Hemoglobin: 15.9 g/dL (ref 13.0–17.7)
Immature Grans (Abs): 0 10*3/uL (ref 0.0–0.1)
Immature Granulocytes: 0 %
LYMPHS ABS: 1.3 10*3/uL (ref 0.7–3.1)
Lymphs: 23 %
MCH: 30.4 pg (ref 26.6–33.0)
MCHC: 34.9 g/dL (ref 31.5–35.7)
MCV: 87 fL (ref 79–97)
MONOCYTES: 8 %
Monocytes Absolute: 0.4 10*3/uL (ref 0.1–0.9)
NEUTROS ABS: 3.9 10*3/uL (ref 1.4–7.0)
Neutrophils: 67 %
Platelets: 160 10*3/uL (ref 150–450)
RBC: 5.23 x10E6/uL (ref 4.14–5.80)
RDW: 15.9 % — AB (ref 12.3–15.4)
WBC: 5.8 10*3/uL (ref 3.4–10.8)

## 2017-09-30 LAB — HIV-1 RNA QUANT-NO REFLEX-BLD: HIV-1 RNA Viral Load: <20 copies/mL

## 2017-11-11 ENCOUNTER — Other Ambulatory Visit: Payer: Self-pay | Admitting: Family Medicine

## 2017-11-11 DIAGNOSIS — Z21 Asymptomatic human immunodeficiency virus [HIV] infection status: Secondary | ICD-10-CM

## 2017-11-11 NOTE — Telephone Encounter (Signed)
[facility] Is requesting to fill pt Biktarvy. Please advise [facility]

## 2018-03-18 ENCOUNTER — Ambulatory Visit: Payer: Medicare Other | Admitting: Family Medicine

## 2018-03-18 ENCOUNTER — Encounter: Payer: Self-pay | Admitting: Family Medicine

## 2018-03-18 VITALS — BP 112/80 | HR 75 | Temp 97.5°F | Wt 147.4 lb

## 2018-03-18 DIAGNOSIS — Z21 Asymptomatic human immunodeficiency virus [HIV] infection status: Secondary | ICD-10-CM

## 2018-03-18 DIAGNOSIS — E785 Hyperlipidemia, unspecified: Secondary | ICD-10-CM | POA: Diagnosis not present

## 2018-03-18 DIAGNOSIS — Z23 Encounter for immunization: Secondary | ICD-10-CM

## 2018-03-18 DIAGNOSIS — J301 Allergic rhinitis due to pollen: Secondary | ICD-10-CM

## 2018-03-18 DIAGNOSIS — G479 Sleep disorder, unspecified: Secondary | ICD-10-CM

## 2018-03-18 DIAGNOSIS — N522 Drug-induced erectile dysfunction: Secondary | ICD-10-CM

## 2018-03-18 DIAGNOSIS — Z79899 Other long term (current) drug therapy: Secondary | ICD-10-CM

## 2018-03-18 DIAGNOSIS — L4050 Arthropathic psoriasis, unspecified: Secondary | ICD-10-CM

## 2018-03-18 DIAGNOSIS — N200 Calculus of kidney: Secondary | ICD-10-CM | POA: Diagnosis not present

## 2018-03-18 DIAGNOSIS — I10 Essential (primary) hypertension: Secondary | ICD-10-CM

## 2018-03-18 DIAGNOSIS — K219 Gastro-esophageal reflux disease without esophagitis: Secondary | ICD-10-CM

## 2018-03-18 DIAGNOSIS — I2510 Atherosclerotic heart disease of native coronary artery without angina pectoris: Secondary | ICD-10-CM

## 2018-03-18 MED ORDER — LOSARTAN POTASSIUM-HCTZ 50-12.5 MG PO TABS: 1.0000 | ORAL_TABLET | Freq: Every day | ORAL | 3 refills | Status: DC

## 2018-03-18 MED ORDER — ZOLPIDEM TARTRATE 5 MG PO TABS: 5.0000 mg | ORAL_TABLET | Freq: Every evening | ORAL | 0 refills | Status: DC | PRN

## 2018-03-18 MED ORDER — BICTEGRAVIR-EMTRICITAB-TENOFOV 50-200-25 MG PO TABS: 1.0000 | ORAL_TABLET | Freq: Every day | ORAL | 3 refills | Status: DC

## 2018-03-18 MED ORDER — LIPITOR 10 MG PO TABS: 10.0000 mg | ORAL_TABLET | Freq: Every day | ORAL | 3 refills | Status: DC

## 2018-03-18 NOTE — Progress Notes (Signed)
   Subjective:    Patient ID: [name], male    DOB: Mar 18, 1945, 73 y.o.   MRN: [id number]  HPI He is here for an interval evaluation.  Continues on [place] and is having no difficulty with that.  He is also taking Lipitor for his lipids and does have underlying heart disease but has had no chest pain, shortness of breath.  Continues on losartan/HCTZ without difficulty.  Would like to continue to use Ambien on an as-needed basis.  He has been under stress dealing with his sister who apparently continues to cause stress in the family.  He does have a remote history of kidney stones but none recently.  His psoriasis seems to be under good control.  He does have some back pain but mainly in the morning it goes away fairly quickly.  Reflux causing no difficulty.  He is not very sexually active and therefore does not need Cialis at the present time.   Review of Systems     Objective:   Physical Exam Alert and in no distress. Tympanic membranes and canals are normal. Pharyngeal area is normal. Neck is supple without adenopathy or thyromegaly. Cardiac exam shows a regular sinus rhythm without murmurs or gallops. Lungs are clear to auscultation. Ulnar deviation of his digits are noted bilaterally.       Assessment & Plan:  HIV positive ([place]) - Plan: CBC with Differential/Platelet, Comprehensive metabolic panel, Lipid panel, T-helper cells (CD4) count (not at [facility]), HIV-1 RNA quant-no reflex-bld  Hyperlipidemia, unspecified hyperlipidemia type - Plan: Lipid panel  Kidney stones - Plan: CBC with Differential/Platelet, Comprehensive metabolic panel  Psoriatic arthritis ([place]) - Plan: CBC with Differential/Platelet, Comprehensive metabolic panel  Seasonal allergic rhinitis due to pollen  Gastroesophageal reflux disease without esophagitis  ASHD (arteriosclerotic heart disease) - Plan: CBC with Differential/Platelet, Comprehensive metabolic panel, Lipid panel  Drug-induced erectile  dysfunction  Need for influenza vaccination - Plan: Flu vaccine HIGH DOSE PF (Fluzone High dose)  Encounter for long-term (current) use of high-risk medication - Plan: CBC with Differential/Platelet, Comprehensive metabolic panel, Lipid panel, T-helper cells (CD4) count (not at [facility]), HIV-1 RNA quant-no reflex-bld

## 2018-03-20 LAB — HIV-1 RNA QUANT-NO REFLEX-BLD

## 2018-03-20 LAB — T-HELPER CELLS (CD4) COUNT (NOT AT ARMC)
% CD 4 POS. LYMPH.: 35.4 % (ref 30.8–58.5)
ABSOLUTE CD 4 HELPER: 496 /uL (ref 359–1519)
BASOS ABS: 0 10*3/uL (ref 0.0–0.2)
Basos: 1 %
EOS (ABSOLUTE): 0.1 10*3/uL (ref 0.0–0.4)
Eos: 2 %
HEMATOCRIT: 51.5 % — AB (ref 37.5–51.0)
Hemoglobin: 17.6 g/dL (ref 13.0–17.7)
Immature Grans (Abs): 0 10*3/uL (ref 0.0–0.1)
Immature Granulocytes: 0 %
LYMPHS ABS: 1.4 10*3/uL (ref 0.7–3.1)
Lymphs: 27 %
MCH: 31.7 pg (ref 26.6–33.0)
MCHC: 34.2 g/dL (ref 31.5–35.7)
MCV: 93 fL (ref 79–97)
MONOCYTES: 7 %
MONOS ABS: 0.4 10*3/uL (ref 0.1–0.9)
Neutrophils Absolute: 3.5 10*3/uL (ref 1.4–7.0)
Neutrophils: 63 %
PLATELETS: 159 10*3/uL (ref 150–450)
RBC: 5.55 x10E6/uL (ref 4.14–5.80)
RDW: 13.1 % (ref 11.6–15.4)
WBC: 5.4 10*3/uL (ref 3.4–10.8)

## 2018-03-20 LAB — COMPREHENSIVE METABOLIC PANEL
ALBUMIN: 4.6 g/dL (ref 3.5–4.8)
ALK PHOS: 73 IU/L (ref 39–117)
ALT: 12 IU/L (ref 0–44)
AST: 15 IU/L (ref 0–40)
Albumin/Globulin Ratio: 1.9 (ref 1.2–2.2)
BUN / CREAT RATIO: 9 — AB (ref 10–24)
BUN: 13 mg/dL (ref 8–27)
Bilirubin Total: 0.6 mg/dL (ref 0.0–1.2)
CO2: 21 mmol/L (ref 20–29)
CREATININE: 1.52 mg/dL — AB (ref 0.76–1.27)
Calcium: 9.6 mg/dL (ref 8.6–10.2)
Chloride: 104 mmol/L (ref 96–106)
GFR calc non Af Amer: 45 mL/min/{1.73_m2} — ABNORMAL LOW (ref >59)
GFR, EST AFRICAN AMERICAN: 52 mL/min/{1.73_m2} — AB (ref >59)
GLOBULIN, TOTAL: 2.4 g/dL (ref 1.5–4.5)
Glucose: 99 mg/dL (ref 65–99)
Potassium: 4.3 mmol/L (ref 3.5–5.2)
SODIUM: 142 mmol/L (ref 134–144)
TOTAL PROTEIN: 7 g/dL (ref 6.0–8.5)

## 2018-03-20 LAB — LIPID PANEL
CHOL/HDL RATIO: 7.2 ratio — AB (ref 0.0–5.0)
CHOLESTEROL TOTAL: 195 mg/dL (ref 100–199)
HDL: 27 mg/dL — ABNORMAL LOW (ref >39)
LDL CALC: 113 mg/dL — AB (ref 0–99)
Triglycerides: 275 mg/dL — ABNORMAL HIGH (ref 0–149)
VLDL Cholesterol Cal: 55 mg/dL — ABNORMAL HIGH (ref 5–40)

## 2018-03-21 ENCOUNTER — Telehealth: Payer: Self-pay | Admitting: Family Medicine

## 2018-03-21 NOTE — Telephone Encounter (Signed)
P.A. ZOLPIDEM °

## 2018-03-22 ENCOUNTER — Telehealth: Payer: Self-pay

## 2018-03-22 DIAGNOSIS — L4050 Arthropathic psoriasis, unspecified: Secondary | ICD-10-CM

## 2018-03-22 MED ORDER — CELECOXIB 200 MG PO CAPS: 200.0000 mg | ORAL_CAPSULE | Freq: Two times a day (BID) | ORAL | 3 refills | Status: DC

## 2018-03-22 NOTE — Telephone Encounter (Signed)
Pt is requesting to fill celecoxib 200 mg 1 tablet po bid. Please advise [facility]

## 2018-03-22 NOTE — Addendum Note (Signed)
Addended by: Denita Lung on: 03/22/2018 02:35 PM   Modules accepted: Orders

## 2018-03-27 NOTE — Telephone Encounter (Signed)
P.A. denied, pt must have trial and failure of both Belsomra and Rozerem, do you want to switch?

## 2018-03-28 ENCOUNTER — Other Ambulatory Visit: Payer: Self-pay | Admitting: Family Medicine

## 2018-03-28 MED ORDER — SUVOREXANT 10 MG PO TABS: 1.0000 | ORAL_TABLET | Freq: Every evening | ORAL | 0 refills | Status: DC | PRN

## 2018-03-28 NOTE — Telephone Encounter (Signed)
Let him know that his insurance will not pay for the Ambien.  I will call in the [place]

## 2018-03-29 NOTE — Telephone Encounter (Signed)
Pt was advise [facility] 

## 2018-03-31 ENCOUNTER — Telehealth: Payer: Self-pay

## 2018-03-31 NOTE — Telephone Encounter (Signed)
Called pt to schedule pt mwv. No answer LVM. [place]

## 2018-04-01 ENCOUNTER — Ambulatory Visit: Payer: Medicare Other | Admitting: Family Medicine

## 2018-04-01 ENCOUNTER — Encounter: Payer: Self-pay | Admitting: Family Medicine

## 2018-04-01 VITALS — BP 118/72 | HR 94 | Temp 98.0°F | Wt 143.4 lb

## 2018-04-01 DIAGNOSIS — R42 Dizziness and giddiness: Secondary | ICD-10-CM | POA: Diagnosis not present

## 2018-04-01 DIAGNOSIS — B001 Herpesviral vesicular dermatitis: Secondary | ICD-10-CM | POA: Diagnosis not present

## 2018-04-01 MED ORDER — VALACYCLOVIR HCL 1 G PO TABS: ORAL_TABLET | ORAL | 0 refills | Status: DC

## 2018-04-01 NOTE — Progress Notes (Signed)
   Subjective:    Patient ID: [name], male    DOB: 11-Feb-1946, 73 y.o.   MRN: [id number]  HPI He is here for consult concerning a 10-second episode last Friday of double vision and dizziness but no associated blurred vision, nausea, vomiting, weakness, heart rate change, chest pain, diaphoresis, shortness of breath, headache.  Yesterday he did have one small episode of feeling as if he was going to get dizzy but then did not. He would also like a refill on his Valtrex.  He is getting ready to travel down to the [place].  Review of Systems     Objective:   Physical Exam Alert and in no distress.  EOMI.  Other cranial nerves grossly intact.  Cardiac exam shows regular rhythm without murmurs or gallops.  Lungs are clear to auscultation.  Normal reflexes and cerebellar.       Assessment & Plan:  Dizziness  Herpes labialis - Plan: valACYclovir (VALTREX) 1000 MG tablet I reassured him that I did not see anything of major significance.  Discussed the fact that the short timeframe that this occurred eliminated a lot of red flag problems including stroke, bleed, tumors, cardiac issues.  Did not limit him in terms of his physical activities.  If this reoccurs, he will keep track of this and get reevaluated.

## 2018-04-04 NOTE — Telephone Encounter (Signed)
Pt was switched to [employer]

## 2018-05-30 ENCOUNTER — Other Ambulatory Visit: Payer: Self-pay | Admitting: Family Medicine

## 2018-05-30 DIAGNOSIS — L4050 Arthropathic psoriasis, unspecified: Secondary | ICD-10-CM

## 2018-05-31 NOTE — Telephone Encounter (Signed)
[facility] is requesting to fill pt ibuprofen. Please advise [facility] 

## 2018-07-11 ENCOUNTER — Other Ambulatory Visit: Payer: Self-pay | Admitting: Family Medicine

## 2018-07-11 DIAGNOSIS — L4050 Arthropathic psoriasis, unspecified: Secondary | ICD-10-CM

## 2018-07-12 NOTE — Telephone Encounter (Signed)
[facility] is requesting to fill pt Ibuprfen 800. Please advise [facility]

## 2018-07-28 ENCOUNTER — Other Ambulatory Visit: Payer: Self-pay

## 2018-07-28 ENCOUNTER — Ambulatory Visit: Payer: Medicare Other | Admitting: Family Medicine

## 2018-07-28 ENCOUNTER — Encounter: Payer: Self-pay | Admitting: Family Medicine

## 2018-07-28 VITALS — BP 114/65 | Wt 143.0 lb

## 2018-07-28 DIAGNOSIS — M545 Low back pain, unspecified: Secondary | ICD-10-CM

## 2018-07-28 DIAGNOSIS — L4050 Arthropathic psoriasis, unspecified: Secondary | ICD-10-CM

## 2018-07-28 MED ORDER — IBUPROFEN 800 MG PO TABS: 800.0000 mg | ORAL_TABLET | Freq: Three times a day (TID) | ORAL | 0 refills | Status: DC | PRN

## 2018-07-28 NOTE — Progress Notes (Signed)
   Subjective:    Patient ID: [name], male    DOB: 28-Jun-1945, 73 y.o.   MRN: [id number]  HPI Documentation for virtual telephone encounter.  Documentation for virtual audio and video telecommunications through [employer] encounter:  Interactive audio and video telecommunications were attempted between this provider and patient, however due to patient 5, they did not have access to video capability.  We continued and completed visit with audio only. The patient was located at home. The provider was located in the office. The patient did consent to this visit and is aware of possible charges through their insurance for this visit. The other persons participating in this telemedicine service were none. Time spent on call was 10 minutes This virtual service is not related to other E/M service within previous 7 days. He states that he injured his back in mid February while carrying a backpack through an airport.  He has been using ibuprofen twice per day with some relief of his symptoms but then when he goes out and does anything physical the pain reoccurs.  No numbness, tingling or weakness. He does have a history of psoriatic arthritis and uses the ibuprofen for that.   Review of Systems     Objective:   Physical Exam Alert and in no distress.        Assessment & Plan:  Low back pain without sciatica, unspecified back pain laterality, unspecified chronicity  Psoriatic arthritis ([place]) - Plan: ibuprofen (ADVIL) 800 MG tablet He is to use ibuprofen 800 mg 3 times daily, heat to the back for 20 minutes 3 times a day with gentle stretching after that.  Cautioned him to start back his physical activity level to roughly 50%.  He will call if further trouble.

## 2018-08-16 ENCOUNTER — Other Ambulatory Visit: Payer: Self-pay

## 2018-08-16 NOTE — Patient Outreach (Signed)
[facility]) Care Management  08/16/2018  Jasmeet Gehl 03-Sep-1945 [id number]   Medication Adherence call to Mr. Kai Calico spoke with patient he is due on Losartan /Hctz and lipitor 10 mg patient did not want to engage and hung up. Mr. Minturn is showing past due under [place].   [place] Management Direct Dial [phone]  Fax [phone] [email or web address]

## 2018-09-07 ENCOUNTER — Other Ambulatory Visit: Payer: Self-pay | Admitting: Family Medicine

## 2018-09-07 DIAGNOSIS — L4050 Arthropathic psoriasis, unspecified: Secondary | ICD-10-CM

## 2018-09-07 NOTE — Telephone Encounter (Signed)
[facility] is requesting to fill pt ibuprofen. Please advise [facility]

## 2018-09-16 ENCOUNTER — Ambulatory Visit: Payer: Medicare Other | Admitting: Family Medicine

## 2018-09-16 ENCOUNTER — Other Ambulatory Visit: Payer: Self-pay

## 2018-09-16 ENCOUNTER — Encounter: Payer: Self-pay | Admitting: Family Medicine

## 2018-09-16 VITALS — BP 112/72 | HR 81 | Temp 98.6°F | Ht 67.0 in | Wt 139.8 lb

## 2018-09-16 DIAGNOSIS — E785 Hyperlipidemia, unspecified: Secondary | ICD-10-CM | POA: Diagnosis not present

## 2018-09-16 DIAGNOSIS — K219 Gastro-esophageal reflux disease without esophagitis: Secondary | ICD-10-CM

## 2018-09-16 DIAGNOSIS — Z21 Asymptomatic human immunodeficiency virus [HIV] infection status: Secondary | ICD-10-CM | POA: Diagnosis not present

## 2018-09-16 DIAGNOSIS — L4050 Arthropathic psoriasis, unspecified: Secondary | ICD-10-CM

## 2018-09-16 DIAGNOSIS — Z1211 Encounter for screening for malignant neoplasm of colon: Secondary | ICD-10-CM | POA: Diagnosis not present

## 2018-09-16 DIAGNOSIS — I2510 Atherosclerotic heart disease of native coronary artery without angina pectoris: Secondary | ICD-10-CM

## 2018-09-16 DIAGNOSIS — E781 Pure hyperglyceridemia: Secondary | ICD-10-CM

## 2018-09-16 DIAGNOSIS — J301 Allergic rhinitis due to pollen: Secondary | ICD-10-CM

## 2018-09-16 DIAGNOSIS — Z79899 Other long term (current) drug therapy: Secondary | ICD-10-CM

## 2018-09-16 MED ORDER — ROSUVASTATIN CALCIUM 20 MG PO TABS: 20.0000 mg | ORAL_TABLET | Freq: Every day | ORAL | 3 refills | Status: DC

## 2018-09-16 NOTE — Progress Notes (Signed)
[name] is a 73 y.o. male who presents for annual wellness visit and follow-up on chronic medical conditions.  He has had difficulty recently with low back pain but researched this on the Internet and has been doing some exercises for the last 2 weeks for his low back and Allison them to be quite useful. He has not had any chest pain, shortness of breath, PND.  He periodically does see cardiology.  He does have underlying psoriatic arthritis but presently is not taking any medications for that.  Continues on Lipitor.  He is also taking losartan/HCTZ.  His allergies are causing very little difficulty.  Reflux is also under good control.  He has no other concerns or complaints.  Immunizations and Health Maintenance Immunization History  Administered Date(s) Administered  . Influenza Split 01/15/2011, 03/15/2012  . Influenza Whole 12/28/2006, 01/11/2009  . Influenza, High Dose Seasonal PF 02/10/2013, 03/29/2014, 03/21/2015, 04/03/2016, 03/24/2017  . Pneumococcal Conjugate-13 02/12/2005  . Pneumococcal Polysaccharide-23 03/24/2013  . Tdap 03/15/2012   There are no preventive care reminders to display for this patient.  Last colonoscopy: cologuard 2017 Last PSA: unkown Dentist: jan 2020 Ophtho: 2019 Exercise: aerobics    Other doctors caring for patient include: Dr. Gwenlyn Allison cardio.  Advanced Directives:yes ; copy requested Does Patient Have a Medical Advance Directive?: Yes Type of Advance Directive: Living will, Healthcare Power of Attorney Does patient want to make changes to medical advance directive?: No - Patient declined Copy of [place] in Chart?: No - copy requested  Depression screen:  See questionnaire below.     Depression screen [facility] 2/9 09/16/2018 09/29/2017 09/25/2016 09/25/2015 09/18/2014  Decreased Interest 0 0 0 0 0  Down, Depressed, Hopeless 0 0 0 0 0  PHQ - 2 Score 0 0 0 0 0    Fall Screen: See Questionaire below.   Fall Risk  09/16/2018  09/29/2017 09/25/2016 09/25/2015 09/18/2014  Falls in the past year? 0 No No No No    ADL screen:  See questionnaire below.  Functional Status Survey: Is the patient deaf or have difficulty hearing?: No Does the patient have difficulty seeing, even when wearing glasses/contacts?: No Does the patient have difficulty concentrating, remembering, or making decisions?: No Does the patient have difficulty walking or climbing stairs?: No Does the patient have difficulty dressing or bathing?: No Does the patient have difficulty doing errands alone such as visiting a doctor's office or shopping?: No   Review of Systems  Constitutional: -, -unexpected weight change, -anorexia, -fatigue Allergy: -sneezing, -itching, -congestion Dermatology: denies changing moles, rash, lumps ENT: -runny nose, -ear pain, -sore throat,  Cardiology:  -chest pain, -palpitations, -orthopnea, Respiratory: -cough, -shortness of breath, -dyspnea on exertion, -wheezing,  Gastroenterology: -abdominal pain, -nausea, -vomiting, -diarrhea, -constipation, -dysphagia Hematology: -bleeding or bruising problems Musculoskeletal: -arthralgias, -myalgias, -joint swelling, -back pain, - Ophthalmology: -vision changes,  Urology: -dysuria, -difficulty urinating,  -urinary frequency, -urgency, incontinence Neurology: -, -numbness, , -memory loss, -falls, -dizziness    PHYSICAL EXAM:  BP 112/72 (BP Location: Left Arm, Patient Position: Sitting)   Pulse 81   Temp 98.6 F (37 C)   Ht 5\' 7"  (1.702 m)   Wt 139 lb 12.8 oz (63.4 kg)   SpO2 95%   BMI 21.90 kg/m   General Appearance: Alert, cooperative, no distress, appears stated age Head: Normocephalic, without obvious abnormality, atraumatic Eyes: PERRL, conjunctiva/corneas clear, EOM's intact, fundi benign Ears: Normal TM's and external ear canals Nose: Nares normal, mucosa normal, no drainage  or sinus   tenderness Throat: Lips, mucosa, and tongue normal; teeth and gums  normal Neck: Supple, no lymphadenopathy, thyroid:no enlargement/tenderness/nodules; no carotid bruit or JVD Lungs: Clear to auscultation bilaterally without wheezes, rales or ronchi; respirations unlabored Heart: Regular rate and rhythm, S1 and S2 normal, no murmur, rub or gallop Abdomen: Soft, non-tender, nondistended, normoactive bowel sounds, no masses, no hepatosplenomegaly Extremities: No clubbing, cyanosis or edema Pulses: 2+ and symmetric all extremities Skin: Skin color, texture, turgor normal, no rashes or lesions Lymph nodes: Cervical, supraclavicular, and axillary nodes normal Neurologic: CNII-XII intact, normal strength, sensation and gait; reflexes 2+ and symmetric throughout   Psych: Normal mood, affect, hygiene and grooming  ASSESSMENT/PLAN: HIV positive ([place]) - Plan: Comprehensive metabolic panel, Lipid panel, HIV-1 RNA quant-no reflex-bld, T-helper cells (CD4) count (not at [facility]), CBC with Differential/Platelet,   Screening for colon cancer - Plan: Cologuard,   Psoriatic arthritis ([place]) - Plan: Continue with conservative care.  He is comfortable not being on a DMARD.  Hyperlipidemia, unspecified hyperlipidemia type - Plan: Lipid panel, rosuvastatin (CRESTOR) 20 MG tablet, I will switch him to Crestor instead of Lipitor for better control.  Gastroesophageal reflux disease without esophagitis - Plan: Treat as needed  Encounter for long-term (current) use of high-risk medication - Plan: Comprehensive metabolic panel, Lipid panel, HIV-1 RNA quant-no reflex-bld, T-helper cells (CD4) count (not at [facility]), CBC with Differential/Platelet,   ASHD (arteriosclerotic heart disease) - Plan: Comprehensive metabolic panel, Lipid panel, CBC with Differential/Platelet, follow-up with cardiology  Seasonal allergic rhinitis due to pollen - Plan: Continue to treat as needed.  recommended at least 30 minutes of aerobic activity at least 5 days/week; proper sunscreen use reviewed; healthy diet  and alcohol recommendations (less than or equal to 2 drinks/day) reviewed; regular seatbelt use; changing batteries in smoke detectors. Immunization recommendations discussed.  Colonoscopy recommendations reviewed.   Medicare Attestation I have personally reviewed: The patient's medical and social history Their use of alcohol, tobacco or illicit drugs Their current medications and supplements The patient's functional ability including ADLs,fall risks, home safety risks, cognitive, and hearing and visual impairment Diet and physical activities Evidence for depression or mood disorders  The patient's weight, height, and BMI have been recorded in the chart.  I have made referrals, counseling, and provided education to the patient based on review of the above and I have provided the patient with a written personalized care plan for preventive services.     Jill Alexanders, MD   09/16/2018

## 2018-09-16 NOTE — Patient Instructions (Signed)
  [name] , Thank you for taking time to come for your Medicare Wellness Visit. I appreciate your ongoing commitment to your health goals. Please review the following plan we discussed and let me know if I can assist you in the future.   These are the goals we discussed: Done physical therapy This is a list of the screening recommended for you and due dates:  Health Maintenance  Topic Date Due  . Flu Shot  10/09/2018  . Cologuard (Stool DNA test)  10/23/2018  . Tetanus Vaccine  03/15/2022  .  Hepatitis C: One time screening is recommended by Center for Disease Control  (CDC) for  adults born from [birth year] through 1965.   Completed  . Pneumonia vaccines  Completed

## 2018-09-19 LAB — COMPREHENSIVE METABOLIC PANEL
ALT: 17 IU/L (ref 0–44)
AST: 17 IU/L (ref 0–40)
Albumin/Globulin Ratio: 2 (ref 1.2–2.2)
Albumin: 4.8 g/dL — ABNORMAL HIGH (ref 3.7–4.7)
Alkaline Phosphatase: 60 IU/L (ref 39–117)
BUN/Creatinine Ratio: 16 (ref 10–24)
BUN: 25 mg/dL (ref 8–27)
Bilirubin Total: 0.5 mg/dL (ref 0.0–1.2)
CO2: 21 mmol/L (ref 20–29)
Calcium: 9.6 mg/dL (ref 8.6–10.2)
Chloride: 102 mmol/L (ref 96–106)
Creatinine, Ser: 1.61 mg/dL — ABNORMAL HIGH (ref 0.76–1.27)
GFR calc Af Amer: 49 mL/min/{1.73_m2} — ABNORMAL LOW (ref >59)
GFR calc non Af Amer: 42 mL/min/{1.73_m2} — ABNORMAL LOW (ref >59)
Globulin, Total: 2.4 g/dL (ref 1.5–4.5)
Glucose: 100 mg/dL — ABNORMAL HIGH (ref 65–99)
Potassium: 3.6 mmol/L (ref 3.5–5.2)
Sodium: 141 mmol/L (ref 134–144)
Total Protein: 7.2 g/dL (ref 6.0–8.5)

## 2018-09-19 LAB — T-HELPER CELLS (CD4) COUNT (NOT AT ARMC)
% CD 4 Pos. Lymph.: 38.4 % (ref 30.8–58.5)
Absolute CD 4 Helper: 461 /uL (ref 359–1519)
Basophils Absolute: 0 10*3/uL (ref 0.0–0.2)
Basos: 1 %
EOS (ABSOLUTE): 0.1 10*3/uL (ref 0.0–0.4)
Eos: 1 %
Hematocrit: 43.2 % (ref 37.5–51.0)
Hemoglobin: 15.5 g/dL (ref 13.0–17.7)
Immature Grans (Abs): 0 10*3/uL (ref 0.0–0.1)
Immature Granulocytes: 0 %
Lymphocytes Absolute: 1.2 10*3/uL (ref 0.7–3.1)
Lymphs: 20 %
MCH: 33.5 pg — ABNORMAL HIGH (ref 26.6–33.0)
MCHC: 35.9 g/dL — ABNORMAL HIGH (ref 31.5–35.7)
MCV: 93 fL (ref 79–97)
Monocytes Absolute: 0.4 10*3/uL (ref 0.1–0.9)
Monocytes: 6 %
Neutrophils Absolute: 4.5 10*3/uL (ref 1.4–7.0)
Neutrophils: 72 %
Platelets: 170 10*3/uL (ref 150–450)
RBC: 4.63 x10E6/uL (ref 4.14–5.80)
RDW: 11.8 % (ref 11.6–15.4)
WBC: 6.2 10*3/uL (ref 3.4–10.8)

## 2018-09-19 LAB — LIPID PANEL
Chol/HDL Ratio: 5.4 ratio — ABNORMAL HIGH (ref 0.0–5.0)
Cholesterol, Total: 146 mg/dL (ref 100–199)
HDL: 27 mg/dL — ABNORMAL LOW (ref >39)
LDL Calculated: 69 mg/dL (ref 0–99)
Triglycerides: 251 mg/dL — ABNORMAL HIGH (ref 0–149)
VLDL Cholesterol Cal: 50 mg/dL — ABNORMAL HIGH (ref 5–40)

## 2018-09-19 LAB — HIV-1 RNA QUANT-NO REFLEX-BLD: HIV-1 RNA Viral Load: <20 copies/mL

## 2018-09-20 MED ORDER — VASCEPA 1 G PO CAPS: 2.0000 | ORAL_CAPSULE | Freq: Two times a day (BID) | ORAL | 3 refills | Status: DC

## 2018-09-20 NOTE — Addendum Note (Signed)
Addended by: Denita Lung on: 09/20/2018 08:10 AM   Modules accepted: Orders

## 2018-10-05 ENCOUNTER — Telehealth: Payer: Self-pay

## 2018-10-05 NOTE — Telephone Encounter (Signed)
Pt called to advise office that he is not due for cologuard . [place]

## 2018-11-08 LAB — COLOGUARD: Cologuard: NEGATIVE

## 2018-11-19 ENCOUNTER — Other Ambulatory Visit: Payer: Self-pay | Admitting: Family Medicine

## 2018-11-19 DIAGNOSIS — L4050 Arthropathic psoriasis, unspecified: Secondary | ICD-10-CM

## 2018-11-29 ENCOUNTER — Ambulatory Visit: Payer: Medicare Other | Admitting: Family Medicine

## 2018-11-29 ENCOUNTER — Ambulatory Visit
Admission: RE | Admit: 2018-11-29 | Discharge: 2018-11-29 | Disposition: A | Discharge status: Home / Self Care | Payer: Medicare Other | Source: Ambulatory Visit | Attending: Family Medicine | Admitting: Family Medicine

## 2018-11-29 ENCOUNTER — Other Ambulatory Visit: Payer: Self-pay

## 2018-11-29 VITALS — BP 112/70 | HR 72 | Temp 98.4°F | Wt 139.8 lb

## 2018-11-29 DIAGNOSIS — Z23 Encounter for immunization: Secondary | ICD-10-CM | POA: Diagnosis not present

## 2018-11-29 DIAGNOSIS — M545 Low back pain, unspecified: Secondary | ICD-10-CM

## 2018-11-29 DIAGNOSIS — I700 Atherosclerosis of aorta: Secondary | ICD-10-CM | POA: Diagnosis not present

## 2018-11-29 NOTE — Progress Notes (Addendum)
   Subjective:    Patient ID: Rustin Mcgonigle, male    DOB: 02-22-1946, 73 y.o.   MRN: [id number]  HPI He complains of difficulty with back pain that started after a trip by plane that required him to carry heavy luggage.  Since then he is continued to complain of mid low back pain.  He continues on an NSAID he uses for his psoriasis.  No numbness, tingling or weakness.  No bowel or bladder trouble.   Review of Systems     Objective:   Physical Exam Normal lumbar curve and motion.  Questionable tenderness to palpation over the mid posterior spine area.  Negative straight leg raising.  DTRs are diminished at 1+.  Normal hip motion.      Assessment & Plan:  Acute midline low back pain without sciatica - Plan: DG Lumbar Spine Complete  Need for influenza vaccination - Plan: Flu Vaccine QUAD High Dose(Fluad) Follow-up pending results of x-ray.  9/22 History does show DDD in multiple areas.  I will refer to Dr. Ernestina Patches to get his input into this.  We will also set up for repeat ultrasound to look at his aorta.

## 2018-11-30 DIAGNOSIS — I700 Atherosclerosis of aorta: Secondary | ICD-10-CM | POA: Insufficient documentation

## 2018-11-30 NOTE — Addendum Note (Signed)
Addended by: Denita Lung on: 11/30/2018 01:38 PM   Modules accepted: Orders

## 2018-12-10 ENCOUNTER — Ambulatory Visit
Admission: RE | Admit: 2018-12-10 | Discharge: 2018-12-10 | Disposition: A | Discharge status: Home / Self Care | Payer: Medicare Other | Source: Ambulatory Visit | Attending: Family Medicine | Admitting: Family Medicine

## 2018-12-15 ENCOUNTER — Ambulatory Visit (INDEPENDENT_AMBULATORY_CARE_PROVIDER_SITE_OTHER): Payer: Medicare Other | Admitting: Physical Medicine and Rehabilitation

## 2018-12-15 ENCOUNTER — Encounter: Payer: Self-pay | Admitting: Physical Medicine and Rehabilitation

## 2018-12-15 VITALS — BP 118/77 | HR 85 | Ht 68.0 in | Wt 140.0 lb

## 2018-12-15 DIAGNOSIS — M5441 Lumbago with sciatica, right side: Secondary | ICD-10-CM

## 2018-12-15 DIAGNOSIS — M47816 Spondylosis without myelopathy or radiculopathy, lumbar region: Secondary | ICD-10-CM | POA: Diagnosis not present

## 2018-12-15 DIAGNOSIS — S39012S Strain of muscle, fascia and tendon of lower back, sequela: Secondary | ICD-10-CM | POA: Diagnosis not present

## 2018-12-15 DIAGNOSIS — G8929 Other chronic pain: Secondary | ICD-10-CM

## 2018-12-15 DIAGNOSIS — M5442 Lumbago with sciatica, left side: Secondary | ICD-10-CM

## 2018-12-15 MED ORDER — MELOXICAM 15 MG PO TABS: 15.0000 mg | ORAL_TABLET | Freq: Every day | ORAL | 0 refills | Status: DC

## 2018-12-15 NOTE — Progress Notes (Signed)
 .  Numeric Pain Rating Scale and Functional Assessment Average Pain 7 Pain Right Now 3 My pain is intermittent, dull and aching Pain is worse with: walking, bending and some activites Pain improves with: rest and medication   In the last MONTH (on 0-10 scale) has pain interfered with the following?  1. General activity like being  able to carry out your everyday physical activities such as walking, climbing stairs, carrying groceries, or moving a chair?  Rating(7)  2. Relation with others like being able to carry out your usual social activities and roles such as  activities at home, at work and in your community. Rating(7)  3. Enjoyment of life such that you have  been bothered by emotional problems such as feeling anxious, depressed or irritable?  Rating(4)

## 2018-12-27 ENCOUNTER — Ambulatory Visit: Payer: Self-pay

## 2018-12-27 ENCOUNTER — Ambulatory Visit (INDEPENDENT_AMBULATORY_CARE_PROVIDER_SITE_OTHER): Payer: Medicare Other | Admitting: Physical Medicine and Rehabilitation

## 2018-12-27 ENCOUNTER — Other Ambulatory Visit: Payer: Self-pay

## 2018-12-27 ENCOUNTER — Encounter: Payer: Self-pay | Admitting: Physical Medicine and Rehabilitation

## 2018-12-27 VITALS — BP 130/76 | HR 72

## 2018-12-27 DIAGNOSIS — M47816 Spondylosis without myelopathy or radiculopathy, lumbar region: Secondary | ICD-10-CM

## 2018-12-27 MED ORDER — METHYLPREDNISOLONE ACETATE 80 MG/ML IJ SUSP
80.0000 mg | Freq: Once | INTRAMUSCULAR | Status: AC
  Administered 2018-12-27: 80 mg

## 2018-12-27 NOTE — Progress Notes (Signed)
  Numeric Pain Rating Scale and Functional Assessment Average Pain (5)   In the last MONTH (on 0-10 scale) has pain interfered with the following?  1. General activity like being  able to carry out your everyday physical activities such as walking, climbing stairs, carrying groceries, or moving a chair?  Rating(5)   +Driver, -BT, -Dye Allergies.  

## 2019-01-06 ENCOUNTER — Other Ambulatory Visit: Payer: Self-pay | Admitting: Physical Medicine and Rehabilitation

## 2019-01-06 NOTE — Telephone Encounter (Signed)
Please advise 

## 2019-01-19 ENCOUNTER — Encounter: Payer: Self-pay | Admitting: Physical Medicine and Rehabilitation

## 2019-01-19 NOTE — Progress Notes (Signed)
[name] - 73 y.o. male MRN [id number]  Date of birth: [birth year]  Office Visit Note: Visit Date: 12/15/2018 PCP: Denita Lung, MD Referred by: Denita Lung, MD  Subjective: Chief Complaint  Patient presents with   Lower Back - Pain   Right Thigh - Pain   Left Thigh - Pain   HPI: [name] is a 73 y.o. male who comes in today At the request of Dr. Jill Alexanders for new patient evaluation and management of chronic worsening low back pain and bilateral thigh pain.  Patient reports that in March of this year he was making a trip to some property that they have and what sounded like the [place] and was running through the airport to make a flight and was carrying a backpack and began having considerable pain in his back and his lower buttock area and posterior thighs.  He has no prior history of stenosis or neurogenic claudication.  He does have known atherosclerosis of the aorta and some heart disease in that regard.  He has been treated for hypertension but has not really had great deal of work-up for peripheral vascular disease.  He does have routine evaluation of the atherosclerosis particular in the aorta.  He saw Dr. Redmond School after this increasing pain and was prescribed ibuprofen.  At various times he has had Celebrex and other anti-inflammatory.  He reports that he is doing quite well at times.  He also reports he really had self researched back pain and he has been doing exercises pretty religiously which seems like it has been helping.  He continues to use the Celebrex and some of the ibuprofen.  He reports no specific injury during the episode when it started just increasing activity.  He gets worse with walking and bending.  He is better with rest and medication.  Symptoms are intermittent dull and aching.  His average pain can be as high as a 7 but right now is actually doing fairly well with 3 out of 10.  He denies any focal weakness or numbness  tingling paresthesias.  He has had no fevers chills or night sweats or any other concerning symptoms.  X-rays were obtained and those are reviewed below and reviewed with the patient.  He has not had MRI or CT scan of the lumbar spine.  He said no prior back surgeries.  His situation is more complex with history of psoriatic arthritis and HIV positive.  He does not take any statin medications.  Review of Systems  Constitutional: Negative for chills, fever, malaise/fatigue and weight loss.  HENT: Negative for hearing loss and sinus pain.   Eyes: Negative for blurred vision, double vision and photophobia.  Respiratory: Negative for cough and shortness of breath.   Cardiovascular: Negative for chest pain, palpitations and leg swelling.  Gastrointestinal: Negative for abdominal pain, nausea and vomiting.  Genitourinary: Negative for flank pain.  Musculoskeletal: Positive for back pain. Negative for myalgias.  Skin: Negative for itching and rash.  Neurological: Negative for tingling, tremors, focal weakness and weakness.  Endo/Heme/Allergies: Negative.   Psychiatric/Behavioral: Negative for depression.  All other systems reviewed and are negative.  Otherwise per HPI.  Assessment & Plan: Visit Diagnoses:  1. Spondylosis without myelopathy or radiculopathy, lumbar region   2. Strain of lumbar region, sequela   3. Chronic bilateral low back pain with bilateral sciatica     Plan: Findings:  Chronic history of intermittent back pain off and on  but now with pretty significant increase in pain since March of this year with some relief with exercises and medication management but persistent pain.  He actually feels like he is doing fairly well at this point.  I think the next step is to continues with his home exercises and we did discuss exercises and activity modification at this point.  We also decided to prescribe meloxicam to take instead of the Celebrex at least for a week or 2 to take that  consistently without the ibuprofen.  I would like to see if that helps any.  Depending on where this heads I would look at potential for one-time epidural injection versus MRI of the lumbar spine looking for stenosis.  He seems to have some claudication type symptoms.  He does have multilevel spondylitic change but particular at L5-S1.  Most of his back pain is likely facet arthritis but at this point he is doing fairly well and it seems like it has gotten better but it is just taken a long time and is still not where he would like to be and he intermittently gets worsening.    Meds & Orders:   No orders of the defined types were placed in this encounter.   Follow-up: Return if symptoms worsen or fail to improve.   Procedures: No procedures performed  No notes on file   Clinical History: EXAM: LUMBAR SPINE - COMPLETE 4+ VIEW  COMPARISON:  None.  FINDINGS: There are 5 non rib-bearing lumbar type vertebral bodies.  Normal alignment of the lumbar spine. No anterolisthesis or retrolisthesis  Lumbar vertebral body heights are preserved.  Mild to moderate multilevel lumbar spine DDD, worse at L1-L2 and L5-S1 with disc space height loss, endplate irregularity and sclerosis. There is partial ossification of the T12-L1 intervertebral disc.  Limited visualization of the bilateral SI joints and hips is normal.  Prominent phleboliths overlies the right hemipelvis. Atherosclerotic plaque within a potentially mildly ectatic abdominal aorta measuring 2.7 cm in diameter. Vascular calcifications overlies expected location of the splenic artery. Approximately 0.5 cm opacity overlying the left renal fossa may represent a renal stone.  IMPRESSION: 1. Mild-to-moderate multilevel lumbar spine DDD, worse at L1-L2 and L5-S1. 2. Atherosclerotic plaque within a potentially ectatic abdominal aorta measuring approximately 2.7 cm in diameter. Further evaluation with abdominal aortic ultrasound  could be performed as clinically indicated. 3. Potential left-sided nephrolithiasis. Clinical correlation is advised. Further evaluation renal ultrasound could be performed as indicated.   Electronically Signed   By: Sandi Mariscal M.D.   On: 11/30/2018 07:24   He reports that he quit smoking about 8 years ago. His smoking use included cigarettes. He has never used smokeless tobacco. No results for input(s): HGBA1C, LABURIC in the last 8760 hours.  Objective:  VS:  HT:5\' 8"  (172.7 cm)    WT:140 lb (63.5 kg)   BMI:21.29     BP:118/77   HR:85bpm   TEMP: ( )   RESP:  Physical Exam Vitals signs and nursing note reviewed.  Constitutional:      General: He is not in acute distress.    Appearance: He is well-developed.  HENT:     Head: Normocephalic and atraumatic.     Nose: Nose normal.     Mouth/Throat:     Mouth: Mucous membranes are moist.     Pharynx: Oropharynx is clear.  Eyes:     Conjunctiva/sclera: Conjunctivae normal.     Pupils: Pupils are equal, round, and reactive to light.  Neck:     Musculoskeletal: Normal range of motion and neck supple.     Trachea: No tracheal deviation.  Cardiovascular:     Rate and Rhythm: Normal rate and regular rhythm.     Pulses: Normal pulses.  Pulmonary:     Effort: Pulmonary effort is normal.     Breath sounds: Normal breath sounds.  Abdominal:     General: There is no distension.     Palpations: Abdomen is soft.     Tenderness: There is no guarding or rebound.  Musculoskeletal:        General: No deformity.     Right lower leg: No edema.     Left lower leg: No edema.     Comments: Patient somewhat slow to rise from a seated position to full standing does have pain with facet loading and extension of the lumbar spine.  No focal trigger points although he does have some pain with deep palpation into the quadratus lumborum.  No pain with hip rotation no pain over the PSIS or greater trochanters.  He has good distal strength.  Skin:     General: Skin is warm and dry.     Findings: No erythema or rash.  Neurological:     General: No focal deficit present.     Mental Status: He is alert and oriented to person, place, and time.     Motor: No abnormal muscle tone.     Coordination: Coordination normal.     Gait: Gait normal.  Psychiatric:        Mood and Affect: Mood normal.        Behavior: Behavior normal.        Thought Content: Thought content normal.     Ortho Exam Imaging: No results found.  Past Medical/Family/Surgical/Social History: Medications & Allergies reviewed per EMR, new medications updated. Patient Active Problem List   Diagnosis Date Noted   Atherosclerosis of aorta ([place]) 11/30/2018   Encounter for long-term (current) use of high-risk medication 03/18/2018   Drug-induced erectile dysfunction 09/25/2015   HIV positive ([place]) 08/21/2010   Hyperlipidemia 08/21/2010   Psoriatic arthritis ([place]) 08/21/2010   ASHD (arteriosclerotic heart disease) 08/21/2010   Herpes labialis 08/21/2010   Allergic rhinitis due to pollen 08/21/2010   GERD (gastroesophageal reflux disease) 08/21/2010   Kidney stones 08/21/2010   Past Medical History:  Diagnosis Date   Allergy    RHINITIS   ASHD (arteriosclerotic heart disease)    Former smoker QUIT 08/09/2010   Herpes labialis    HH (hiatus hernia)    HIV infection ([place])    POSITIVE   Hypertriglyceridemia    Psoriasis    ARTHRITIS   Renal stones    Tinnitus    CHRONIC   History reviewed. No pertinent family history. Past Surgical History:  Procedure Laterality Date   CORONARY ARTERY BYPASS GRAFT  2001   Social History   Occupational History   Not on file  Tobacco Use   Smoking status: Former Smoker    Types: Cigarettes    Quit date: 08/09/2010    Years since quitting: 8.4   Smokeless tobacco: Never Used  Substance and Sexual Activity   Alcohol use: Yes    Alcohol/week: 4.0 standard drinks    Types: 4 Glasses of wine per  week   Drug use: No   Sexual activity: Yes

## 2019-01-26 NOTE — Progress Notes (Signed)
[name] - 73 y.o. male MRN [id number]  Date of birth: 1945/04/09  Office Visit Note: Visit Date: 12/27/2018 PCP: Denita Lung, MD Referred by: Denita Lung, MD  Subjective: Chief Complaint  Patient presents with  . Lower Back - Pain  . Right Thigh - Pain  . Left Thigh - Pain   HPI:  [name] is a 73 y.o. male who comes in today For planned bilateral L4-5 facet joint blocks for axial low back pain.  Please see our prior notes for further details justification for the injection.  He is tried and failed conservative care.  He has been using meloxicam now and has found some relief with that and he will continue to take that at least for right now.  It is something we will have to watch with his primary care physician Dr. Redmond School.  ROS Otherwise per HPI.  Assessment & Plan: Visit Diagnoses:  1. Spondylosis without myelopathy or radiculopathy, lumbar region     Plan: No additional findings.   Meds & Orders:  Meds ordered this encounter  Medications  . methylPREDNISolone acetate (DEPO-MEDROL) injection 80 mg    Orders Placed This Encounter  Procedures  . Facet Injection  . XR C-ARM NO REPORT    Follow-up: No follow-ups on file.   Procedures: No procedures performed  Lumbar Facet Joint Intra-Articular Injection(s) with Fluoroscopic Guidance  Patient: [name]      Date of Birth: Feb 06, 1946 MRN: [id number] PCP: Denita Lung, MD      Visit Date: 12/27/2018   Universal Protocol:    Date/Time: 12/27/2018  Consent Given By: the patient  Position: PRONE   Additional Comments: Vital signs were monitored before and after the procedure. Patient was prepped and draped in the usual sterile fashion. The correct patient, procedure, and site was verified.   Injection Procedure Details:  Procedure Site One Meds Administered:  Meds ordered this encounter  Medications  . methylPREDNISolone acetate (DEPO-MEDROL) injection 80 mg      Laterality: Bilateral  Location/Site:  L4-L5  Needle size: 22 guage  Needle type: Spinal  Needle Placement: Articular  Findings:  -Comments: Excellent flow of contrast producing a partial arthrogram.  Procedure Details: The fluoroscope beam is vertically oriented in AP, and the inferior recess is visualized beneath the lower pole of the inferior apophyseal process, which represents the target point for needle insertion. When direct visualization is difficult the target point is located at the medial projection of the vertebral pedicle. The region overlying each aforementioned target is locally anesthetized with a 1 to 2 ml. volume of 1% Lidocaine without Epinephrine.   The spinal needle was inserted into each of the above mentioned facet joints using biplanar fluoroscopic guidance. A 0.25 to 0.5 ml. volume of Isovue-250 was injected and a partial facet joint arthrogram was obtained. A single spot film was obtained of the resulting arthrogram.    One to 1.25 ml of the steroid/anesthetic solution was then injected into each of the facet joints noted above.   Additional Comments:  The patient tolerated the procedure well Dressing: 2 x 2 sterile gauze and Band-Aid    Post-procedure details: Patient was observed during the procedure. Post-procedure instructions were reviewed.  Patient left the clinic in stable condition.    Clinical History: EXAM: LUMBAR SPINE - COMPLETE 4+ VIEW  COMPARISON:  None.  FINDINGS: There are 5 non rib-bearing lumbar type vertebral bodies.  Normal alignment of the lumbar  spine. No anterolisthesis or retrolisthesis  Lumbar vertebral body heights are preserved.  Mild to moderate multilevel lumbar spine DDD, worse at L1-L2 and L5-S1 with disc space height loss, endplate irregularity and sclerosis. There is partial ossification of the T12-L1 intervertebral disc.  Limited visualization of the bilateral SI joints and hips is normal.   Prominent phleboliths overlies the right hemipelvis. Atherosclerotic plaque within a potentially mildly ectatic abdominal aorta measuring 2.7 cm in diameter. Vascular calcifications overlies expected location of the splenic artery. Approximately 0.5 cm opacity overlying the left renal fossa may represent a renal stone.  IMPRESSION: 1. Mild-to-moderate multilevel lumbar spine DDD, worse at L1-L2 and L5-S1. 2. Atherosclerotic plaque within a potentially ectatic abdominal aorta measuring approximately 2.7 cm in diameter. Further evaluation with abdominal aortic ultrasound could be performed as clinically indicated. 3. Potential left-sided nephrolithiasis. Clinical correlation is advised. Further evaluation renal ultrasound could be performed as indicated.   Electronically Signed   By: Sandi Mariscal M.D.   On: 11/30/2018 07:24     Objective:  VS:  HT:    WT:   BMI:     BP:130/76  HR:72bpm  TEMP: ( )  RESP:  Physical Exam  Ortho Exam Imaging: No results found.

## 2019-01-26 NOTE — Procedures (Signed)
Lumbar Facet Joint Intra-Articular Injection(s) with Fluoroscopic Guidance  Patient: [name]      Date of Birth: [birth year] MRN: [id number] PCP: Denita Lung, MD      Visit Date: 12/27/2018   Universal Protocol:    Date/Time: 12/27/2018  Consent Given By: the patient  Position: PRONE   Additional Comments: Vital signs were monitored before and after the procedure. Patient was prepped and draped in the usual sterile fashion. The correct patient, procedure, and site was verified.   Injection Procedure Details:  Procedure Site One Meds Administered:  Meds ordered this encounter  Medications  . methylPREDNISolone acetate (DEPO-MEDROL) injection 80 mg     Laterality: Bilateral  Location/Site:  L4-L5  Needle size: 22 guage  Needle type: Spinal  Needle Placement: Articular  Findings:  -Comments: Excellent flow of contrast producing a partial arthrogram.  Procedure Details: The fluoroscope beam is vertically oriented in AP, and the inferior recess is visualized beneath the lower pole of the inferior apophyseal process, which represents the target point for needle insertion. When direct visualization is difficult the target point is located at the medial projection of the vertebral pedicle. The region overlying each aforementioned target is locally anesthetized with a 1 to 2 ml. volume of 1% Lidocaine without Epinephrine.   The spinal needle was inserted into each of the above mentioned facet joints using biplanar fluoroscopic guidance. A 0.25 to 0.5 ml. volume of Isovue-250 was injected and a partial facet joint arthrogram was obtained. A single spot film was obtained of the resulting arthrogram.    One to 1.25 ml of the steroid/anesthetic solution was then injected into each of the facet joints noted above.   Additional Comments:  The patient tolerated the procedure well Dressing: 2 x 2 sterile gauze and Band-Aid    Post-procedure details: Patient was  observed during the procedure. Post-procedure instructions were reviewed.  Patient left the clinic in stable condition.

## 2019-02-04 ENCOUNTER — Other Ambulatory Visit: Payer: Self-pay | Admitting: Family Medicine

## 2019-02-04 ENCOUNTER — Other Ambulatory Visit: Payer: Self-pay | Admitting: Physical Medicine and Rehabilitation

## 2019-02-04 DIAGNOSIS — Z21 Asymptomatic human immunodeficiency virus [HIV] infection status: Secondary | ICD-10-CM

## 2019-02-07 NOTE — Telephone Encounter (Signed)
[facility] is requesting to fill pt [place]. Please advise [facility]

## 2019-02-07 NOTE — Telephone Encounter (Signed)
Will refill but will need to stop after this and or review with PCP

## 2019-02-07 NOTE — Telephone Encounter (Signed)
Please advise 

## 2019-02-08 NOTE — Telephone Encounter (Signed)
Called and left message to advise.

## 2019-02-09 ENCOUNTER — Telehealth: Payer: Self-pay | Admitting: Physical Medicine and Rehabilitation

## 2019-02-09 DIAGNOSIS — M47816 Spondylosis without myelopathy or radiculopathy, lumbar region: Secondary | ICD-10-CM

## 2019-02-09 NOTE — Telephone Encounter (Signed)
Can you put in referral for PT here, dx lumbar spndylosis, put not at bottom that says "spine stabilization, manual tx and dry needling if appropriate"

## 2019-02-09 NOTE — Telephone Encounter (Signed)
PT referral placed.

## 2019-02-10 NOTE — Telephone Encounter (Signed)
Patient notified

## 2019-02-16 ENCOUNTER — Ambulatory Visit: Payer: Medicare Other | Attending: Physical Medicine and Rehabilitation | Admitting: Physical Therapy

## 2019-02-16 ENCOUNTER — Encounter: Payer: Self-pay | Admitting: Physical Therapy

## 2019-02-16 ENCOUNTER — Other Ambulatory Visit: Payer: Self-pay

## 2019-02-16 DIAGNOSIS — M545 Low back pain, unspecified: Secondary | ICD-10-CM

## 2019-02-16 DIAGNOSIS — R293 Abnormal posture: Secondary | ICD-10-CM | POA: Insufficient documentation

## 2019-02-16 DIAGNOSIS — M6281 Muscle weakness (generalized): Secondary | ICD-10-CM | POA: Insufficient documentation

## 2019-02-16 NOTE — Therapy (Signed)
[facility] [address] [place], [place], [address] Phone: [phone]   Fax:  [phone]  Physical Therapy Evaluation  Patient Details  Name: [name] MRN: [id number] Date of Birth: 12/09/1945 Referring Provider (PT): Magnus Sinning, MD   Encounter Date: 02/16/2019  PT End of Session - 02/16/19 0928    Visit Number  1    Date for PT Re-Evaluation  04/01/19    Authorization Type  [employer] Medicare    Authorization Time Period  02/16/19 to 04/01/19    PT Start Time  0958    PT Stop Time  1045    PT Time Calculation (min)  47 min    Activity Tolerance  Patient tolerated treatment well;No increased pain    Behavior During Therapy  WFL for tasks assessed/performed       Past Medical History:  Diagnosis Date  . Allergy    RHINITIS  . ASHD (arteriosclerotic heart disease)   . Former smoker QUIT 08/09/2010  . Herpes labialis   . HH (hiatus hernia)   . HIV infection ([place])    POSITIVE  . Hypertriglyceridemia   . Psoriasis    ARTHRITIS  . Renal stones   . Tinnitus    CHRONIC    Past Surgical History:  Procedure Laterality Date  . CORONARY ARTERY BYPASS GRAFT  2001    There were no vitals filed for this visit.   Subjective Assessment - 02/16/19 0852    Subjective  [place] started bothering him back in February. He was taking ibuprofen to manage the pain and tried completing some execises on his own. He saw his PCP who referred to Dr. Ernestina Patches who gave him 4 injections. He had some relief with medication and the injections but continues to have pain.    Pertinent History  psoriatic arthritis    Limitations  House hold activities    How long can you walk comfortably?  30 minutes (back will stiffen up)    Diagnostic tests  xray    Patient Stated Goals  loosen up his back    Currently in Pain?  No/denies    Pain Score  --   2 or 3 if  standing   Pain Location  Back    Pain Orientation  Right;Left;Lower    Pain Descriptors / Indicators  Aching    Pain Type  Chronic pain    Pain Radiating Towards  posterior thighs    Pain Onset  More than a month ago    Pain Frequency  Intermittent    Aggravating Factors   standing too long, alot of house activity    Pain Relieving Factors  sitting, leaning forward         [facility] PT Assessment - 02/16/19 0001      Assessment   Medical Diagnosis  lumbar spondylosis     Referring Provider (PT)  Magnus Sinning, MD    Onset Date/Surgical Date  --   February 2020   Next MD Visit  after PT    Prior Therapy  none       Precautions   Precautions  None      Balance Screen   Has the patient fallen in the past 6 months  No    Has the patient had a decrease in activity level because of a fear of falling?   No    Is the patient  reluctant to leave their home because of a fear of falling?   No      Prior Function   Leisure  typically walking 20 minutes a day       Cognition   Overall Cognitive Status  Within Functional Limits for tasks assessed      Observation/Other Assessments   Observations  pt standing flexed forward    Focus on Therapeutic Outcomes (FOTO)   need next visit      Sensation   Additional Comments  denies numbness tingling at this time       ROM / Strength   AROM / PROM / Strength  AROM;Strength      AROM   AROM Assessment Site  Lumbar    Lumbar Flexion  WNL    Lumbar Extension  limited to neutral (+) pain     Lumbar - Right Rotation  limited 50% pain free    Lumbar - Left Rotation  limited 50% pain free      Strength   Overall Strength Comments  Rt and Lt hip abduction and extension 4/5 MMT, pain in low back with Rt hip extension      Flexibility   Soft Tissue Assessment /Muscle Length  yes    Hamstrings  lacking 40 deg at popliteal angle BLE    Quadriceps  Lt 95 deg in prone       Palpation   Spinal mobility  hypomobile lumbar and thoracic spine         Ambulation/Gait   Gait Comments  decreased hip extension noted bilaterally                 Objective measurements completed on examination: See above findings.      [facility] Adult PT Treatment/Exercise - 02/16/19 0001      Exercises   Exercises  Other Exercises;Lumbar    Other Exercises   reviewed current home program: SKTC, low trunk rotation, thoracic rotation stretch      Lumbar Exercises: Seated   Other Seated Lumbar Exercises  thoracic extension over towel roll x10 reps, HEP demo              PT Education - 02/16/19 1043    Education Details  modifications to activity; reviewed and adjusted HEP with handout; PT POC/frequency    Person(s) Educated  Patient    Methods  Explanation;Verbal cues;Handout    Comprehension  Verbalized understanding;Returned demonstration       PT Short Term Goals - 02/16/19 1008      PT SHORT TERM GOAL #1   Title  Pt will demo consistency and independence with his initial HEP to increase flexibility and strength.    Time  3    Period  Weeks    Status  New    Target Date  03/09/19      PT SHORT TERM GOAL #2   Title  Pt will report being able to return to his daily walking atleast 10 minutes at a time without the need for a rest break.    Time  3    Period  Weeks    Status  New        PT Long Term Goals - 02/16/19 1052      PT LONG TERM GOAL #1   Title  Pt will have 5/5 MMT strength of the LEs which will improve his efficiency with daily activity.    Time  6    Period  Weeks  Status  New    Target Date  04/01/19      PT LONG TERM GOAL #2   Title  Pt will have improved lumbar flexibility evident by his ability to actively extend beyond neutral in standing, with or without discomfort.    Time  6    Period  Weeks    Status  New      PT LONG TERM GOAL #3   Title  Pt will have improved trunk strength and endurance, evident by his ability to maintain upright posture to neutral during his entire session.    Time   6    Period  Weeks    Status  New      PT LONG TERM GOAL #4   Title  Pt will be able to complete atleast 30 minutes of shopping without increase in his low back pain once returning home.    Time  6    Period  Weeks    Status  New      PT LONG TERM GOAL #5   Title  Pt will report atleast 50% improvement in his pain and stiffness from the start of PT which will allow him to complete daily activity around his home with less limitation.    Time  6    Period  Weeks    Status  New             Plan - 02/16/19 1044    Clinical Impression Statement  Pt is a 73 y.o M referred to OPPT with complaints of low back pain and buttock pain onset in February of this year. Pt has been working on his own exercises and received lumbar facet injections with some improvement. His primary complaint is stiffness and pain throughout the day, particularly when waking up and with prolonged standing/walking. Pt has flexed trunk posture at rest, with significant limitations in active lumbar extension and rotation. In addition, pt has limited hip flexor flexibility and hypomobile lumbar/thoracic spine. He typically walks 20 minutes daily but has been unable to do this secondary to pain and fatigue. He would benefit from skilled PT to educate on proper body mechanics and address limitations in trunk/LE strength, flexibility and promote his return to regular daily activity with less pain.    Personal Factors and Comorbidities  Age;Fitness    Examination-Activity Limitations  Stand    Examination-Participation Restrictions  Shop;Yard Work    Stability/Clinical Decision Making  Stable/Uncomplicated    Clinical Decision Making  Low    Rehab Potential  Good    PT Frequency  2x / week    PT Duration  6 weeks    PT Treatment/Interventions  ADLs/Self Care Home Management;Aquatic Therapy;Moist Heat;Cryotherapy;Functional mobility training;Neuromuscular re-education;Therapeutic exercise;Therapeutic activities;Patient/family  education;Manual techniques;Dry needling    PT Next Visit Plan  Needs FOTO; f/u on HEP adjustments; trunk strengthening; hip flexor stretch; trial prone on elbows stretch instead of pt's standing extension he is completing at home    PT Home Exercise Plan  [email or web address]    Consulted and Agree with Plan of Care  Patient       Patient will benefit from skilled therapeutic intervention in order to improve the following deficits and impairments:  Decreased endurance, Hypomobility, Increased muscle spasms, Improper body mechanics, Decreased range of motion, Decreased activity tolerance, Decreased strength, Impaired flexibility, Postural dysfunction, Pain  Visit Diagnosis: Low back pain, unspecified back pain laterality, unspecified chronicity, unspecified whether sciatica present  Muscle weakness (generalized)  Abnormal posture  Problem List Patient Active Problem List   Diagnosis Date Noted  . Atherosclerosis of aorta ([place]) 11/30/2018  . Encounter for long-term (current) use of high-risk medication 03/18/2018  . Drug-induced erectile dysfunction 09/25/2015  . HIV positive ([place]) 08/21/2010  . Hyperlipidemia 08/21/2010  . Psoriatic arthritis ([place]) 08/21/2010  . ASHD (arteriosclerotic heart disease) 08/21/2010  . Herpes labialis 08/21/2010  . Allergic rhinitis due to pollen 08/21/2010  . GERD (gastroesophageal reflux disease) 08/21/2010  . Kidney stones 08/21/2010   10:58 AM,02/16/19 Sherol Dade PT, DPT [facility] [address] [place], [place], [address] Phone: [phone]   Fax:  [phone]  Name: [name] MRN: [id number] Date of Birth: [birth year]

## 2019-02-16 NOTE — Patient Instructions (Signed)
Access Code: [email or web address]  URL: [email or web address]  Date: 02/16/2019  Prepared by: Sherol Dade   Exercises  Supine Lower Trunk Rotation - 10 reps - 3 sets - 3x daily - 7x weekly  Sidelying Thoracic and Shoulder Rotation - 10 reps - 3 sets - 3x daily - 7x weekly  Seated Thoracic Lumbar Extension - 10 reps - 3x daily - 7x weekly    [place] [address], [place], [place] [address] Phone # [phone] Fax [phone]

## 2019-02-18 ENCOUNTER — Other Ambulatory Visit: Payer: Self-pay

## 2019-02-18 ENCOUNTER — Ambulatory Visit: Payer: Medicare Other | Admitting: Physical Therapy

## 2019-02-18 ENCOUNTER — Encounter: Payer: Self-pay | Admitting: Physical Therapy

## 2019-02-18 DIAGNOSIS — M545 Low back pain, unspecified: Secondary | ICD-10-CM

## 2019-02-18 DIAGNOSIS — R293 Abnormal posture: Secondary | ICD-10-CM

## 2019-02-18 DIAGNOSIS — M6281 Muscle weakness (generalized): Secondary | ICD-10-CM

## 2019-02-18 NOTE — Therapy (Signed)
[facility] [address], [place], [place], [address] Phone: [phone]   Fax:  [phone]  Physical Therapy Treatment  Patient Details  Name: [name] MRN: [id number] Date of Birth: June 28, 1945 Referring Provider (PT): Magnus Sinning, MD   Encounter Date: 02/18/2019  PT End of Session - 02/18/19 0921    Visit Number  2    Date for PT Re-Evaluation  04/01/19    Authorization Type  [employer] Medicare    Authorization Time Period  02/16/19 to 04/01/19    PT Start Time  0922    PT Stop Time  1018    PT Time Calculation (min)  56 min    Activity Tolerance  Patient tolerated treatment well    Behavior During Therapy  [facility] for tasks assessed/performed       Past Medical History:  Diagnosis Date  . Allergy    RHINITIS  . ASHD (arteriosclerotic heart disease)   . Former smoker QUIT 08/09/2010  . Herpes labialis   . HH (hiatus hernia)   . HIV infection ([place])    POSITIVE  . Hypertriglyceridemia   . Psoriasis    ARTHRITIS  . Renal stones   . Tinnitus    CHRONIC    Past Surgical History:  Procedure Laterality Date  . CORONARY ARTERY BYPASS GRAFT  2001    There were no vitals filed for this visit.  Subjective Assessment - 02/18/19 0923    Subjective  Felt good after eval, doing my HEP. I can tell a slight difference when I got up this morning.    Pertinent History  psoriatic arthritis    Currently in Pain?  No/denies   Not currently   Multiple Pain Sites  No                       [facility] Adult PT Treatment/Exercise - 02/18/19 0001      Self-Care   Self-Care  ADL's    ADL's  Avoiding stooping with mopping, vacuuming, sweeping, taking short rest breaks, sleping positions including usiing pillows b/t knees      Lumbar Exercises: Stretches   Active Hamstring Stretch  Right;Left;2 reps;30 seconds    Active Hamstring Stretch Limitations  Seated    Hip Flexor Stretch   Right;Left;2 reps;20 seconds   Hanging the leg off side of bed/mat   Hip Flexor Stretch Limitations  Rt > LT    Figure 4 Stretch  2 reps;30 seconds    Figure 4 Stretch Limitations  Used red physioball to support opposite LE     Other Lumbar Stretch Exercise  Rt hip ER release supine    Other Lumbar Stretch Exercise  Sidelying open book stretch 8x bil               PT Short Term Goals - 02/16/19 1008      PT SHORT TERM GOAL #1   Title  Pt will demo consistency and independence with his initial HEP to increase flexibility and strength.    Time  3    Period  Weeks    Status  New    Target Date  03/09/19      PT SHORT TERM GOAL #2   Title  Pt will report being able to return to his daily walking atleast 10 minutes at a time without the need for a rest break.    Time  3    Period  Weeks  Status  New        PT Long Term Goals - 02/16/19 1052      PT LONG TERM GOAL #1   Title  Pt will have 5/5 MMT strength of the LEs which will improve his efficiency with daily activity.    Time  6    Period  Weeks    Status  New    Target Date  04/01/19      PT LONG TERM GOAL #2   Title  Pt will have improved lumbar flexibility evident by his ability to actively extend beyond neutral in standing, with or without discomfort.    Time  6    Period  Weeks    Status  New      PT LONG TERM GOAL #3   Title  Pt will have improved trunk strength and endurance, evident by his ability to maintain upright posture to neutral during his entire session.    Time  6    Period  Weeks    Status  New      PT LONG TERM GOAL #4   Title  Pt will be able to complete atleast 30 minutes of shopping without increase in his low back pain once returning home.    Time  6    Period  Weeks    Status  New      PT LONG TERM GOAL #5   Title  Pt will report atleast 50% improvement in his pain and stiffness from the start of PT which will allow him to complete daily activity around his home with less  limitation.    Time  6    Period  Weeks    Status  New            Plan - 02/18/19 [id number]    Clinical Impression Statement  Pt arrives today with no pain. He is compliant with his his initial HEP. Pt was educated in lumbar protective and supportive bodu mechanics for ADLs specificaly sweeping, vacuuming, washing dishes, and mopping. PTA added hip flexor and hamstring seated stretches to HEP today RT > LT . PTA was not able to access [place] today so pt did not get any pictures. Will need next session. Pt had significant issues keeping his back neutral during seated hamstring stretching. He defaults to rounding his spine often.    Personal Factors and Comorbidities  Age;Fitness    Examination-Activity Limitations  Stand    Examination-Participation Restrictions  Shop;Yard Work    Stability/Clinical Decision Making  Stable/Uncomplicated    Rehab Potential  Good    PT Frequency  2x / week    PT Duration  6 weeks    PT Treatment/Interventions  ADLs/Self Care Home Management;Aquatic Therapy;Moist Heat;Cryotherapy;Functional mobility training;Neuromuscular re-education;Therapeutic exercise;Therapeutic activities;Patient/family education;Manual techniques;Dry needling    PT Next Visit Plan  Give pictures for RT hip flexor and hamstring stretches, no acces to medbridge today.Review stretches for RT hip. Follow up with pt changing the way he approaches hisADLS ( short rest breaks etc..)    PT Home Exercise Plan  [email or web address]    Consulted and Agree with Plan of Care  Patient       Patient will benefit from skilled therapeutic intervention in order to improve the following deficits and impairments:  Decreased endurance, Hypomobility, Increased muscle spasms, Improper body mechanics, Decreased range of motion, Decreased activity tolerance, Decreased strength, Impaired flexibility, Postural dysfunction, Pain  Visit Diagnosis: Low back pain, unspecified back pain laterality, unspecified chronicity,  unspecified whether sciatica present  Muscle weakness (generalized)  Abnormal posture     Problem List Patient Active Problem List   Diagnosis Date Noted  . Atherosclerosis of aorta ([place]) 11/30/2018  . Encounter for long-term (current) use of high-risk medication 03/18/2018  . Drug-induced erectile dysfunction 09/25/2015  . HIV positive ([place]) 08/21/2010  . Hyperlipidemia 08/21/2010  . Psoriatic arthritis ([place]) 08/21/2010  . ASHD (arteriosclerotic heart disease) 08/21/2010  . Herpes labialis 08/21/2010  . Allergic rhinitis due to pollen 08/21/2010  . GERD (gastroesophageal reflux disease) 08/21/2010  . Kidney stones 08/21/2010    Murice Barbar, PTA 02/18/2019, 10:25 AM  [facility] [address] [place], [place], [address] Phone: [phone]   Fax:  [phone]  Name: [name] MRN: [id number] Date of Birth: 09/02/1945

## 2019-02-21 ENCOUNTER — Other Ambulatory Visit: Payer: Self-pay

## 2019-02-21 ENCOUNTER — Encounter: Payer: Self-pay | Admitting: Physical Therapy

## 2019-02-21 ENCOUNTER — Ambulatory Visit: Payer: Medicare Other | Admitting: Physical Therapy

## 2019-02-21 DIAGNOSIS — M545 Low back pain, unspecified: Secondary | ICD-10-CM

## 2019-02-21 DIAGNOSIS — R293 Abnormal posture: Secondary | ICD-10-CM

## 2019-02-21 DIAGNOSIS — M6281 Muscle weakness (generalized): Secondary | ICD-10-CM

## 2019-02-21 NOTE — Therapy (Signed)
[facility] [address] [place], [place], [address] Phone: [phone]   Fax:  [phone]  Physical Therapy Treatment  Patient Details  Name: [name] MRN: [id number] Date of Birth: [birth year] Referring Provider (PT): Magnus Sinning, MD   Encounter Date: 02/21/2019  PT End of Session - 02/21/19 1444    Visit Number  3    Date for PT Re-Evaluation  04/01/19    Authorization Type  [employer] Medicare    Authorization Time Period  02/16/19 to 04/01/19    PT Start Time  1444    PT Stop Time  1524    PT Time Calculation (min)  40 min    Activity Tolerance  Patient tolerated treatment well    Behavior During Therapy  [facility] for tasks assessed/performed       Past Medical History:  Diagnosis Date  . Allergy    RHINITIS  . ASHD (arteriosclerotic heart disease)   . Former smoker QUIT 08/09/2010  . Herpes labialis   . HH (hiatus hernia)   . HIV infection ([place])    POSITIVE  . Hypertriglyceridemia   . Psoriasis    ARTHRITIS  . Renal stones   . Tinnitus    CHRONIC    Past Surgical History:  Procedure Laterality Date  . CORONARY ARTERY BYPASS GRAFT  2001    There were no vitals filed for this visit.  Subjective Assessment - 02/21/19 1447    Subjective  No meloxicam in 2 days and walked 1/2 block the other day. I woke up this AM with not much stiffness.    Pertinent History  psoriatic arthritis    Currently in Pain?  No/denies                       [facility] Adult PT Treatment/Exercise - 02/21/19 0001      Lumbar Exercises: Stretches   Active Hamstring Stretch  Right;Left;2 reps;30 seconds    Active Hamstring Stretch Limitations  Seated    Lower Trunk Rotation  3 reps;20 seconds    Hip Flexor Stretch  Right;Left;2 reps;20 seconds   Hanging the leg off side of bed/mat   Figure 4 Stretch  2 reps;30 seconds    Figure 4 Stretch Limitations  Used red physioball to support  opposite LE     Other Lumbar Stretch Exercise  sidelying thoracic rotation Bil 20 sec Bil      Lumbar Exercises: Aerobic   Nustep  L3 x 6 min at end of session   "My thighs really feel this."      Lumbar Exercises: Standing   Shoulder Extension  Strengthening;Both;20 reps;Theraband    Theraband Level (Shoulder Extension)  Level 2 (Red)    Shoulder Extension Limitations  TC to facilitate the lats, pt tends to rtighten his QL and buttocks.              PT Education - 02/21/19 1504    Education Details  Figure 4 stretch, hip flexor, seated hamstring and standing shoulder extension with red band all added t oHEP    Person(s) Educated  Patient    Methods  Explanation;Demonstration;Tactile cues;Verbal cues;Handout    Comprehension  Returned demonstration;Verbalized understanding       PT Short Term Goals - 02/16/19 1008      PT SHORT TERM GOAL #1   Title  Pt will demo consistency and independence with his initial HEP to increase flexibility and strength.  Time  3    Period  Weeks    Status  New    Target Date  03/09/19      PT SHORT TERM GOAL #2   Title  Pt will report being able to return to his daily walking atleast 10 minutes at a time without the need for a rest break.    Time  3    Period  Weeks    Status  New        PT Long Term Goals - 02/16/19 1052      PT LONG TERM GOAL #1   Title  Pt will have 5/5 MMT strength of the LEs which will improve his efficiency with daily activity.    Time  6    Period  Weeks    Status  New    Target Date  04/01/19      PT LONG TERM GOAL #2   Title  Pt will have improved lumbar flexibility evident by his ability to actively extend beyond neutral in standing, with or without discomfort.    Time  6    Period  Weeks    Status  New      PT LONG TERM GOAL #3   Title  Pt will have improved trunk strength and endurance, evident by his ability to maintain upright posture to neutral during his entire session.    Time  6    Period   Weeks    Status  New      PT LONG TERM GOAL #4   Title  Pt will be able to complete atleast 30 minutes of shopping without increase in his low back pain once returning home.    Time  6    Period  Weeks    Status  New      PT LONG TERM GOAL #5   Title  Pt will report atleast 50% improvement in his pain and stiffness from the start of PT which will allow him to complete daily activity around his home with less limitation.    Time  6    Period  Weeks    Status  New            Plan - 02/21/19 1445    Clinical Impression Statement  Pt arrives pain free. He has not had to take Meloxicam in 2 days and continues to wake up with less stiffness in his back. We added more hip flexibility exercises to HEP as Medbridge was working today and pt got visual copies. Pt had difficulty facilitating his lats against the red tband. It required a lot of tactile cuing but he eventually got th efeel for the correct contraction. He tended to grip the QL and proximal glutes.    Personal Factors and Comorbidities  Age;Fitness    Examination-Activity Limitations  Stand    Examination-Participation Restrictions  Shop;Yard Work    Stability/Clinical Decision Making  Stable/Uncomplicated    Rehab Potential  Good    PT Frequency  2x / week    PT Duration  6 weeks    PT Treatment/Interventions  ADLs/Self Care Home Management;Aquatic Therapy;Moist Heat;Cryotherapy;Functional mobility training;Neuromuscular re-education;Therapeutic exercise;Therapeutic activities;Patient/family education;Manual techniques;Dry needling    PT Next Visit Plan  review hip stretches given today for HEP. Review standing rows and make sure pt doing correctly.    PT Home Exercise Plan  [email or web address]    Consulted and Agree with Plan of Care  Patient       Patient  will benefit from skilled therapeutic intervention in order to improve the following deficits and impairments:  Decreased endurance, Hypomobility, Increased muscle spasms, Improper  body mechanics, Decreased range of motion, Decreased activity tolerance, Decreased strength, Impaired flexibility, Postural dysfunction, Pain  Visit Diagnosis: Low back pain, unspecified back pain laterality, unspecified chronicity, unspecified whether sciatica present  Muscle weakness (generalized)  Abnormal posture     Problem List Patient Active Problem List   Diagnosis Date Noted  . Atherosclerosis of aorta ([place]) 11/30/2018  . Encounter for long-term (current) use of high-risk medication 03/18/2018  . Drug-induced erectile dysfunction 09/25/2015  . HIV positive ([place]) 08/21/2010  . Hyperlipidemia 08/21/2010  . Psoriatic arthritis ([place]) 08/21/2010  . ASHD (arteriosclerotic heart disease) 08/21/2010  . Herpes labialis 08/21/2010  . Allergic rhinitis due to pollen 08/21/2010  . GERD (gastroesophageal reflux disease) 08/21/2010  . Kidney stones 08/21/2010    Bridie Colquhoun, PTA 02/21/2019, 3:25 PM  [facility] [address] [place], [place], [address] Phone: [phone]   Fax:  [phone]  Name: [name] MRN: [id number] Date of Birth: Dec 15, 1945  Access Code: [email or web address]  URL: [email or web address]  Date: 02/21/2019  Prepared by: Myrene Galas   Exercises  Supine Lower Trunk Rotation - 10 reps - 3 sets - 3x daily - 7x weekly  Sidelying Thoracic and Shoulder Rotation - 10 reps - 3 sets - 3x daily - 7x weekly  Seated Thoracic Lumbar Extension - 10 reps - 3x daily - 7x weekly  Modified Thomas Stretch - 10 reps - 30 hold - 3x daily - 7x weekly  Seated Hamstring Stretch - 5 reps - 1 sets - 30 hold - 3x daily - 7x weekly

## 2019-02-23 ENCOUNTER — Encounter: Payer: Self-pay | Admitting: Physical Therapy

## 2019-02-23 ENCOUNTER — Other Ambulatory Visit: Payer: Self-pay

## 2019-02-23 ENCOUNTER — Ambulatory Visit: Payer: Medicare Other | Admitting: Physical Therapy

## 2019-02-23 DIAGNOSIS — M545 Low back pain, unspecified: Secondary | ICD-10-CM

## 2019-02-23 DIAGNOSIS — M6281 Muscle weakness (generalized): Secondary | ICD-10-CM

## 2019-02-23 DIAGNOSIS — R293 Abnormal posture: Secondary | ICD-10-CM

## 2019-02-23 NOTE — Therapy (Signed)
[facility] [address], [place], [place], [address] Phone: [phone]   Fax:  [phone]  Physical Therapy Treatment  Patient Details  Name: [name] MRN: [id number] Date of Birth: May 18, 1945 Referring Provider (PT): Magnus Sinning, MD   Encounter Date: 02/23/2019  PT End of Session - 02/23/19 0849    Visit Number  4    Date for PT Re-Evaluation  04/01/19    Authorization Type  [employer] Medicare    Authorization Time Period  02/16/19 to 04/01/19    PT Start Time  0836    PT Stop Time  0940    PT Time Calculation (min)  64 min    Activity Tolerance  Patient tolerated treatment well    Behavior During Therapy  [facility] for tasks assessed/performed       Past Medical History:  Diagnosis Date  . Allergy    RHINITIS  . ASHD (arteriosclerotic heart disease)   . Former smoker QUIT 08/09/2010  . Herpes labialis   . HH (hiatus hernia)   . HIV infection ([place])    POSITIVE  . Hypertriglyceridemia   . Psoriasis    ARTHRITIS  . Renal stones   . Tinnitus    CHRONIC    Past Surgical History:  Procedure Laterality Date  . CORONARY ARTERY BYPASS GRAFT  2001    There were no vitals filed for this visit.  Subjective Assessment - 02/23/19 0850    Subjective  I am very stiff this morning. This is very early for me. I only have a little discomfort.    Pertinent History  psoriatic arthritis    Patient Stated Goals  loosen up his back    Currently in Pain?  --   Pt denies "pain" and reports he has discomfort in his back.                      [facility] Adult PT Treatment/Exercise - 02/23/19 0001      Lumbar Exercises: Stretches   Active Hamstring Stretch  Right;Left;3 reps;30 seconds    Active Hamstring Stretch Limitations  Seated    Single Knee to Chest Stretch  Right;Left;3 reps;30 seconds    Lower Trunk Rotation  3 reps;20 seconds   10x rocks slowly side to side then static  stretches   Hip Flexor Stretch  Right;Left;2 reps;30 seconds   Hanging the leg off side of bed/mat   Standing Side Bend  Right;Left;3 reps;10 seconds    Figure 4 Stretch  2 reps;30 seconds    Figure 4 Stretch Limitations  Used red physioball to support opposite LE     Other Lumbar Stretch Exercise  Seate snake/cat/camel for HEP    Other Lumbar Stretch Exercise  sidelying thoracic rotation Bil 20 sec Bil      Manual Therapy   Soft tissue mobilization  Soft tissue mobilization with Adaday assit to bil lumbar paraspinals and Rt glute/piriformis.   pt sidelying wiht pillo wbetweem knees            PT Education - 02/23/19 0911    Education Details  Seated cat/camel for HEP    Person(s) Educated  Patient    Methods  Explanation;Demonstration;Verbal cues;Handout    Comprehension  Returned demonstration;Verbalized understanding       PT Short Term Goals - 02/23/19 0901      PT SHORT TERM GOAL #1   Title  Pt will demo consistency and independence with his  initial HEP to increase flexibility and strength.    Time  3    Period  Weeks    Status  Achieved    Target Date  03/09/19        PT Long Term Goals - 02/16/19 1052      PT LONG TERM GOAL #1   Title  Pt will have 5/5 MMT strength of the LEs which will improve his efficiency with daily activity.    Time  6    Period  Weeks    Status  New    Target Date  04/01/19      PT LONG TERM GOAL #2   Title  Pt will have improved lumbar flexibility evident by his ability to actively extend beyond neutral in standing, with or without discomfort.    Time  6    Period  Weeks    Status  New      PT LONG TERM GOAL #3   Title  Pt will have improved trunk strength and endurance, evident by his ability to maintain upright posture to neutral during his entire session.    Time  6    Period  Weeks    Status  New      PT LONG TERM GOAL #4   Title  Pt will be able to complete atleast 30 minutes of shopping without increase in his low  back pain once returning home.    Time  6    Period  Weeks    Status  New      PT LONG TERM GOAL #5   Title  Pt will report atleast 50% improvement in his pain and stiffness from the start of PT which will allow him to complete daily activity around his home with less limitation.    Time  6    Period  Weeks    Status  New            Plan - 02/23/19 0849    Clinical Impression Statement  Pt arrives this AM with complaints of feeling very stiff in his back. He walks with very little trunk movement this morning. he doesn't have much pain. Treatment focused mainly on prolonged stretches and articulation spine movements ( seated cat camel). Session ended with Addaday assited soft tissue work to lumbar paraspinals and Rt glute. Pt reported it felt best in the middle of his Rt buttock.    Personal Factors and Comorbidities  Age;Fitness    Examination-Activity Limitations  Stand    Examination-Participation Restrictions  Shop;Yard Work    Stability/Clinical Decision Making  Stable/Uncomplicated    Rehab Potential  Good    PT Frequency  2x / week    PT Duration  6 weeks    PT Treatment/Interventions  ADLs/Self Care Home Management;Aquatic Therapy;Moist Heat;Cryotherapy;Functional mobility training;Neuromuscular re-education;Therapeutic exercise;Therapeutic activities;Patient/family education;Manual techniques;Dry needling    PT Next Visit Plan  Review seated cat camel, standing trunk extension strength try clamshells either supine or sidelying.    PT Home Exercise Plan  [email or web address]    Consulted and Agree with Plan of Care  Patient       Patient will benefit from skilled therapeutic intervention in order to improve the following deficits and impairments:  Decreased endurance, Hypomobility, Increased muscle spasms, Improper body mechanics, Decreased range of motion, Decreased activity tolerance, Decreased strength, Impaired flexibility, Postural dysfunction, Pain  Visit Diagnosis: Low back  pain, unspecified back pain laterality, unspecified chronicity, unspecified whether sciatica present  Muscle weakness (generalized)  Abnormal posture     Problem List Patient Active Problem List   Diagnosis Date Noted  . Atherosclerosis of aorta ([place]) 11/30/2018  . Encounter for long-term (current) use of high-risk medication 03/18/2018  . Drug-induced erectile dysfunction 09/25/2015  . HIV positive ([place]) 08/21/2010  . Hyperlipidemia 08/21/2010  . Psoriatic arthritis ([place]) 08/21/2010  . ASHD (arteriosclerotic heart disease) 08/21/2010  . Herpes labialis 08/21/2010  . Allergic rhinitis due to pollen 08/21/2010  . GERD (gastroesophageal reflux disease) 08/21/2010  . Kidney stones 08/21/2010    Copelyn Widmer, PTA 02/23/2019, 9:45 AM  [facility] [address] [place], [place], [address] Phone: [phone]   Fax:  [phone]  Name: Malyke Yannuzzi MRN: [id number] Date of Birth: 1945/10/31  Access Code: [email or web address]  URL: [email or web address]  Date: 02/23/2019  Prepared by: Myrene Galas   Exercises  Supine Lower Trunk Rotation - 10 reps - 3 sets - 3x daily - 7x weekly  Sidelying Thoracic and Shoulder Rotation - 10 reps - 3 sets - 3x daily - 7x weekly  Seated Thoracic Lumbar Extension - 10 reps - 3x daily - 7x weekly  Modified Thomas Stretch - 10 reps - 30 hold - 3x daily - 7x weekly  Seated Hamstring Stretch - 5 reps - 1 sets - 30 hold - 3x daily - 7x weekly  Standing Shoulder Extension with Resistance - 10 reps - 2 sets - 2x daily - 7x weekly  Supine Figure 4 Piriformis Stretch - 5 reps - 3 sets - 20 hold - 3x daily - 7x weekly  Seated Cat Camel - 10 reps - 3x daily - 7x weekly

## 2019-02-28 ENCOUNTER — Other Ambulatory Visit: Payer: Self-pay

## 2019-02-28 ENCOUNTER — Encounter: Payer: Self-pay | Admitting: Physical Therapy

## 2019-02-28 ENCOUNTER — Ambulatory Visit: Payer: Medicare Other | Admitting: Physical Therapy

## 2019-02-28 DIAGNOSIS — M545 Low back pain, unspecified: Secondary | ICD-10-CM

## 2019-02-28 DIAGNOSIS — M6281 Muscle weakness (generalized): Secondary | ICD-10-CM

## 2019-02-28 DIAGNOSIS — R293 Abnormal posture: Secondary | ICD-10-CM

## 2019-02-28 NOTE — Therapy (Signed)
[facility] [address], [place], [place], [address] Phone: [phone]   Fax:  [phone]  Physical Therapy Treatment  Patient Details  Name: [name] MRN: [id number] Date of Birth: 01-28-1946 Referring Provider (PT): Magnus Sinning, MD   Encounter Date: 02/28/2019  PT End of Session - 02/28/19 1013    Visit Number  5    Date for PT Re-Evaluation  04/01/19    Authorization Type  [employer] Medicare    Authorization Time Period  02/16/19 to 04/01/19    PT Start Time  1013    PT Stop Time  1100    PT Time Calculation (min)  47 min    Activity Tolerance  Patient tolerated treatment well    Behavior During Therapy  [facility] for tasks assessed/performed       Past Medical History:  Diagnosis Date  . Allergy    RHINITIS  . ASHD (arteriosclerotic heart disease)   . Former smoker QUIT 08/09/2010  . Herpes labialis   . HH (hiatus hernia)   . HIV infection ([place])    POSITIVE  . Hypertriglyceridemia   . Psoriasis    ARTHRITIS  . Renal stones   . Tinnitus    CHRONIC    Past Surgical History:  Procedure Laterality Date  . CORONARY ARTERY BYPASS GRAFT  2001    There were no vitals filed for this visit.  Subjective Assessment - 02/28/19 1017    Subjective  I had a great day Friday so then I overdid my exercises and "felt it" Saturday.    Pertinent History  psoriatic arthritis    Limitations  House hold activities    How long can you walk comfortably?  30 minutes (back will stiffen up)    Diagnostic tests  xray    Currently in Pain?  No/denies                       [facility] Adult PT Treatment/Exercise - 02/28/19 0001      Lumbar Exercises: Stretches   Active Hamstring Stretch  Right;Left;3 reps;30 seconds    Active Hamstring Stretch Limitations  Seated   VC for less rounded back   Other Lumbar Stretch Exercise  Seated cat/Camel 6x review, VC for more extension with tthe  hinge foward      Lumbar Exercises: Aerobic   Nustep  L1 x 6 min, VC to go at a more reasonable pace   Lowered reisistance as pt's quads were too sore ( for him)     Lumbar Exercises: Supine   Clam  10 reps    Clam Limitations  green band given foe HEP    Bridge  10 reps;2 seconds    Bridge Limitations  Given for HEP      Lumbar Exercises: Sidelying   Hip Abduction  Both;10 reps;2 seconds    Hip Abduction Limitations  HEP               PT Short Term Goals - 02/23/19 0901      PT SHORT TERM GOAL #1   Title  Pt will demo consistency and independence with his initial HEP to increase flexibility and strength.    Time  3    Period  Weeks    Status  Achieved    Target Date  03/09/19        PT Long Term Goals - 02/16/19 1052      PT LONG  TERM GOAL #1   Title  Pt will have 5/5 MMT strength of the LEs which will improve his efficiency with daily activity.    Time  6    Period  Weeks    Status  New    Target Date  04/01/19      PT LONG TERM GOAL #2   Title  Pt will have improved lumbar flexibility evident by his ability to actively extend beyond neutral in standing, with or without discomfort.    Time  6    Period  Weeks    Status  New      PT LONG TERM GOAL #3   Title  Pt will have improved trunk strength and endurance, evident by his ability to maintain upright posture to neutral during his entire session.    Time  6    Period  Weeks    Status  New      PT LONG TERM GOAL #4   Title  Pt will be able to complete atleast 30 minutes of shopping without increase in his low back pain once returning home.    Time  6    Period  Weeks    Status  New      PT LONG TERM GOAL #5   Title  Pt will report atleast 50% improvement in his pain and stiffness from the start of PT which will allow him to complete daily activity around his home with less limitation.    Time  6    Period  Weeks    Status  New            Plan - 02/28/19 1016    Clinical Impression  Statement  Pt reports the stretches have helped with 60%-70% less paina and stiffness. Today we aded hip strength with core stabilization bias to his HEP for progression.Pt reports he feelsl most weakness in his quad muscles. No pain with exercises duirng the session.    Personal Factors and Comorbidities  Age;Fitness    Examination-Activity Limitations  Stand    Examination-Participation Restrictions  Shop;Yard Work    Stability/Clinical Decision Making  Stable/Uncomplicated    Rehab Potential  Good    PT Frequency  2x / week    PT Duration  6 weeks    PT Treatment/Interventions  ADLs/Self Care Home Management;Aquatic Therapy;Moist Heat;Cryotherapy;Functional mobility training;Neuromuscular re-education;Therapeutic exercise;Therapeutic activities;Patient/family education;Manual techniques;Dry needling    PT Next Visit Plan  review new HEP given today. Try sit to stand for more dynamic exercise that incorporates his quads. review goals.    PT Home Exercise Plan  [email or web address]    Consulted and Agree with Plan of Care  Patient       Patient will benefit from skilled therapeutic intervention in order to improve the following deficits and impairments:  Decreased endurance, Hypomobility, Increased muscle spasms, Improper body mechanics, Decreased range of motion, Decreased activity tolerance, Decreased strength, Impaired flexibility, Postural dysfunction, Pain  Visit Diagnosis: Low back pain, unspecified back pain laterality, unspecified chronicity, unspecified whether sciatica present  Muscle weakness (generalized)  Abnormal posture     Problem List Patient Active Problem List   Diagnosis Date Noted  . Atherosclerosis of aorta ([place]) 11/30/2018  . Encounter for long-term (current) use of high-risk medication 03/18/2018  . Drug-induced erectile dysfunction 09/25/2015  . HIV positive ([place]) 08/21/2010  . Hyperlipidemia 08/21/2010  . Psoriatic arthritis ([place]) 08/21/2010  . ASHD  (arteriosclerotic heart disease) 08/21/2010  . Herpes labialis 08/21/2010  . Allergic  rhinitis due to pollen 08/21/2010  . GERD (gastroesophageal reflux disease) 08/21/2010  . Kidney stones 08/21/2010    Melrose Kearse, PTA 02/28/2019, 10:51 AM  [facility] [address] [place], [place], [address] Phone: [phone]   Fax:  [phone]  Name: [name] MRN: [id number] Date of Birth: 03-08-1946 Access Code: [email or web address]  URL: [email or web address]  Date: 02/28/2019  Prepared by: Myrene Galas   Exercises  Supine Lower Trunk Rotation - 10 reps - 3 sets - 3x daily - 7x weekly  Sidelying Thoracic and Shoulder Rotation - 10 reps - 3 sets - 3x daily - 7x weekly  Seated Thoracic Lumbar Extension - 10 reps - 3x daily - 7x weekly  Modified Thomas Stretch - 10 reps - 30 hold - 3x daily - 7x weekly  Seated Hamstring Stretch - 5 reps - 1 sets - 30 hold - 3x daily - 7x weekly  Standing Shoulder Extension with Resistance - 10 reps - 2 sets - 2x daily - 7x weekly  Supine Figure 4 Piriformis Stretch - 5 reps - 3 sets - 20 hold - 3x daily - 7x weekly  Seated Cat Camel - 10 reps - 3x daily - 7x weekly  Supine Transversus Abdominis Bracing with Double Leg Fallout - 15 reps - 2 sets - 2 hold - 1x daily - 7x weekly  Supine Bridge - 10 reps - 1 sets - 5 hold - 1x daily - 7x weekly  Sidelying Diagonal Hip Abduction - 10 reps - 1 sets - 1x daily - 7x weekly

## 2019-03-02 ENCOUNTER — Encounter: Payer: Self-pay | Admitting: Physical Therapy

## 2019-03-02 ENCOUNTER — Other Ambulatory Visit: Payer: Self-pay | Admitting: Physical Medicine and Rehabilitation

## 2019-03-02 ENCOUNTER — Ambulatory Visit: Payer: Medicare Other | Admitting: Physical Therapy

## 2019-03-02 ENCOUNTER — Other Ambulatory Visit: Payer: Self-pay

## 2019-03-02 DIAGNOSIS — R293 Abnormal posture: Secondary | ICD-10-CM

## 2019-03-02 DIAGNOSIS — M545 Low back pain, unspecified: Secondary | ICD-10-CM

## 2019-03-02 DIAGNOSIS — M6281 Muscle weakness (generalized): Secondary | ICD-10-CM

## 2019-03-02 NOTE — Telephone Encounter (Signed)
I thought I had sent in refill of this recently, ok for one more time, but yes next time will need to speak with Dr. Redmond School

## 2019-03-02 NOTE — Telephone Encounter (Signed)
Please advise. I advised patient that after last refill he would need to contact his PCP.

## 2019-03-02 NOTE — Therapy (Signed)
[facility] [address], [place], [place], [address] Phone: [phone]   Fax:  [phone]  Physical Therapy Treatment  Patient Details  Name: [name] MRN: [id number] Date of Birth: 1945/03/25 Referring Provider (PT): Magnus Sinning, MD   Encounter Date: 03/02/2019  PT End of Session - 03/02/19 0932    Visit Number  6    Date for PT Re-Evaluation  04/01/19    Authorization Type  [employer] Medicare    Authorization Time Period  02/16/19 to 04/01/19    PT Start Time  0928    PT Stop Time  1012    PT Time Calculation (min)  44 min    Activity Tolerance  Patient tolerated treatment well    Behavior During Therapy  [facility] for tasks assessed/performed       Past Medical History:  Diagnosis Date  . Allergy    RHINITIS  . ASHD (arteriosclerotic heart disease)   . Former smoker QUIT 08/09/2010  . Herpes labialis   . HH (hiatus hernia)   . HIV infection ([place])    POSITIVE  . Hypertriglyceridemia   . Psoriasis    ARTHRITIS  . Renal stones   . Tinnitus    CHRONIC    Past Surgical History:  Procedure Laterality Date  . CORONARY ARTERY BYPASS GRAFT  2001    There were no vitals filed for this visit.  Subjective Assessment - 03/02/19 1057    Subjective  Pt states his exercises are going well. No pain currently. He feels he is standing taller during the day.    Pertinent History  psoriatic arthritis    Limitations  House hold activities    How long can you walk comfortably?  30 minutes (back will stiffen up)    Diagnostic tests  xray    Currently in Pain?  No/denies         [facility] PT Assessment - 03/02/19 0001      Flexibility   Hamstrings  Rt: 30 deg lacking, Lt 35 deg lacking     Quadriceps  Lt 100 deg in prone                   [facility] Adult PT Treatment/Exercise - 03/02/19 0001      Lumbar Exercises: Aerobic   Nustep  Seat 8, L1 x4 min PT present to discuss       Lumbar Exercises: Standing   Other Standing Lumbar Exercises  BUE pressdown with green TB 2x10 reps     Other Standing Lumbar Exercises  partial tandem pallof press with red TB x15 reps each       Lumbar Exercises: Seated   Other Seated Lumbar Exercises  trunk extension over soft foam roll mid lumbar spine, low thoracic spine placement x5 reps each (gentle)       Manual Therapy   Manual Therapy  Joint mobilization    Joint Mobilization  Grade III-IV CPAs sacrum and T4 to L3 x2 bouts; Lt and Rt grade III-IV gapping/rotation in sidelying             PT Education - 03/02/19 1010    Education Details  increased resistance with green TB; technique with therex    Person(s) Educated  Patient    Methods  Explanation;Verbal cues    Comprehension  Verbalized understanding;Returned demonstration       PT Short Term Goals - 03/02/19 1041      PT SHORT TERM  GOAL #1   Title  Pt will demo consistency and independence with his initial HEP to increase flexibility and strength.    Time  3    Period  Weeks    Status  Achieved    Target Date  03/09/19      PT SHORT TERM GOAL #2   Title  Pt will report being able to return to his daily walking atleast 10 minutes at a time without the need for a rest break.    Time  3    Period  Weeks    Status  On-going        PT Long Term Goals - 03/02/19 1036      PT LONG TERM GOAL #1   Title  Pt will have 5/5 MMT strength of the LEs which will improve his efficiency with daily activity.    Time  6    Period  Weeks    Status  New      PT LONG TERM GOAL #2   Title  Pt will have improved lumbar flexibility evident by his ability to actively extend beyond neutral in standing, with or without discomfort.    Time  6    Period  Weeks    Status  New      PT LONG TERM GOAL #3   Title  Pt will have improved trunk strength and endurance, evident by his ability to maintain upright posture to neutral during his entire session.    Baseline  approximately  15 minutes    Time  6    Period  Weeks    Status  Partially Met      PT LONG TERM GOAL #4   Title  Pt will be able to complete atleast 30 minutes of shopping without increase in his low back pain once returning home.    Time  6    Period  Weeks    Status  New      PT LONG TERM GOAL #5   Title  Pt will report atleast 50% improvement in his pain and stiffness from the start of PT which will allow him to complete daily activity around his home with less limitation.    Time  6    Period  Weeks    Status  New            Plan - 03/02/19 1022    Clinical Impression Statement  Pt is making steady progress towards his goals. He has near 100 deg of prone Lt knee flexion and his hamstring flexibility has improved from 5-10 deg on each side. Pt still demonstrates flexed posture in standing despite PT verbal cuing. PT completed lumbar and thoracic spine mobilizations to increase extension mobility. Pt felt good at the end of the session, denying any buttock pain. Pt was provided green TB for UE extension due to not feeling much resistance with his red TB at home. Will continue with current POC moving forward.    Personal Factors and Comorbidities  Age;Fitness    Examination-Activity Limitations  Stand    Examination-Participation Restrictions  Shop;Yard Work    Stability/Clinical Decision Making  Stable/Uncomplicated    Rehab Potential  Good    PT Frequency  2x / week    PT Duration  6 weeks    PT Treatment/Interventions  ADLs/Self Care Home Management;Aquatic Therapy;Moist Heat;Cryotherapy;Functional mobility training;Neuromuscular re-education;Therapeutic exercise;Therapeutic activities;Patient/family education;Manual techniques;Dry needling    PT Next Visit Plan  review walking goals; functional hip  strengthening progression    PT Home Exercise Plan  [email or web address]    Consulted and Agree with Plan of Care  Patient       Patient will benefit from skilled therapeutic intervention in order to  improve the following deficits and impairments:  Decreased endurance, Hypomobility, Increased muscle spasms, Improper body mechanics, Decreased range of motion, Decreased activity tolerance, Decreased strength, Impaired flexibility, Postural dysfunction, Pain  Visit Diagnosis: Low back pain, unspecified back pain laterality, unspecified chronicity, unspecified whether sciatica present  Muscle weakness (generalized)  Abnormal posture     Problem List Patient Active Problem List   Diagnosis Date Noted  . Atherosclerosis of aorta ([place]) 11/30/2018  . Encounter for long-term (current) use of high-risk medication 03/18/2018  . Drug-induced erectile dysfunction 09/25/2015  . HIV positive ([place]) 08/21/2010  . Hyperlipidemia 08/21/2010  . Psoriatic arthritis ([place]) 08/21/2010  . ASHD (arteriosclerotic heart disease) 08/21/2010  . Herpes labialis 08/21/2010  . Allergic rhinitis due to pollen 08/21/2010  . GERD (gastroesophageal reflux disease) 08/21/2010  . Kidney stones 08/21/2010    Sherol Dade 03/02/2019, 10:59 AM  [facility] [address], [place], [place], [address] Phone: [phone]   Fax:  [phone]  Name: [name] MRN: [id number] Date of Birth: [birth year]

## 2019-03-07 ENCOUNTER — Other Ambulatory Visit: Payer: Self-pay

## 2019-03-07 ENCOUNTER — Encounter: Payer: Self-pay | Admitting: Physical Therapy

## 2019-03-07 ENCOUNTER — Ambulatory Visit: Payer: Medicare Other | Admitting: Physical Therapy

## 2019-03-07 DIAGNOSIS — M545 Low back pain, unspecified: Secondary | ICD-10-CM

## 2019-03-07 DIAGNOSIS — M6281 Muscle weakness (generalized): Secondary | ICD-10-CM

## 2019-03-07 DIAGNOSIS — R293 Abnormal posture: Secondary | ICD-10-CM

## 2019-03-07 NOTE — Therapy (Signed)
[facility] [address], [place], [place], [address] Phone: [phone]   Fax:  [phone]  Physical Therapy Treatment  Patient Details  Name: [name] MRN: [id number] Date of Birth: [birth year] Referring Provider (PT): Magnus Sinning, MD   Encounter Date: 03/07/2019  PT End of Session - 03/07/19 1105    Visit Number  7    Date for PT Re-Evaluation  04/01/19    Authorization Type  [employer] Medicare    Authorization Time Period  02/16/19 to 04/01/19    PT Start Time  1103    PT Stop Time  1143    PT Time Calculation (min)  40 min    Activity Tolerance  Patient tolerated treatment well    Behavior During Therapy  [facility] for tasks assessed/performed       Past Medical History:  Diagnosis Date  . Allergy    RHINITIS  . ASHD (arteriosclerotic heart disease)   . Former smoker QUIT 08/09/2010  . Herpes labialis   . HH (hiatus hernia)   . HIV infection ([place])    POSITIVE  . Hypertriglyceridemia   . Psoriasis    ARTHRITIS  . Renal stones   . Tinnitus    CHRONIC    Past Surgical History:  Procedure Laterality Date  . CORONARY ARTERY BYPASS GRAFT  2001    There were no vitals filed for this visit.  Subjective Assessment - 03/07/19 1107    Subjective  I really over did it over Chrstmas because I felt so good. It was the standing.My hamstrings are tight and sore.    Pertinent History  psoriatic arthritis    Currently in Pain?  Yes    Pain Score  3     Pain Location  --   Hamstrings   Aggravating Factors   standing too long    Pain Relieving Factors  Stretching                       [facility] Adult PT Treatment/Exercise - 03/07/19 0001      Lumbar Exercises: Stretches   Active Hamstring Stretch  Right;Left;3 reps;30 seconds    Active Hamstring Stretch Limitations  Seated   VC for less rounded back   Other Lumbar Stretch Exercise  Seated thoracic & lumbar ext over pink  roll 10x each       Lumbar Exercises: Aerobic   Nustep  L1 x 6 min, VC to go at a more reasonable pace   Lowered reisistance as pt's quads were too sore ( for him)     Lumbar Exercises: Standing   Other Standing Lumbar Exercises  Shld ext green 2x10 VC for postural set up     Other Standing Lumbar Exercises  partial tandem pallof press with red TB x10 reps each    Pt fatigued and could not hold his posture.              PT Short Term Goals - 03/02/19 1041      PT SHORT TERM GOAL #1   Title  Pt will demo consistency and independence with his initial HEP to increase flexibility and strength.    Time  3    Period  Weeks    Status  Achieved    Target Date  03/09/19      PT SHORT TERM GOAL #2   Title  Pt will report being able to return to his daily walking atleast  10 minutes at a time without the need for a rest break.    Time  3    Period  Weeks    Status  On-going        PT Long Term Goals - 03/02/19 1036      PT LONG TERM GOAL #1   Title  Pt will have 5/5 MMT strength of the LEs which will improve his efficiency with daily activity.    Time  6    Period  Weeks    Status  New      PT LONG TERM GOAL #2   Title  Pt will have improved lumbar flexibility evident by his ability to actively extend beyond neutral in standing, with or without discomfort.    Time  6    Period  Weeks    Status  New      PT LONG TERM GOAL #3   Title  Pt will have improved trunk strength and endurance, evident by his ability to maintain upright posture to neutral during his entire session.    Baseline  approximately 15 minutes    Time  6    Period  Weeks    Status  Partially Met      PT LONG TERM GOAL #4   Title  Pt will be able to complete atleast 30 minutes of shopping without increase in his low back pain once returning home.    Time  6    Period  Weeks    Status  New      PT LONG TERM GOAL #5   Title  Pt will report atleast 50% improvement in his pain and stiffness from the  start of PT which will allow him to complete daily activity around his home with less limitation.    Time  6    Period  Weeks    Status  New            Plan - 03/07/19 1105    Clinical Impression Statement  Pt reports overdoing his standing activity over the Christmas break due to " feeling really good." A few days his pain was up but he appears to have recovered from that flare up. He is compliant with his new HEP given last session. Pt significantly fatigued with standing Palloff exercises due to poor trunk stabilization/endurance. Pt expressed this fatigue/discomfort makes him want to slouch.    Personal Factors and Comorbidities  Age;Fitness    Examination-Activity Limitations  Stand    Examination-Participation Restrictions  Shop;Yard Work    Stability/Clinical Decision Making  Stable/Uncomplicated    Rehab Potential  Good    PT Frequency  2x / week    PT Duration  6 weeks    PT Treatment/Interventions  ADLs/Self Care Home Management;Aquatic Therapy;Moist Heat;Cryotherapy;Functional mobility training;Neuromuscular re-education;Therapeutic exercise;Therapeutic activities;Patient/family education;Manual techniques;Dry needling    PT Next Visit Plan  Functional hip strengthening progression, standing trunk stabilizatio/endurance    PT Home Exercise Plan  [email or web address]    Consulted and Agree with Plan of Care  Patient       Patient will benefit from skilled therapeutic intervention in order to improve the following deficits and impairments:  Decreased endurance, Hypomobility, Increased muscle spasms, Improper body mechanics, Decreased range of motion, Decreased activity tolerance, Decreased strength, Impaired flexibility, Postural dysfunction, Pain  Visit Diagnosis: Low back pain, unspecified back pain laterality, unspecified chronicity, unspecified whether sciatica present  Muscle weakness (generalized)  Abnormal posture     Problem List Patient Active  Problem List   Diagnosis  Date Noted  . Atherosclerosis of aorta ([place]) 11/30/2018  . Encounter for long-term (current) use of high-risk medication 03/18/2018  . Drug-induced erectile dysfunction 09/25/2015  . HIV positive ([place]) 08/21/2010  . Hyperlipidemia 08/21/2010  . Psoriatic arthritis ([place]) 08/21/2010  . ASHD (arteriosclerotic heart disease) 08/21/2010  . Herpes labialis 08/21/2010  . Allergic rhinitis due to pollen 08/21/2010  . GERD (gastroesophageal reflux disease) 08/21/2010  . Kidney stones 08/21/2010    Saman Umstead, PTA 03/07/2019, 2:31 PM  [facility] [address] [place], [place], [address] Phone: [phone]   Fax:  [phone]  Name: [name] MRN: [id number] Date of Birth: 11-25-1945

## 2019-03-09 ENCOUNTER — Ambulatory Visit: Payer: Medicare Other | Admitting: Physical Therapy

## 2019-03-09 ENCOUNTER — Other Ambulatory Visit: Payer: Self-pay

## 2019-03-09 ENCOUNTER — Encounter: Payer: Self-pay | Admitting: Physical Therapy

## 2019-03-09 DIAGNOSIS — M545 Low back pain, unspecified: Secondary | ICD-10-CM

## 2019-03-09 DIAGNOSIS — M6281 Muscle weakness (generalized): Secondary | ICD-10-CM

## 2019-03-09 DIAGNOSIS — R293 Abnormal posture: Secondary | ICD-10-CM

## 2019-03-09 NOTE — Patient Instructions (Signed)
Access Code: [email or web address]  URL: [email or web address]  Date: 03/09/2019  Prepared by: Sherol Dade    Exercises Supine Lower Trunk Rotation- 10 reps- 3 sets- 3x daily- 7x weekly  Sidelying Thoracic and Shoulder Rotation- 10 reps- 3 sets- 3x daily- 7x weekly  Seated Thoracic Lumbar Extension- 10 reps- 3x daily- 7x weekly  Modified Thomas Stretch- 10 reps- 30 hold- 3x daily- 7x weekly  Seated Hamstring Stretch- 5 reps- 1 sets- 30 hold- 3x daily- 7x weekly  Standing Shoulder Extension with Resistance- 10 reps- 2 sets- 2x daily- 7x weekly  Seated Cat Camel- 10 reps- 3x daily- 7x weekly  Supine Transversus Abdominis Bracing with Double Leg Fallout- 15 reps- 2 sets- 2 hold- 1x daily- 7x weekly  Supine Bridge- 10 reps- 1 sets- 5 hold- 1x daily- 7x weekly  Sidelying Diagonal Hip Abduction- 10 reps- 1 sets- 1x daily- 7x weekly  Access Code: [email or web address]  URL: [email or web address]  Date: 03/09/2019  Prepared by: Sherol Dade    Exercises Supine Lower Trunk Rotation- 10 reps- 3 sets- 3x daily- 7x weekly  Sidelying Thoracic and Shoulder Rotation- 10 reps- 3 sets- 3x daily- 7x weekly  Seated Thoracic Lumbar Extension- 10 reps- 3x daily- 7x weekly  Modified Thomas Stretch- 10 reps- 30 hold- 3x daily- 7x weekly  Seated Hamstring Stretch- 5 reps- 1 sets- 30 hold- 3x daily- 7x weekly  Standing Shoulder Extension with Resistance- 10 reps- 2 sets- 2x daily- 7x weekly  Seated Cat Camel- 10 reps- 3x daily- 7x weekly  Supine Transversus Abdominis Bracing with Double Leg Fallout- 15 reps- 2 sets- 2 hold- 1x daily- 7x weekly  Supine Bridge- 10 reps- 1 sets- 5 hold- 1x daily- 7x weekly  Sidelying Diagonal Hip Abduction- 10 reps- 1 sets- 1x daily- 7x weekly    [facility] [address], [place], [place] [address] Phone # [phone] Fax [phone]

## 2019-03-09 NOTE — Therapy (Signed)
[facility] [address], [place], [place], [address] Phone: [phone]   Fax:  [phone]  Physical Therapy Treatment  Patient Details  Name: [name] MRN: [id number] Date of Birth: 1946/03/10 Referring Provider (PT): Magnus Sinning, MD   Encounter Date: 03/09/2019  PT End of Session - 03/09/19 1016    Visit Number  8    Date for PT Re-Evaluation  04/01/19    Authorization Type  [employer] Medicare    Authorization Time Period  02/16/19 to 04/01/19    PT Start Time  1008    PT Stop Time  1049    PT Time Calculation (min)  41 min    Activity Tolerance  Patient tolerated treatment well    Behavior During Therapy  [facility] for tasks assessed/performed       Past Medical History:  Diagnosis Date  . Allergy    RHINITIS  . ASHD (arteriosclerotic heart disease)   . Former smoker QUIT 08/09/2010  . Herpes labialis   . HH (hiatus hernia)   . HIV infection ([place])    POSITIVE  . Hypertriglyceridemia   . Psoriasis    ARTHRITIS  . Renal stones   . Tinnitus    CHRONIC    Past Surgical History:  Procedure Laterality Date  . CORONARY ARTERY BYPASS GRAFT  2001    There were no vitals filed for this visit.  Subjective Assessment - 03/09/19 1012    Subjective  Pt states that things are going well. He has mild discomfort at the moment but nothing extreme. He is doing his HEP atleast 3x/day.    Pertinent History  psoriatic arthritis    Currently in Pain?  No/denies                       [facility] Adult PT Treatment/Exercise - 03/09/19 0001      Lumbar Exercises: Stretches   Hip Flexor Stretch  3 reps;Left;Right;20 seconds    Hip Flexor Stretch Limitations  standing with foot on 2nd step    Other Lumbar Stretch Exercise  lumbar Lt lateral glide x10 reps in doorway       Lumbar Exercises: Standing   Other Standing Lumbar Exercises  Rt hip extension 2x5 reps, Lt x10 reps with forearms  resting on countertop    PT cuing to prevent trunk rotation     Manual Therapy   Joint Mobilization  Grade III-IV CPAs sacrum and T4 to L3 x2 bouts; Lt and Rt grade III-IV gapping/rotation in sidelying             PT Education - 03/09/19 1052    Education Details  avoiding strengthening exercises 3x/day; technique with therex    Person(s) Educated  Patient    Methods  Explanation;Handout;Verbal cues    Comprehension  Verbalized understanding;Returned demonstration       PT Short Term Goals - 03/02/19 1041      PT SHORT TERM GOAL #1   Title  Pt will demo consistency and independence with his initial HEP to increase flexibility and strength.    Time  3    Period  Weeks    Status  Achieved    Target Date  03/09/19      PT SHORT TERM GOAL #2   Title  Pt will report being able to return to his daily walking atleast 10 minutes at a time without the need for a rest break.  Time  3    Period  Weeks    Status  On-going        PT Long Term Goals - 03/02/19 1036      PT LONG TERM GOAL #1   Title  Pt will have 5/5 MMT strength of the LEs which will improve his efficiency with daily activity.    Time  6    Period  Weeks    Status  New      PT LONG TERM GOAL #2   Title  Pt will have improved lumbar flexibility evident by his ability to actively extend beyond neutral in standing, with or without discomfort.    Time  6    Period  Weeks    Status  New      PT LONG TERM GOAL #3   Title  Pt will have improved trunk strength and endurance, evident by his ability to maintain upright posture to neutral during his entire session.    Baseline  approximately 15 minutes    Time  6    Period  Weeks    Status  Partially Met      PT LONG TERM GOAL #4   Title  Pt will be able to complete atleast 30 minutes of shopping without increase in his low back pain once returning home.    Time  6    Period  Weeks    Status  New      PT LONG TERM GOAL #5   Title  Pt will report atleast  50% improvement in his pain and stiffness from the start of PT which will allow him to complete daily activity around his home with less limitation.    Time  6    Period  Weeks    Status  New            Plan - 03/09/19 1052    Clinical Impression Statement  Pt arrived with minimal complaints of pain. Session focused on manual treatment to increase spine mobility. Pt denied increase in pain with this. Pt has been consistently completing his HEP, although he tends to do this 3x/week. PT reiterated the importance of focusing on prescribed reps/sets throughout the day. PT also reviewed technique with prone extension he is completing on his own to increase his awareness of symptoms response and avoid further irritation of the nerve. Pt verbalized and demonstrated understanding of this. No pain was reported end of session.    Personal Factors and Comorbidities  Age;Fitness    Examination-Activity Limitations  Stand    Examination-Participation Restrictions  Shop;Yard Work    Stability/Clinical Decision Making  Stable/Uncomplicated    Rehab Potential  Good    PT Frequency  2x / week    PT Duration  6 weeks    PT Treatment/Interventions  ADLs/Self Care Home Management;Aquatic Therapy;Moist Heat;Cryotherapy;Functional mobility training;Neuromuscular re-education;Therapeutic exercise;Therapeutic activities;Patient/family education;Manual techniques;Dry needling    PT Next Visit Plan  pt left green TB so he'll need a new one; gentle lumbar extension-monitoring symptoms response; Functional hip strengthening, standing trunk stabilization/endurance    PT Home Exercise Plan  [email or web address]    Consulted and Agree with Plan of Care  Patient       Patient will benefit from skilled therapeutic intervention in order to improve the following deficits and impairments:  Decreased endurance, Hypomobility, Increased muscle spasms, Improper body mechanics, Decreased range of motion, Decreased activity tolerance,  Decreased strength, Impaired flexibility, Postural dysfunction, Pain  Visit Diagnosis: Low back  pain, unspecified back pain laterality, unspecified chronicity, unspecified whether sciatica present  Abnormal posture  Muscle weakness (generalized)     Problem List Patient Active Problem List   Diagnosis Date Noted  . Atherosclerosis of aorta ([place]) 11/30/2018  . Encounter for long-term (current) use of high-risk medication 03/18/2018  . Drug-induced erectile dysfunction 09/25/2015  . HIV positive ([place]) 08/21/2010  . Hyperlipidemia 08/21/2010  . Psoriatic arthritis ([place]) 08/21/2010  . ASHD (arteriosclerotic heart disease) 08/21/2010  . Herpes labialis 08/21/2010  . Allergic rhinitis due to pollen 08/21/2010  . GERD (gastroesophageal reflux disease) 08/21/2010  . Kidney stones 08/21/2010   11:01 AM,03/09/19 Sherol Dade PT, DPT [facility] [address], [place], [place], [address] Phone: [phone]   Fax:  [phone]  Name: [name] MRN: [id number] Date of Birth: 11-01-1945

## 2019-03-16 ENCOUNTER — Other Ambulatory Visit: Payer: Self-pay

## 2019-03-16 ENCOUNTER — Encounter: Payer: Self-pay | Admitting: Physical Therapy

## 2019-03-16 ENCOUNTER — Ambulatory Visit: Payer: Medicare PPO | Attending: Physical Medicine and Rehabilitation | Admitting: Physical Therapy

## 2019-03-16 DIAGNOSIS — R293 Abnormal posture: Secondary | ICD-10-CM | POA: Insufficient documentation

## 2019-03-16 DIAGNOSIS — M545 Low back pain, unspecified: Secondary | ICD-10-CM

## 2019-03-16 DIAGNOSIS — M6281 Muscle weakness (generalized): Secondary | ICD-10-CM

## 2019-03-16 NOTE — Therapy (Signed)
[facility] [address] [place], [place], [address] Phone: [phone]   Fax:  [phone]  Physical Therapy Treatment  Patient Details  Name: [name] MRN: [id number] Date of Birth: Nov 21, 1945 Referring Provider (PT): Magnus Sinning, MD   Encounter Date: 03/16/2019  PT End of Session - 03/16/19 0935    Visit Number  9    Date for PT Re-Evaluation  04/01/19    Authorization Type  [employer] Medicare    Authorization Time Period  02/16/19 to 04/01/19    PT Start Time  0930    PT Stop Time  1012    PT Time Calculation (min)  42 min    Activity Tolerance  Patient tolerated treatment well    Behavior During Therapy  [facility] for tasks assessed/performed       Past Medical History:  Diagnosis Date  . Allergy    RHINITIS  . ASHD (arteriosclerotic heart disease)   . Former smoker QUIT 08/09/2010  . Herpes labialis   . HH (hiatus hernia)   . HIV infection ([place])    POSITIVE  . Hypertriglyceridemia   . Psoriasis    ARTHRITIS  . Renal stones   . Tinnitus    CHRONIC    Past Surgical History:  Procedure Laterality Date  . CORONARY ARTERY BYPASS GRAFT  2001    There were no vitals filed for this visit.  Subjective Assessment - 03/16/19 0932    Subjective  Pt states that he "overdid it" yesterday walking up/down his stairs during the day. He has no pain currently and took medication this morning.    Pertinent History  psoriatic arthritis    Currently in Pain?  No/denies                       [facility] Adult PT Treatment/Exercise - 03/16/19 0001      Lumbar Exercises: Stretches   Other Lumbar Stretch Exercise  lumbar flexion with UE walkout on green physioball x15 reps forward, x10 reps side/side      Lumbar Exercises: Standing   Other Standing Lumbar Exercises  power tower: sidestepping each direction x5 trials #15    Other Standing Lumbar Exercises  single leg stance on foam  pad: toe tap side and back x5 reps each       Lumbar Exercises: Supine   Other Supine Lumbar Exercises  BLE hamstring curl isometric in red physioball 15x5 sec hold     Other Supine Lumbar Exercises  double knee to chest with overpressure 3x15 sec       Lumbar Exercises: Sidelying   Other Sidelying Lumbar Exercises  plank elbows/knees 5x5 sec hold each side      Lumbar Exercises: Prone   Other Prone Lumbar Exercises  straight leg hip extension 2x10 reps each (1 pillow underneath pelvis)              PT Education - 03/16/19 1141    Education Details  technique with therex    Person(s) Educated  Patient    Methods  Explanation;Verbal cues    Comprehension  Verbalized understanding;Returned demonstration       PT Short Term Goals - 03/02/19 1041      PT SHORT TERM GOAL #1   Title  Pt will demo consistency and independence with his initial HEP to increase flexibility and strength.    Time  3    Period  Weeks    Status  Achieved    Target Date  03/09/19      PT SHORT TERM GOAL #2   Title  Pt will report being able to return to his daily walking atleast 10 minutes at a time without the need for a rest break.    Time  3    Period  Weeks    Status  On-going        PT Long Term Goals - 03/02/19 1036      PT LONG TERM GOAL #1   Title  Pt will have 5/5 MMT strength of the LEs which will improve his efficiency with daily activity.    Time  6    Period  Weeks    Status  New      PT LONG TERM GOAL #2   Title  Pt will have improved lumbar flexibility evident by his ability to actively extend beyond neutral in standing, with or without discomfort.    Time  6    Period  Weeks    Status  New      PT LONG TERM GOAL #3   Title  Pt will have improved trunk strength and endurance, evident by his ability to maintain upright posture to neutral during his entire session.    Baseline  approximately 15 minutes    Time  6    Period  Weeks    Status  Partially Met      PT LONG  TERM GOAL #4   Title  Pt will be able to complete atleast 30 minutes of shopping without increase in his low back pain once returning home.    Time  6    Period  Weeks    Status  New      PT LONG TERM GOAL #5   Title  Pt will report atleast 50% improvement in his pain and stiffness from the start of PT which will allow him to complete daily activity around his home with less limitation.    Time  6    Period  Weeks    Status  New            Plan - 03/16/19 1142    Clinical Impression Statement  Today's session focused on therex to promote LE strength and endurance. Pt required PT tactile/verbal cuing to increase understanding of proper technique with several new exercises completed today. Pt's symptoms throughout the day are improving, and he is now noticing more centralized pain with prolonged standing. Pt had difficulty with single leg activity, requiring PT close supervision to prevent LOB. He reported adequate LE fatigue end of session without increase in pain. Will continue with current POC.    Personal Factors and Comorbidities  Age;Fitness    Examination-Activity Limitations  Stand    Examination-Participation Restrictions  Shop;Yard Work    Stability/Clinical Decision Making  Stable/Uncomplicated    Rehab Potential  Good    PT Frequency  2x / week    PT Duration  6 weeks    PT Treatment/Interventions  ADLs/Self Care Home Management;Aquatic Therapy;Moist Heat;Cryotherapy;Functional mobility training;Neuromuscular re-education;Therapeutic exercise;Therapeutic activities;Patient/family education;Manual techniques;Dry needling    PT Next Visit Plan  10th visit progress note; Functional hip strengthening, standing trunk stabilization/endurance    PT Home Exercise Plan  [email or web address]    Consulted and Agree with Plan of Care  Patient       Patient will benefit from skilled therapeutic intervention in order to improve the following deficits and impairments:  Decreased endurance,  Hypomobility,  Increased muscle spasms, Improper body mechanics, Decreased range of motion, Decreased activity tolerance, Decreased strength, Impaired flexibility, Postural dysfunction, Pain  Visit Diagnosis: Low back pain, unspecified back pain laterality, unspecified chronicity, unspecified whether sciatica present  Abnormal posture  Muscle weakness (generalized)     Problem List Patient Active Problem List   Diagnosis Date Noted  . Atherosclerosis of aorta ([place]) 11/30/2018  . Encounter for long-term (current) use of high-risk medication 03/18/2018  . Drug-induced erectile dysfunction 09/25/2015  . HIV positive ([place]) 08/21/2010  . Hyperlipidemia 08/21/2010  . Psoriatic arthritis ([place]) 08/21/2010  . ASHD (arteriosclerotic heart disease) 08/21/2010  . Herpes labialis 08/21/2010  . Allergic rhinitis due to pollen 08/21/2010  . GERD (gastroesophageal reflux disease) 08/21/2010  . Kidney stones 08/21/2010    11:47 AM,03/16/19 Sherol Dade PT, DPT [facility] [address] [place], [place], [address] Phone: [phone]   Fax:  [phone]  Name: [name] MRN: [id number] Date of Birth: February 07, 1946

## 2019-03-18 ENCOUNTER — Telehealth: Payer: Self-pay

## 2019-03-18 ENCOUNTER — Other Ambulatory Visit: Payer: Self-pay

## 2019-03-18 ENCOUNTER — Ambulatory Visit: Payer: Self-pay | Admitting: Family Medicine

## 2019-03-18 DIAGNOSIS — E785 Hyperlipidemia, unspecified: Secondary | ICD-10-CM

## 2019-03-18 DIAGNOSIS — I10 Essential (primary) hypertension: Secondary | ICD-10-CM

## 2019-03-18 MED ORDER — LOSARTAN POTASSIUM-HCTZ 50-12.5 MG PO TABS: 1.0000 | ORAL_TABLET | Freq: Every day | ORAL | 3 refills | Status: DC

## 2019-03-18 MED ORDER — ROSUVASTATIN CALCIUM 20 MG PO TABS: 20.0000 mg | ORAL_TABLET | Freq: Every day | ORAL | 3 refills | Status: DC

## 2019-03-18 NOTE — Telephone Encounter (Signed)
Received fax from [facility] order pharmacy pt. Needs a refill on his Rosuvastatin 20 mg and losartan/hctz 12.5 mg pt. Last apt. Was 11/29/18.

## 2019-03-18 NOTE — Telephone Encounter (Signed)
Done KH 

## 2019-03-21 ENCOUNTER — Other Ambulatory Visit: Payer: Self-pay

## 2019-03-21 ENCOUNTER — Encounter: Payer: Self-pay | Admitting: Physical Therapy

## 2019-03-21 ENCOUNTER — Ambulatory Visit: Payer: Medicare PPO | Admitting: Physical Therapy

## 2019-03-21 ENCOUNTER — Other Ambulatory Visit: Payer: Self-pay | Admitting: Family Medicine

## 2019-03-21 DIAGNOSIS — M6281 Muscle weakness (generalized): Secondary | ICD-10-CM | POA: Diagnosis not present

## 2019-03-21 DIAGNOSIS — R293 Abnormal posture: Secondary | ICD-10-CM

## 2019-03-21 DIAGNOSIS — M545 Low back pain, unspecified: Secondary | ICD-10-CM

## 2019-03-21 DIAGNOSIS — Z21 Asymptomatic human immunodeficiency virus [HIV] infection status: Secondary | ICD-10-CM

## 2019-03-21 MED ORDER — BIKTARVY 50-200-25 MG PO TABS: 1.0000 | ORAL_TABLET | Freq: Every day | ORAL | 3 refills | Status: DC

## 2019-03-21 NOTE — Therapy (Addendum)
[facility] [address] [place], [place], [address] Phone: [phone]   Fax:  [phone]  Physical Therapy Treatment/Progress note  Patient Details  Name: Rodrickus Min MRN: [id number] Date of Birth: [birth year] Referring Provider (PT): Magnus Sinning, MD  Progress Note Reporting Period 02/16/19 to 03/21/19  See note below for Objective Data and Assessment of Progress/Goals.      Encounter Date: 03/21/2019  PT End of Session - 03/21/19 0928    Visit Number  10    Date for PT Re-Evaluation  04/01/19    Authorization Type  [employer] Medicare    Authorization Time Period  02/16/19 to 04/01/19    PT Start Time  0928    PT Stop Time  1012    PT Time Calculation (min)  44 min    Activity Tolerance  Patient tolerated treatment well    Behavior During Therapy  [facility] for tasks assessed/performed       Past Medical History:  Diagnosis Date  . Allergy    RHINITIS  . ASHD (arteriosclerotic heart disease)   . Former smoker QUIT 08/09/2010  . Herpes labialis   . HH (hiatus hernia)   . HIV infection ([place])    POSITIVE  . Hypertriglyceridemia   . Psoriasis    ARTHRITIS  . Renal stones   . Tinnitus    CHRONIC    Past Surgical History:  Procedure Laterality Date  . CORONARY ARTERY BYPASS GRAFT  2001    There were no vitals filed for this visit.  Subjective Assessment - 03/21/19 0930    Subjective  I stretch every morning. I can stand longer and just overlall less tingling in my legs.    Pertinent History  psoriatic arthritis    Limitations  House hold activities    Currently in Pain?  No/denies    Multiple Pain Sites  No         [facility] PT Assessment - 03/21/19 0001      Strength   Overall Strength Comments  Hip abduction MMT Rt 4/5, Lt 5/5: Hip extension RT 4/5, Lt 4/5 RT hip extension AROM was less than the LT       Flexibility   Hamstrings  LT 100 degrees/slight knee bend: RT 80  degrees with slight knee bend of both knees    Quadriceps  Lt 100 deg in prone                   [facility] Adult PT Treatment/Exercise - 03/21/19 0001      Lumbar Exercises: Stretches   Active Hamstring Stretch  Right;Left;3 reps;30 seconds    Other Lumbar Stretch Exercise  lumbar flexion with UE walkout on green physioball x15 reps forward, x10 reps side/side      Lumbar Exercises: Standing   Other Standing Lumbar Exercises  power tower: sidestepping each direction x8 trials #15   VC for technique, more core contraction              PT Short Term Goals - 03/21/19 0934      PT SHORT TERM GOAL #2   Title  Pt will report being able to return to his daily walking atleast 10 minutes at a time without the need for a rest break.    Time  3    Period  Weeks    Status  On-going   Has not tried d/t weather       PT Long Term  Goals - 03/21/19 0936      PT LONG TERM GOAL #3   Title  Pt will have improved trunk strength and endurance, evident by his ability to maintain upright posture to neutral during his entire session.    Time  6    Period  Weeks    Status  Partially Met   Needs verbal cuing about 20 min into treatment     PT LONG TERM GOAL #4   Title  Pt will be able to complete atleast 30 minutes of shopping without increase in his low back pain once returning home.    Time  6    Period  Weeks    Status  Achieved      PT LONG TERM GOAL #5   Title  Pt will report atleast 50% improvement in his pain and stiffness from the start of PT which will allow him to complete daily activity around his home with less limitation.    Time  6    Period  Weeks    Status  Achieved   50% on a bad day. 80% on a good day           Plan - 03/21/19 7858    Clinical Impression Statement  Pt reports being able to stand straighter and straighter longer. His overall improvement since eval varies 50% at the low end to 80% on teh high end. This goal is met. Hamstring flexibility  improved greatly since eval. Pt has not done any walking for fitness yet due to "the weather has been a mess." He does notice he can grocery shop longer with less discomfort/pain/. He contiues to struggle endurance wise with his standing trunk/core strength. He is compliant with all his HEP. Nothing added as what he has currently is still quite challenging. Lt hip tested stronger in abduction, not so for RT hip. Same for hip extension.    Personal Factors and Comorbidities  Age;Fitness    Examination-Activity Limitations  Stand    Examination-Participation Restrictions  Shop;Yard Work    [employer]  Stable/Uncomplicated    PT Frequency  2x / week    PT Duration  6 weeks    PT Treatment/Interventions  ADLs/Self Care Home Management;Aquatic Therapy;Moist Heat;Cryotherapy;Functional mobility training;Neuromuscular re-education;Therapeutic exercise;Therapeutic activities;Patient/family education;Manual techniques;Dry needling    PT Next Visit Plan  Functional hip strengthening, standing trunk stabilization/endurance    PT Home Exercise Plan  [id number]    Consulted and Agree with Plan of Care  Patient       Patient will benefit from skilled therapeutic intervention in order to improve the following deficits and impairments:  Decreased endurance, Hypomobility, Increased muscle spasms, Improper body mechanics, Decreased range of motion, Decreased activity tolerance, Decreased strength, Impaired flexibility, Postural dysfunction, Pain  Visit Diagnosis: Low back pain, unspecified back pain laterality, unspecified chronicity, unspecified whether sciatica present  Abnormal posture  Muscle weakness (generalized)     Problem List Patient Active Problem List   Diagnosis Date Noted  . Atherosclerosis of aorta ([place]) 11/30/2018  . Encounter for long-term (current) use of high-risk medication 03/18/2018  . Drug-induced erectile dysfunction 09/25/2015  . HIV positive ([place])  08/21/2010  . Hyperlipidemia 08/21/2010  . Psoriatic arthritis ([place]) 08/21/2010  . ASHD (arteriosclerotic heart disease) 08/21/2010  . Herpes labialis 08/21/2010  . Allergic rhinitis due to pollen 08/21/2010  . GERD (gastroesophageal reflux disease) 08/21/2010  . Kidney stones 08/21/2010    COCHRAN,JENNIFER, PTA  03/21/2019, 10:15 AM  [facility] [address] [place], [place], [address] Phone: [phone]   Fax:  [phone]  Name: Garren Greenman MRN: [id number] Date of Birth: March 20, 1945  Addendum to include 10th visit progress note information. Pt is doing well and progressing towards his long term goals. Pt is able to self correct his standing posture, but he is limited to no more than 20 minutes at this time. His LE symptoms have greatly improved, as noted above. He would continue to benefit from skilled PT to address remaining deficits in flexibility and strength with eventual discharge to an advanced HEP.  10:17 AM,03/29/19 Sherol Dade PT, [place] [facility] [place]

## 2019-03-22 ENCOUNTER — Other Ambulatory Visit: Payer: Self-pay

## 2019-03-22 ENCOUNTER — Ambulatory Visit: Payer: Medicare PPO | Admitting: Family Medicine

## 2019-03-22 ENCOUNTER — Encounter: Payer: Self-pay | Admitting: Family Medicine

## 2019-03-22 VITALS — BP 110/68 | HR 81 | Temp 96.8°F | Wt 140.4 lb

## 2019-03-22 DIAGNOSIS — Z21 Asymptomatic human immunodeficiency virus [HIV] infection status: Secondary | ICD-10-CM

## 2019-03-22 DIAGNOSIS — I2510 Atherosclerotic heart disease of native coronary artery without angina pectoris: Secondary | ICD-10-CM

## 2019-03-22 DIAGNOSIS — L4050 Arthropathic psoriasis, unspecified: Secondary | ICD-10-CM

## 2019-03-22 DIAGNOSIS — Z79899 Other long term (current) drug therapy: Secondary | ICD-10-CM | POA: Diagnosis not present

## 2019-03-22 DIAGNOSIS — E785 Hyperlipidemia, unspecified: Secondary | ICD-10-CM | POA: Diagnosis not present

## 2019-03-22 DIAGNOSIS — M545 Low back pain, unspecified: Secondary | ICD-10-CM

## 2019-03-22 DIAGNOSIS — G8929 Other chronic pain: Secondary | ICD-10-CM

## 2019-03-22 NOTE — Progress Notes (Signed)
   Subjective:    Patient ID: [name], male    DOB: [birth year], 74 y.o.   MRN: [id number]  HPI He is here for an interval evaluation.  He has had difficulty recently with low back pain and has had 4 epidural injections.  He is also doing physical therapy for this and does feel much better.  He is using meloxicam on an as-needed basis.  He continues on his HIV medication and is having no difficulty with that.  No fever, chills, weight change, nausea or vomiting.  He has not had any chest related symptoms and does follow-up regularly with cardiology.  He continues to suffer with psoriatic arthritis and does have characteristic changes especially in his hands.  He continues on Crestor and is having no difficulty with that medication.   Review of Systems     Objective:   Physical Exam Alert and in no distress. Tympanic membranes and canals are normal. Pharyngeal area is normal. Neck is supple without adenopathy or thyromegaly. Cardiac exam shows a regular sinus rhythm without murmurs or gallops. Lungs are clear to auscultation.  Ulnar deviation is noted on the digits of his hands.  Abdominal exam shows no hepatosplenomegaly, masses or tenderness.       Assessment & Plan:  ASHD (arteriosclerotic heart disease) - Plan: CBC with Differential, Comprehensive metabolic panel, Lipid panel  HIV positive (HCC) - Plan: CBC with Differential, Comprehensive metabolic panel, HIV-1 RNA quant-no reflex-bld, T-helper cells (CD4) count  Hyperlipidemia, unspecified hyperlipidemia type - Plan: Lipid panel  Psoriatic arthritis ([place])  Encounter for long-term (current) use of high-risk medication - Plan: CBC with Differential, Comprehensive metabolic panel, HIV-1 RNA quant-no reflex-bld, Lipid panel, T-helper cells (CD4) count  Chronic midline low back pain without sciatica He will continue on his present medications.  Encouraged to continue with physical therapy and general rehab for his back.

## 2019-03-23 ENCOUNTER — Encounter: Payer: Self-pay | Admitting: Physical Therapy

## 2019-03-23 ENCOUNTER — Other Ambulatory Visit: Payer: Self-pay

## 2019-03-23 ENCOUNTER — Ambulatory Visit: Payer: Medicare PPO | Admitting: Physical Therapy

## 2019-03-23 DIAGNOSIS — M545 Low back pain, unspecified: Secondary | ICD-10-CM

## 2019-03-23 DIAGNOSIS — R293 Abnormal posture: Secondary | ICD-10-CM

## 2019-03-23 DIAGNOSIS — M6281 Muscle weakness (generalized): Secondary | ICD-10-CM

## 2019-03-23 LAB — T-HELPER CELLS (CD4) COUNT (NOT AT ARMC)
% CD 4 Pos. Lymph.: 38.9 % (ref 30.8–58.5)
Absolute CD 4 Helper: 506 /uL (ref 359–1519)
Basophils Absolute: 0 10*3/uL (ref 0.0–0.2)
Basos: 0 %
EOS (ABSOLUTE): 0.1 10*3/uL (ref 0.0–0.4)
Eos: 1 %
Hematocrit: 47.3 % (ref 37.5–51.0)
Hemoglobin: 16.3 g/dL (ref 13.0–17.7)
Immature Grans (Abs): 0 10*3/uL (ref 0.0–0.1)
Immature Granulocytes: 0 %
Lymphocytes Absolute: 1.3 10*3/uL (ref 0.7–3.1)
Lymphs: 18 %
MCH: 32.4 pg (ref 26.6–33.0)
MCHC: 34.5 g/dL (ref 31.5–35.7)
MCV: 94 fL (ref 79–97)
Monocytes Absolute: 0.3 10*3/uL (ref 0.1–0.9)
Monocytes: 5 %
Neutrophils Absolute: 5.2 10*3/uL (ref 1.4–7.0)
Neutrophils: 76 %
Platelets: 166 10*3/uL (ref 150–450)
RBC: 5.03 x10E6/uL (ref 4.14–5.80)
RDW: 12.1 % (ref 11.6–15.4)
WBC: 6.9 10*3/uL (ref 3.4–10.8)

## 2019-03-23 LAB — COMPREHENSIVE METABOLIC PANEL
ALT: 17 IU/L (ref 0–44)
AST: 18 IU/L (ref 0–40)
Albumin/Globulin Ratio: 2 (ref 1.2–2.2)
Albumin: 4.7 g/dL (ref 3.7–4.7)
Alkaline Phosphatase: 71 IU/L (ref 39–117)
BUN/Creatinine Ratio: 12 (ref 10–24)
BUN: 18 mg/dL (ref 8–27)
Bilirubin Total: 0.5 mg/dL (ref 0.0–1.2)
CO2: 23 mmol/L (ref 20–29)
Calcium: 10 mg/dL (ref 8.6–10.2)
Chloride: 101 mmol/L (ref 96–106)
Creatinine, Ser: 1.49 mg/dL — ABNORMAL HIGH (ref 0.76–1.27)
GFR calc Af Amer: 53 mL/min/{1.73_m2} — ABNORMAL LOW (ref >59)
GFR calc non Af Amer: 46 mL/min/{1.73_m2} — ABNORMAL LOW (ref >59)
Globulin, Total: 2.3 g/dL (ref 1.5–4.5)
Glucose: 100 mg/dL — ABNORMAL HIGH (ref 65–99)
Potassium: 4.3 mmol/L (ref 3.5–5.2)
Sodium: 139 mmol/L (ref 134–144)
Total Protein: 7 g/dL (ref 6.0–8.5)

## 2019-03-23 LAB — HIV-1 RNA QUANT-NO REFLEX-BLD: HIV-1 RNA Viral Load: <20 copies/mL

## 2019-03-23 LAB — LIPID PANEL
Chol/HDL Ratio: 4 ratio (ref 0.0–5.0)
Cholesterol, Total: 120 mg/dL (ref 100–199)
HDL: 30 mg/dL — ABNORMAL LOW (ref >39)
LDL Chol Calc (NIH): 55 mg/dL (ref 0–99)
Triglycerides: 214 mg/dL — ABNORMAL HIGH (ref 0–149)
VLDL Cholesterol Cal: 35 mg/dL (ref 5–40)

## 2019-03-23 NOTE — Therapy (Signed)
[facility] [address] [name], [place], [address] Phone: [phone]   Fax:  [phone]  Physical Therapy Treatment  Patient Details  Name: [name] MRN: [id number] Date of Birth: 20-Oct-1945 Referring Provider (PT): Magnus Sinning, MD   Encounter Date: 03/23/2019  PT End of Session - 03/23/19 0845    Visit Number  11    Date for PT Re-Evaluation  04/01/19    Authorization Type  [employer] Medicare    Authorization Time Period  02/16/19 to 04/01/19    PT Start Time  0845    PT Stop Time  0925    PT Time Calculation (min)  40 min    Activity Tolerance  Patient tolerated treatment well    Behavior During Therapy  [facility] for tasks assessed/performed       Past Medical History:  Diagnosis Date  . Allergy    RHINITIS  . ASHD (arteriosclerotic heart disease)   . Former smoker QUIT 08/09/2010  . Herpes labialis   . HH (hiatus hernia)   . HIV infection ([place])    POSITIVE  . Hypertriglyceridemia   . Psoriasis    ARTHRITIS  . Renal stones   . Tinnitus    CHRONIC    Past Surgical History:  Procedure Laterality Date  . CORONARY ARTERY BYPASS GRAFT  2001    There were no vitals filed for this visit.  Subjective Assessment - 03/23/19 0848    Subjective  I walked 1/2 mile yesterdayand it felt good. My legs actually felt looser.    Pertinent History  psoriatic arthritis    Currently in Pain?  No/denies    Aggravating Factors   Standing too long    Pain Relieving Factors  stretching, maybe walking    Multiple Pain Sites  No                       [facility] Adult PT Treatment/Exercise - 03/23/19 0001      Lumbar Exercises: Stretches   Other Lumbar Stretch Exercise  Cnae overhead for lateral trunk flexion 5x bil  VC to keep hip down and be longer vs shorter      Lumbar Exercises: Aerobic   Elliptical  L3 R 3 1 min 30 sec      Lumbar Exercises: Standing   Other Standing  Lumbar Exercises  power tower: sidestepping each direction x10  trials #15   VC for technique, more core contraction     Lumbar Exercises: Supine   Bridge  --   LE on ball 10x 3 sec hold   Bridge Limitations  Added leg lift 5x bil    will add to HEP     Lumbar Exercises: Prone   Straight Leg Raise  10 reps;2 seconds   added 1# weights, VC to extend knee more     Manual Therapy   Soft tissue mobilization  Addaday assit to Rt gluteals in sidelying               PT Short Term Goals - 03/23/19 0855      PT SHORT TERM GOAL #2   Title  Pt will report being able to return to his daily walking atleast 10 minutes at a time without the need for a rest break.    Time  3    Period  Weeks    Status  Achieved        PT Long Term Goals -  03/21/19 0936      PT LONG TERM GOAL #3   Title  Pt will have improved trunk strength and endurance, evident by his ability to maintain upright posture to neutral during his entire session.    Time  6    Period  Weeks    Status  Partially Met   Needs verbal cuing about 20 min into treatment     PT LONG TERM GOAL #4   Title  Pt will be able to complete atleast 30 minutes of shopping without increase in his low back pain once returning home.    Time  6    Period  Weeks    Status  Achieved      PT LONG TERM GOAL #5   Title  Pt will report atleast 50% improvement in his pain and stiffness from the start of PT which will allow him to complete daily activity around his home with less limitation.    Time  6    Period  Weeks    Status  Achieved   50% on a bad day. 80% on a good day           Plan - 03/23/19 4621    Clinical Impression Statement  Pt was able to walk 1/2 mile yesterday with no adverse effects. He reports his hamstrings felt looser after walking. All short term goals are now met. Session focused mainly on hip extension strength with core stabilization in a variety of positions. Pt was fatigued post session.    Personal  Factors and Comorbidities  Age;Fitness    Examination-Activity Limitations  Stand    Examination-Participation Restrictions  Shop;Yard Work    Stability/Clinical Decision Making  Stable/Uncomplicated    Rehab Potential  Good    PT Frequency  2x / week    PT Duration  6 weeks    PT Treatment/Interventions  ADLs/Self Care Home Management;Aquatic Therapy;Moist Heat;Cryotherapy;Functional mobility training;Neuromuscular re-education;Therapeutic exercise;Therapeutic activities;Patient/family education;Manual techniques;Dry needling    PT Home Exercise Plan  [id number]    Consulted and Agree with Plan of Care  Patient       Patient will benefit from skilled therapeutic intervention in order to improve the following deficits and impairments:  Decreased endurance, Hypomobility, Increased muscle spasms, Improper body mechanics, Decreased range of motion, Decreased activity tolerance, Decreased strength, Impaired flexibility, Postural dysfunction, Pain  Visit Diagnosis: Low back pain, unspecified back pain laterality, unspecified chronicity, unspecified whether sciatica present  Abnormal posture  Muscle weakness (generalized)     Problem List Patient Active Problem List   Diagnosis Date Noted  . Atherosclerosis of aorta ([place]) 11/30/2018  . Encounter for long-term (current) use of high-risk medication 03/18/2018  . Drug-induced erectile dysfunction 09/25/2015  . HIV positive ([place]) 08/21/2010  . Hyperlipidemia 08/21/2010  . Psoriatic arthritis ([place]) 08/21/2010  . ASHD (arteriosclerotic heart disease) 08/21/2010  . Herpes labialis 08/21/2010  . Allergic rhinitis due to pollen 08/21/2010  . GERD (gastroesophageal reflux disease) 08/21/2010  . Kidney stones 08/21/2010    Adeja Sarratt, PTA 03/23/2019, 9:24 AM  [facility] [address] [place], [place], [address] Phone: [phone]   Fax:  [phone]  Name: Yamin Swingler MRN: [id number] Date of Birth: 1945-05-01

## 2019-03-25 ENCOUNTER — Other Ambulatory Visit: Payer: Self-pay | Admitting: Physical Medicine and Rehabilitation

## 2019-03-25 NOTE — Telephone Encounter (Signed)
Called patient to advise that any refills need to come from PCP. Patient states that he understands.

## 2019-03-25 NOTE — Telephone Encounter (Signed)
Yes, if PCP is ok with him on medication he may continue to take. I don't mid refills but need to see if ok long term - affects kidneys and may be exacerbated with other meds.

## 2019-03-28 ENCOUNTER — Ambulatory Visit: Payer: Medicare PPO | Admitting: Physical Therapy

## 2019-03-28 ENCOUNTER — Encounter: Payer: Self-pay | Admitting: Physical Therapy

## 2019-03-28 ENCOUNTER — Other Ambulatory Visit: Payer: Self-pay

## 2019-03-28 DIAGNOSIS — M545 Low back pain, unspecified: Secondary | ICD-10-CM

## 2019-03-28 DIAGNOSIS — M6281 Muscle weakness (generalized): Secondary | ICD-10-CM

## 2019-03-28 DIAGNOSIS — R293 Abnormal posture: Secondary | ICD-10-CM | POA: Diagnosis not present

## 2019-03-28 NOTE — Therapy (Signed)
[facility] [address] [place], [place], [address] Phone: [phone]   Fax:  [phone]  Physical Therapy Treatment  Patient Details  Name: [name] MRN: [id number] Date of Birth: 08/01/1945 Referring Provider (PT): Gregory Newton, MD   Encounter Date: 03/28/2019  PT End of Session - 03/28/19 0850    Visit Number  12    Date for PT Re-Evaluation  04/01/19    Authorization Type  [employer] Medicare    Authorization Time Period  02/16/19 to 04/01/19    PT Start Time  0845    PT Stop Time  0925    PT Time Calculation (min)  40 min    Activity Tolerance  Patient tolerated treatment well    Behavior During Therapy  [facility] for tasks assessed/performed       Past Medical History:  Diagnosis Date  . Allergy    RHINITIS  . ASHD (arteriosclerotic heart disease)   . Former smoker QUIT 08/09/2010  . Herpes labialis   . HH (hiatus hernia)   . HIV infection ([facility])    POSITIVE  . Hypertriglyceridemia   . Psoriasis    ARTHRITIS  . Renal stones   . Tinnitus    CHRONIC    Past Surgical History:  Procedure Laterality Date  . CORONARY ARTERY BYPASS GRAFT  2001    There were no vitals filed for this visit.  Subjective Assessment - 03/28/19 0852    Subjective  I walked just over 6 miles this weekend and boy did Ii hurt after. My low back really hurt, but eventually it got better. Today my pain is about a 5/10.    Pertinent History  psoriatic arthritis    Currently in Pain?  Yes    Pain Score  5     Pain Location  Back    Pain Orientation  Lower    Pain Descriptors / Indicators  Sore    Aggravating Factors   Think I walked too long    Pain Relieving Factors  Stretching    Multiple Pain Sites  No                       [facility] Adult PT Treatment/Exercise - 03/28/19 0001      Lumbar Exercises: Stretches   Single Knee to Chest Stretch  --   Knee to opposite shoulder stretch 3x 20  sec bil   Lower Trunk Rotation  2 reps;20 seconds    Hip Flexor Stretch  Right;2 reps;30 seconds      Lumbar Exercises: Aerobic   Nustep  L1 x 10 min while we discussed current status      Lumbar Exercises: Supine   Bridge  --   5x 5 sec hold   Bridge Limitations  8x bil hold 3 sec      Lumbar Exercises: Sidelying   Hip Abduction  Right;Left;10 reps    Hip Abduction Weights (lbs)  added yellow band for resistance for home      Lumbar Exercises: Prone   Straight Leg Raise  10 reps;2 seconds   added 2# weights, VC to extend knee more              PT Short Term Goals - 03/23/19 0855      PT SHORT TERM GOAL #2   Title  Pt will report being able to return to his daily walking atleast 10 minutes at a time without the   need for a rest break.    Time  3    Period  Weeks    Status  Achieved        PT Long Term Goals - 03/21/19 0936      PT LONG TERM GOAL #3   Title  Pt will have improved trunk strength and endurance, evident by his ability to maintain upright posture to neutral during his entire session.    Time  6    Period  Weeks    Status  Partially Met   Needs verbal cuing about 20 min into treatment     PT LONG TERM GOAL #4   Title  Pt will be able to complete atleast 30 minutes of shopping without increase in his low back pain once returning home.    Time  6    Period  Weeks    Status  Achieved      PT LONG TERM GOAL #5   Title  Pt will report atleast 50% improvement in his pain and stiffness from the start of PT which will allow him to complete daily activity around his home with less limitation.    Time  6    Period  Weeks    Status  Achieved   50% on a bad day. 80% on a good day           Plan - 03/28/19 0851    Clinical Impression Statement  Pt significantly increased his walking over the weekend going form 1/2 mile to 6 miles...Marland Kitchenhe had increased low back pain after this.We discussed increasing his walking distance/time with a more gradual  approach. He verbally agreed. The pain did improve with rest, medication, and stetching. Today we added yellow tband to sidelying hip abduction and gave pt band to take home to increase resistance.    Personal Factors and Comorbidities  Age;Fitness    Examination-Activity Limitations  Stand    Examination-Participation Restrictions  Shop;Yard Work    Stability/Clinical Decision Making  Stable/Uncomplicated    Rehab Potential  Good    PT Frequency  2x / week    PT Duration  6 weeks    PT Treatment/Interventions  ADLs/Self Care Home Management;Aquatic Therapy;Moist Heat;Cryotherapy;Functional mobility training;Neuromuscular re-education;Therapeutic exercise;Therapeutic activities;Patient/family education;Manual techniques;Dry needling    PT Next Visit Plan  Re-assess next visit    PT Home Exercise Plan  [id number]    Consulted and Agree with Plan of Care  Patient       Patient will benefit from skilled therapeutic intervention in order to improve the following deficits and impairments:  Decreased endurance, Hypomobility, Increased muscle spasms, Improper body mechanics, Decreased range of motion, Decreased activity tolerance, Decreased strength, Impaired flexibility, Postural dysfunction, Pain  Visit Diagnosis: Low back pain, unspecified back pain laterality, unspecified chronicity, unspecified whether sciatica present  Abnormal posture  Muscle weakness (generalized)     Problem List Patient Active Problem List   Diagnosis Date Noted  . Atherosclerosis of aorta ([place]) 11/30/2018  . Encounter for long-term (current) use of high-risk medication 03/18/2018  . Drug-induced erectile dysfunction 09/25/2015  . HIV positive ([place]) 08/21/2010  . Hyperlipidemia 08/21/2010  . Psoriatic arthritis ([place]) 08/21/2010  . ASHD (arteriosclerotic heart disease) 08/21/2010  . Herpes labialis 08/21/2010  . Allergic rhinitis due to pollen 08/21/2010  . GERD (gastroesophageal reflux disease) 08/21/2010  .  Kidney stones 08/21/2010    [name], PTA 03/28/2019, 11:02 AM  [facility] [address], [address] [place],  [place], [address] Phone: [phone]   Fax:  [phone]  Name: [name] MRN: [id number] Date of Birth: [birth year]

## 2019-03-30 ENCOUNTER — Ambulatory Visit: Payer: Medicare PPO | Admitting: Physical Therapy

## 2019-03-30 ENCOUNTER — Encounter: Payer: Self-pay | Admitting: Physical Therapy

## 2019-03-30 ENCOUNTER — Other Ambulatory Visit: Payer: Self-pay

## 2019-03-30 DIAGNOSIS — M545 Low back pain, unspecified: Secondary | ICD-10-CM

## 2019-03-30 DIAGNOSIS — R293 Abnormal posture: Secondary | ICD-10-CM | POA: Diagnosis not present

## 2019-03-30 DIAGNOSIS — M6281 Muscle weakness (generalized): Secondary | ICD-10-CM

## 2019-03-30 NOTE — Therapy (Signed)
[facility] [address], [place], [place], [address] Phone: [phone]   Fax:  [phone]  Physical Therapy Treatment/re-evaluation  Patient Details  Name: [name] MRN: [id number] Date of Birth: 1945/04/10 Referring Provider (PT): Magnus Sinning, MD   Encounter Date: 03/30/2019  PT End of Session - 03/30/19 0932    Visit Number  13    Date for PT Re-Evaluation  04/27/19    Authorization Type  [employer]    Authorization Time Period  04/02/19 to 05/03/19    Authorization - Visit Number  4    Authorization - Number of Visits  8    PT Start Time  0930    PT Stop Time  1015    PT Time Calculation (min)  45 min    Activity Tolerance  Patient tolerated treatment well;No increased pain    Behavior During Therapy  WFL for tasks assessed/performed       Past Medical History:  Diagnosis Date  . Allergy    RHINITIS  . ASHD (arteriosclerotic heart disease)   . Former smoker QUIT 08/09/2010  . Herpes labialis   . HH (hiatus hernia)   . HIV infection ([place])    POSITIVE  . Hypertriglyceridemia   . Psoriasis    ARTHRITIS  . Renal stones   . Tinnitus    CHRONIC    Past Surgical History:  Procedure Laterality Date  . CORONARY ARTERY BYPASS GRAFT  2001    There were no vitals filed for this visit.  Subjective Assessment - 03/30/19 0935    Subjective  Pt states things are going well He is walking up to half a mile several days a week. He feels atleast 50% improved with the glute discomfort and Rt side stiffness being his biggest complaints.    Pertinent History  psoriatic arthritis    How long can you walk comfortably?  up to .5 miles    Currently in Pain?  No/denies         [facility] PT Assessment - 03/30/19 0001      Assessment   Medical Diagnosis  lumbar spondylosis     Referring Provider (PT)  Magnus Sinning, MD    Onset Date/Surgical Date  --   February 2020   Next MD Visit  after PT    Prior Therapy  none       Precautions   Precautions  None      Balance Screen   Has the patient fallen in the past 6 months  No    Has the patient had a decrease in activity level because of a fear of falling?   No    Is the patient reluctant to leave their home because of a fear of falling?   No      Prior Function   Leisure  typically walking 20 minutes a day       Cognition   Overall Cognitive Status  Within Functional Limits for tasks assessed      Observation/Other Assessments   Observations  pt standing flexed forward    Focus on Therapeutic Outcomes (FOTO)   --      Sensation   Additional Comments  denies numbness tingling at this time       AROM   Lumbar Flexion  WNL    Lumbar Extension  5 deg beyond neutral with discomfort noted end range in bilateral glutes    Lumbar - Right Rotation  limited 25%, pain  free    Lumbar - Left Rotation  limited 25%, pain free       Strength   Overall Strength Comments  hip abduction 5/5 MMT; Rt hip extension 4/5 MMT       Flexibility   Soft Tissue Assessment /Muscle Length  yes    Hamstrings  lacking 30 deg at popliteal angle BLE    Quadriceps  Rt: 120 deg, Lt: 115 deg in prone       Palpation   Spinal mobility  hypomobile lumbar and thoracic spine        Special Tests   Other special tests  (+) thomas test for ITB       Ambulation/Gait   Gait Comments  decreased hip extension noted bilaterally                    [facility] Adult PT Treatment/Exercise - 03/30/19 0001      Exercises   Exercises  Other Exercises    Other Exercises   PT verbally reviewed exercises/HEP with pt to ensure pt has understanding of the implications for each one; PT answered pt questions regarding exercise/walking for home             PT Education - 03/30/19 1235    Education Details  importance of avoiding over-doing activity; POC moving forward    Person(s) Educated  Patient    Methods  Explanation;Handout;Verbal cues     Comprehension  Verbalized understanding       PT Short Term Goals - 03/30/19 0953      PT SHORT TERM GOAL #1   Title  Pt will demo consistency and independence with his initial HEP to increase flexibility and strength.    Time  3    Period  Weeks    Status  Achieved    Target Date  03/09/19      PT SHORT TERM GOAL #2   Title  Pt will report being able to return to his daily walking atleast 10 minutes at a time without the need for a rest break.    Baseline  up to approximately 15-20 minutes    Time  3    Period  Weeks    Status  Achieved        PT Long Term Goals - 03/30/19 5638      PT LONG TERM GOAL #1   Title  Pt will have 5/5 MMT strength of the LEs which will improve his efficiency with daily activity.    Baseline  all except Rt hip extension 4/5 MMT    Time  4    Period  Weeks    Status  Partially Met    Target Date  04/29/19      PT LONG TERM GOAL #2   Title  Pt will have improved lumbar flexibility evident by his ability to actively extend beyond neutral in standing, with or without discomfort.    Time  6    Period  Weeks    Status  Achieved      PT LONG TERM GOAL #3   Title  Pt will have improved trunk strength and endurance, evident by his ability to maintain upright posture to neutral during his entire session.    Baseline  40 minutes    Time  6    Period  Weeks    Status  Achieved      PT LONG TERM GOAL #4   Title  Pt will be  able to complete atleast 30 minutes of walking without increase in his low back pain once returning home.    Time  4    Period  Weeks    Status  Revised    Target Date  04/29/19      PT LONG TERM GOAL #5   Title  Pt will report atleast 60% improvement in his pain and stiffness from the start of PT which will allow him to complete daily activity around his home with less limitation.    Baseline  50%    Time  6    Period  Weeks    Status  Revised      Additional Long Term Goals   Additional Long Term Goals  Yes      PT  LONG TERM GOAL #6   Title  Pt will be independent with his advanced HEP to allow for safe progression of exercise at home once dischargede from PT.    Time  4    Period  Weeks    Status  New            Plan - 03/30/19 1224    Clinical Impression Statement  Pt has been making steady progress towards his goals. He has met all of his short-term goals and nearly all of his long-term goals. Pt's hamstring and quadriceps flexibility has improved bilaterally, and his LE strength has increased. Pt's active lumbar ROM, primarily into extension, remains limited and painful. His trunk strength has increased evident by his ability to self-correct and maintain upright posture throughout his sessions, but his walking distance and household chores are limited due to lack of endurance in this musculature. Pt has been diligent with his HEP and would benefit from 3-4 more PT visits to allow the therapist to make necessary exercise adjustments and educate pt on safe activity progression for d/c and full return to prior level of function.    Personal Factors and Comorbidities  Age;Fitness    Examination-Activity Limitations  Stand    Examination-Participation Restrictions  Shop;Yard Work    Stability/Clinical Decision Making  Stable/Uncomplicated    Rehab Potential  Good    PT Frequency  1x / week    PT Duration  4 weeks    PT Treatment/Interventions  ADLs/Self Care Home Management;Aquatic Therapy;Moist Heat;Cryotherapy;Functional mobility training;Neuromuscular re-education;Therapeutic exercise;Therapeutic activities;Patient/family education;Manual techniques;Dry needling    PT Next Visit Plan  let pt know he has 3 visits approved by insurance and we will likely d/c after that; hip extensor strength progression; begin finalizing HEP    PT Home Exercise Plan  [email or web address]    Consulted and Agree with Plan of Care  Patient       Patient will benefit from skilled therapeutic intervention in order to improve the  following deficits and impairments:  Decreased endurance, Hypomobility, Increased muscle spasms, Improper body mechanics, Decreased range of motion, Decreased activity tolerance, Decreased strength, Impaired flexibility, Postural dysfunction, Pain  Visit Diagnosis: Low back pain, unspecified back pain laterality, unspecified chronicity, unspecified whether sciatica present  Abnormal posture  Muscle weakness (generalized)     Problem List Patient Active Problem List   Diagnosis Date Noted  . Atherosclerosis of aorta ([place]) 11/30/2018  . Encounter for long-term (current) use of high-risk medication 03/18/2018  . Drug-induced erectile dysfunction 09/25/2015  . HIV positive ([place]) 08/21/2010  . Hyperlipidemia 08/21/2010  . Psoriatic arthritis ([place]) 08/21/2010  . ASHD (arteriosclerotic heart disease) 08/21/2010  . Herpes labialis 08/21/2010  . Allergic rhinitis due  to pollen 08/21/2010  . GERD (gastroesophageal reflux disease) 08/21/2010  . Kidney stones 08/21/2010    12:36 PM,03/30/19 Sherol Dade PT, DPT [facility] [address] [place], [place], [address] Phone: [phone]   Fax:  [phone]  Name: [name] MRN: [id number] Date of Birth: [birth year]

## 2019-03-30 NOTE — Patient Instructions (Signed)
Access Code: [email or web address]  URL: [email or web address]  Date: 03/30/2019  Prepared by: Sherol Dade    Exercises Supine Lower Trunk Rotation- 10 reps- 3 sets- 3x daily- 7x weekly  Sidelying Thoracic and Shoulder Rotation- 10 reps- 3 sets- 3x daily- 7x weekly  Seated Thoracic Lumbar Extension- 10 reps- 3x daily- 7x weekly  Modified Thomas Stretch- 10 reps- 30 hold- 3x daily- 7x weekly  Standing Shoulder Extension with Resistance- 10 reps- 2 sets- 2x daily- 7x weekly  Seated Cat Camel- 10 reps- 3x daily- 7x weekly  Supine Bridge- 10 reps- 1 sets- 5 hold- 1x daily- 7x weekly  Sidelying Diagonal Hip Abduction- 10 reps- 1 sets- 1x daily- 7x weekly  Standing Hip Extension with Resistance at Ankles and Counter Support- 10 reps- 3 sets- 1x daily- 7x weekly    [facility] [address] [place], [place] [address] Phone # [phone] Fax [phone]

## 2019-04-06 ENCOUNTER — Ambulatory Visit: Payer: Medicare PPO | Admitting: Physical Therapy

## 2019-04-06 ENCOUNTER — Other Ambulatory Visit: Payer: Self-pay

## 2019-04-06 ENCOUNTER — Encounter: Payer: Self-pay | Admitting: Physical Therapy

## 2019-04-06 DIAGNOSIS — M6281 Muscle weakness (generalized): Secondary | ICD-10-CM | POA: Diagnosis not present

## 2019-04-06 DIAGNOSIS — M545 Low back pain, unspecified: Secondary | ICD-10-CM

## 2019-04-06 DIAGNOSIS — R293 Abnormal posture: Secondary | ICD-10-CM | POA: Diagnosis not present

## 2019-04-06 NOTE — Therapy (Signed)
[facility] [address] [place], [place], [address] Phone: [phone]   Fax:  [phone]  Physical Therapy Treatment  Patient Details  Name: [name] MRN: [id number] Date of Birth: [birth year] Referring Provider (PT): Magnus Sinning, MD   Encounter Date: 04/06/2019  PT End of Session - 04/06/19 0842    Visit Number  14    Date for PT Re-Evaluation  04/27/19    Authorization Type  [employer]    Authorization Time Period  04/02/19 to 05/03/19    Authorization - Visit Number  5    Authorization - Number of Visits  8    PT Start Time  0841    PT Stop Time  0920    PT Time Calculation (min)  39 min    Activity Tolerance  Patient tolerated treatment well    Behavior During Therapy  [facility] for tasks assessed/performed       Past Medical History:  Diagnosis Date  . Allergy    RHINITIS  . ASHD (arteriosclerotic heart disease)   . Former smoker QUIT 08/09/2010  . Herpes labialis   . HH (hiatus hernia)   . HIV infection ([place])    POSITIVE  . Hypertriglyceridemia   . Psoriasis    ARTHRITIS  . Renal stones   . Tinnitus    CHRONIC    Past Surgical History:  Procedure Laterality Date  . CORONARY ARTERY BYPASS GRAFT  2001    There were no vitals filed for this visit.                    [facility] PT Treatment/Exercise - 04/06/19 0001      Lumbar Exercises: Stretches   Active Hamstring Stretch  Right;Left;3 reps;10 seconds    Active Hamstring Stretch Limitations  VC for more thoracic ext    Other Lumbar Stretch Exercise  Seated Cat Camel 10x      Lumbar Exercises: Aerobic   Nustep  L2 x 5 min LE prep      Lumbar Exercises: Sidelying   Hip Abduction  Both;20 reps;Weights    Hip Abduction Weights (lbs)  yellow band      Lumbar Exercises: Prone   Straight Leg Raise  10 reps;2 seconds   added 2# weights, VC to extend knee more   Straight Leg Raises Limitations  Pt requested a  review     Other Prone Lumbar Exercises  Forearm plank 5 sec 2x   added to HEp for advancment              PT Short Term Goals - 03/30/19 0953      PT SHORT TERM GOAL #1   Title  Pt will demo consistency and independence with his initial HEP to increase flexibility and strength.    Time  3    Period  Weeks    Status  Achieved    Target Date  03/09/19      PT SHORT TERM GOAL #2   Title  Pt will report being able to return to his daily walking atleast 10 minutes at a time without the need for a rest break.    Baseline  up to approximately 15-20 minutes    Time  3    Period  Weeks    Status  Achieved        PT Long Term Goals - 03/30/19 0954      PT LONG TERM GOAL #1  Title  Pt will have 5/5 MMT strength of the LEs which will improve his efficiency with daily activity.    Baseline  all except Rt hip extension 4/5 MMT    Time  4    Period  Weeks    Status  Partially Met    Target Date  04/29/19      PT LONG TERM GOAL #2   Title  Pt will have improved lumbar flexibility evident by his ability to actively extend beyond neutral in standing, with or without discomfort.    Time  6    Period  Weeks    Status  Achieved      PT LONG TERM GOAL #3   Title  Pt will have improved trunk strength and endurance, evident by his ability to maintain upright posture to neutral during his entire session.    Baseline  40 minutes    Time  6    Period  Weeks    Status  Achieved      PT LONG TERM GOAL #4   Title  Pt will be able to complete atleast 30 minutes of walking without increase in his low back pain once returning home.    Time  4    Period  Weeks    Status  Revised    Target Date  04/29/19      PT LONG TERM GOAL #5   Title  Pt will report atleast 60% improvement in his pain and stiffness from the start of PT which will allow him to complete daily activity around his home with less limitation.    Baseline  50%    Time  6    Period  Weeks    Status  Revised       Additional Long Term Goals   Additional Long Term Goals  Yes      PT LONG TERM GOAL #6   Title  Pt will be independent with his advanced HEP to allow for safe progression of exercise at home once dischargede from PT.    Time  4    Period  Weeks    Status  New            Plan - 04/06/19 3235    Clinical Impression Statement  Pt reports having the best 5 days I have had in a long time. He is consistent with his HEP and walks and stands with the best posture he has demonstrated. He has not used his ankle weights for his hip strengthening at home yet.  PTA encouraged pt to get the weights out this week. Pt was able to demonstrate a forearm plank for 5-6 sec to advance his core strength at home. He was also showed how to modify by dropping down to his thighs. He denied any pain with the plak exercise, it was just "hard."    Personal Factors and Comorbidities  Age;Fitness    Examination-Activity Limitations  Stand    Examination-Participation Restrictions  Shop;Yard Work    Stability/Clinical Decision Making  Stable/Uncomplicated    Rehab Potential  Good    PT Frequency  1x / week    PT Duration  4 weeks    PT Treatment/Interventions  ADLs/Self Care Home Management;Aquatic Therapy;Moist Heat;Cryotherapy;Functional mobility training;Neuromuscular re-education;Therapeutic exercise;Therapeutic activities;Patient/family education;Manual techniques;Dry needling    PT Next Visit Plan  Pt has 2 more visits then DC.    PT Home Exercise Plan  [email or web address]    Consulted and Agree with Plan of  Care  Patient       Patient will benefit from skilled therapeutic intervention in order to improve the following deficits and impairments:  Decreased endurance, Hypomobility, Increased muscle spasms, Improper body mechanics, Decreased range of motion, Decreased activity tolerance, Decreased strength, Impaired flexibility, Postural dysfunction, Pain  Visit Diagnosis: Low back pain, unspecified back pain laterality,  unspecified chronicity, unspecified whether sciatica present  Abnormal posture  Muscle weakness (generalized)     Problem List Patient Active Problem List   Diagnosis Date Noted  . Atherosclerosis of aorta ([place]) 11/30/2018  . Encounter for long-term (current) use of high-risk medication 03/18/2018  . Drug-induced erectile dysfunction 09/25/2015  . HIV positive ([place]) 08/21/2010  . Hyperlipidemia 08/21/2010  . Psoriatic arthritis ([place]) 08/21/2010  . ASHD (arteriosclerotic heart disease) 08/21/2010  . Herpes labialis 08/21/2010  . Allergic rhinitis due to pollen 08/21/2010  . GERD (gastroesophageal reflux disease) 08/21/2010  . Kidney stones 08/21/2010    Chelsei Mcchesney, PTA 04/06/2019, 9:25 AM   [facility] [address], [place], [place], [address] Phone: [phone]   Fax:  [phone]  Name: [name] MRN: [id number] Date of Birth: [birth year]  Access Code: [email or web address]  URL: [email or web address]  Date: 04/06/2019  Prepared by: Myrene Galas   Exercises  Supine Lower Trunk Rotation - 10 reps - 3 sets - 3x daily - 7x weekly  Sidelying Thoracic and Shoulder Rotation - 10 reps - 3 sets - 3x daily - 7x weekly  Seated Thoracic Lumbar Extension - 10 reps - 3x daily - 7x weekly  Modified Thomas Stretch - 10 reps - 30 hold - 3x daily - 7x weekly  Standing Shoulder Extension with Resistance - 10 reps - 2 sets - 2x daily - 7x weekly  Seated Cat Camel - 10 reps - 3x daily - 7x weekly  Supine Bridge - 10 reps - 1 sets - 5 hold - 1x daily - 7x weekly  Sidelying Diagonal Hip Abduction - 10 reps - 1 sets - 1x daily - 7x weekly  Standing Hip Extension with Resistance at Ankles and Counter Support - 10 reps - 3 sets - 1x daily - 7x weekly  Plank on Forearms with Scapular Protraction Retraction AROM - 1 reps - 1 sets - 5 hold - 1x daily - 3x weekly  Kneeling Plank on Forearms with Scapular  Protraction Retraction AROM - 1 reps - 1 sets - 5 hold - 1x daily - 3x weekly

## 2019-04-07 DIAGNOSIS — D485 Neoplasm of uncertain behavior of skin: Secondary | ICD-10-CM | POA: Diagnosis not present

## 2019-04-10 ENCOUNTER — Ambulatory Visit: Payer: Medicare PPO

## 2019-04-11 ENCOUNTER — Encounter: Payer: Medicare PPO | Admitting: Physical Therapy

## 2019-04-13 ENCOUNTER — Encounter: Payer: Self-pay | Admitting: Physical Therapy

## 2019-04-13 ENCOUNTER — Ambulatory Visit: Payer: Medicare PPO | Attending: Physical Medicine and Rehabilitation | Admitting: Physical Therapy

## 2019-04-13 ENCOUNTER — Other Ambulatory Visit: Payer: Self-pay

## 2019-04-13 DIAGNOSIS — M545 Low back pain, unspecified: Secondary | ICD-10-CM

## 2019-04-13 DIAGNOSIS — M6281 Muscle weakness (generalized): Secondary | ICD-10-CM | POA: Diagnosis not present

## 2019-04-13 DIAGNOSIS — R293 Abnormal posture: Secondary | ICD-10-CM | POA: Diagnosis not present

## 2019-04-13 NOTE — Therapy (Signed)
[facility] [address], [place], [place], [address] Phone: [phone]   Fax:  [phone]  Physical Therapy Treatment  Patient Details  Name: [name] MRN: [id number] Date of Birth: Jul 25, 1945 Referring Provider (PT): Magnus Sinning, MD   Encounter Date: 04/13/2019  PT End of Session - 04/13/19 0851    Visit Number  15    Date for PT Re-Evaluation  04/27/19    Authorization Type  [employer]    Authorization Time Period  04/02/19 to 05/03/19    Authorization - Visit Number  6    Authorization - Number of Visits  8    PT Start Time  8032    PT Stop Time  0924    PT Time Calculation (min)  42 min    Activity Tolerance  Patient tolerated treatment well    Behavior During Therapy  [facility] for tasks assessed/performed       Past Medical History:  Diagnosis Date  . Allergy    RHINITIS  . ASHD (arteriosclerotic heart disease)   . Former smoker QUIT 08/09/2010  . Herpes labialis   . HH (hiatus hernia)   . HIV infection ([place])    POSITIVE  . Hypertriglyceridemia   . Psoriasis    ARTHRITIS  . Renal stones   . Tinnitus    CHRONIC    Past Surgical History:  Procedure Laterality Date  . CORONARY ARTERY BYPASS GRAFT  2001    There were no vitals filed for this visit.  Subjective Assessment - 04/13/19 0853    Subjective  No new complaints this AM. Weather has made it hard to walk this past week.    Pertinent History  psoriatic arthritis    Currently in Pain?  No/denies    Multiple Pain Sites  No                       [facility] Adult PT Treatment/Exercise - 04/13/19 0001      Lumbar Exercises: Stretches   Active Hamstring Stretch  Right;Left;3 reps;10 seconds   supine with strap     Lumbar Exercises: Aerobic   Nustep  L2 x 10 min LE prep      Lumbar Exercises: Supine   Bridge  10 reps;5 seconds    Bridge Limitations  Bridge with LE on red ball 10x 5 sec   Added SLR in bridge 4x bil      Lumbar Exercises: Sidelying   Hip Abduction  Both;20 reps;Weights    Hip Abduction Weights (lbs)  2.5#      Lumbar Exercises: Prone   Straight Leg Raise  --   2x10 Bil 2.5# added   Straight Leg Raises Limitations  Pillow under pelvis    Other Prone Lumbar Exercises  Forearm plank 10 sec 2x   TC to keep pelvis lifted              PT Short Term Goals - 03/30/19 0953      PT SHORT TERM GOAL #1   Title  Pt will demo consistency and independence with his initial HEP to increase flexibility and strength.    Time  3    Period  Weeks    Status  Achieved    Target Date  03/09/19      PT SHORT TERM GOAL #2   Title  Pt will report being able to return to his daily walking atleast 10 minutes at a time without  the need for a rest break.    Baseline  up to approximately 15-20 minutes    Time  3    Period  Weeks    Status  Achieved        PT Long Term Goals - 03/30/19 5830      PT LONG TERM GOAL #1   Title  Pt will have 5/5 MMT strength of the LEs which will improve his efficiency with daily activity.    Baseline  all except Rt hip extension 4/5 MMT    Time  4    Period  Weeks    Status  Partially Met    Target Date  04/29/19      PT LONG TERM GOAL #2   Title  Pt will have improved lumbar flexibility evident by his ability to actively extend beyond neutral in standing, with or without discomfort.    Time  6    Period  Weeks    Status  Achieved      PT LONG TERM GOAL #3   Title  Pt will have improved trunk strength and endurance, evident by his ability to maintain upright posture to neutral during his entire session.    Baseline  40 minutes    Time  6    Period  Weeks    Status  Achieved      PT LONG TERM GOAL #4   Title  Pt will be able to complete atleast 30 minutes of walking without increase in his low back pain once returning home.    Time  4    Period  Weeks    Status  Revised    Target Date  04/29/19      PT LONG TERM GOAL #5   Title  Pt will report  atleast 60% improvement in his pain and stiffness from the start of PT which will allow him to complete daily activity around his home with less limitation.    Baseline  50%    Time  6    Period  Weeks    Status  Revised      Additional Long Term Goals   Additional Long Term Goals  Yes      PT LONG TERM GOAL #6   Title  Pt will be independent with his advanced HEP to allow for safe progression of exercise at home once dischargede from PT.    Time  4    Period  Weeks    Status  New            Plan - 04/13/19 9407    Clinical Impression Statement  Pt continues to do well overall. He has not been abl eto walk as much due to the weather. Pt was able to find his ankle weights at home and is using them for his hip strengthening exercises. Today pt was able to hold his plank for 10 sec today. HEP progressed to add hip extension to his plank.    Personal Factors and Comorbidities  Age;Fitness    Examination-Activity Limitations  Stand    Examination-Participation Restrictions  Shop;Yard Work    Stability/Clinical Decision Making  Stable/Uncomplicated    Rehab Potential  Good    PT Frequency  1x / week    PT Duration  4 weeks    PT Treatment/Interventions  ADLs/Self Care Home Management;Aquatic Therapy;Moist Heat;Cryotherapy;Functional mobility training;Neuromuscular re-education;Therapeutic exercise;Therapeutic activities;Patient/family education;Manual techniques;Dry needling    PT Next Visit Plan  DC next visit  PT Home Exercise Plan  [email or web address]    Consulted and Agree with Plan of Care  Patient       Patient will benefit from skilled therapeutic intervention in order to improve the following deficits and impairments:  Decreased endurance, Hypomobility, Increased muscle spasms, Improper body mechanics, Decreased range of motion, Decreased activity tolerance, Decreased strength, Impaired flexibility, Postural dysfunction, Pain  Visit Diagnosis: Low back pain, unspecified back pain  laterality, unspecified chronicity, unspecified whether sciatica present  Abnormal posture  Muscle weakness (generalized)     Problem List Patient Active Problem List   Diagnosis Date Noted  . Atherosclerosis of aorta ([place]) 11/30/2018  . Encounter for long-term (current) use of high-risk medication 03/18/2018  . Drug-induced erectile dysfunction 09/25/2015  . HIV positive ([place]) 08/21/2010  . Hyperlipidemia 08/21/2010  . Psoriatic arthritis ([place]) 08/21/2010  . ASHD (arteriosclerotic heart disease) 08/21/2010  . Herpes labialis 08/21/2010  . Allergic rhinitis due to pollen 08/21/2010  . GERD (gastroesophageal reflux disease) 08/21/2010  . Kidney stones 08/21/2010    Trysten Berti, PTA 04/13/2019, 9:27 AM  [facility] [address], [place], [place], [address] Phone: [phone]   Fax:  [phone]  Name: [name] MRN: [id number] Date of Birth: 02-19-1946  Access Code: [email or web address]  URL: [email or web address]  Date: 04/13/2019  Prepared by: Myrene Galas   Exercises  Supine Lower Trunk Rotation - 10 reps - 3 sets - 3x daily - 7x weekly  Sidelying Thoracic and Shoulder Rotation - 10 reps - 3 sets - 3x daily - 7x weekly  Seated Thoracic Lumbar Extension - 10 reps - 3x daily - 7x weekly  Modified Thomas Stretch - 10 reps - 30 hold - 3x daily - 7x weekly  Standing Shoulder Extension with Resistance - 10 reps - 2 sets - 2x daily - 7x weekly  Seated Cat Camel - 10 reps - 3x daily - 7x weekly  Supine Bridge - 10 reps - 1 sets - 5 hold - 1x daily - 7x weekly  Sidelying Diagonal Hip Abduction - 10 reps - 1 sets - 1x daily - 7x weekly  Standing Hip Extension with Resistance at Ankles and Counter Support - 10 reps - 3 sets - 1x daily - 7x weekly  Plank on Forearms with Scapular Protraction Retraction AROM - 1 reps - 1 sets - 5 hold - 1x daily - 3x weekly  Kneeling Plank on Forearms with  Scapular Protraction Retraction AROM - 1 reps - 1 sets - 5 hold - 1x daily - 3x weekly  Plank with Hip Extension - 5 reps - 1 sets - 3 hold - 1x daily - 7x weekly

## 2019-04-17 ENCOUNTER — Ambulatory Visit: Payer: Medicare PPO | Attending: Internal Medicine

## 2019-04-17 DIAGNOSIS — Z23 Encounter for immunization: Secondary | ICD-10-CM | POA: Insufficient documentation

## 2019-04-17 NOTE — Progress Notes (Signed)
   Covid-19 Vaccination Clinic  Name:  [name]    MRN: [id number] DOB: January 12, 1946  04/17/2019  Mr. [name] was observed post Covid-19 immunization for 15 minutes without incidence. He was provided with Vaccine Information Sheet and instruction to access the V-Safe system.   Mr. [name] was instructed to call 911 with any severe reactions post vaccine: [place] Kitchen Difficulty breathing  . Swelling of your face and throat  . A fast heartbeat  . A bad rash all over your body  . Dizziness and weakness    Immunizations Administered    Name Date Dose VIS Date Route   Pfizer COVID-19 Vaccine 04/17/2019 12:23 PM 0.3 mL 02/18/2019 Intramuscular   Manufacturer: [place]   Lot: [id number]   [place]: [id number]

## 2019-04-18 ENCOUNTER — Encounter: Payer: Medicare PPO | Admitting: Physical Therapy

## 2019-04-20 ENCOUNTER — Ambulatory Visit: Payer: Medicare PPO | Admitting: Physical Therapy

## 2019-04-20 ENCOUNTER — Other Ambulatory Visit: Payer: Self-pay

## 2019-04-20 ENCOUNTER — Encounter: Payer: Self-pay | Admitting: Physical Therapy

## 2019-04-20 DIAGNOSIS — M6281 Muscle weakness (generalized): Secondary | ICD-10-CM

## 2019-04-20 DIAGNOSIS — M545 Low back pain, unspecified: Secondary | ICD-10-CM

## 2019-04-20 DIAGNOSIS — R293 Abnormal posture: Secondary | ICD-10-CM

## 2019-04-20 NOTE — Therapy (Signed)
[facility] [address], [address] , [place], [address] Phone: [phone]   Fax:  [phone]  Physical Therapy Treatment/Discharge  Patient Details  Name: [name] MRN: [id number] Date of Birth: 02/15/1946 Referring Provider (PT): Frederic Newton, MD   Encounter Date: 04/20/2019  PT End of Session - 04/20/19 0925    Visit Number  16    Date for PT Re-Evaluation  04/27/19    Authorization Type  [employer]ana    Authorization Time Period  04/02/19 to 05/03/19    Authorization - Visit Number  8    Authorization - Number of Visits  8    PT Start Time  0845    PT Stop Time  0926    PT Time Calculation (min)  41 min    Activity Tolerance  Patient tolerated treatment well;No increased pain    Behavior During Therapy  WFL for tasks assessed/performed       Past Medical History:  Diagnosis Date  . Allergy    RHINITIS  . ASHD (arteriosclerotic heart disease)   . Former smoker QUIT 08/09/2010  . Herpes labialis   . HH (hiatus hernia)   . HIV infection ([facility])    POSITIVE  . Hypertriglyceridemia   . Psoriasis    ARTHRITIS  . Renal stones   . Tinnitus    CHRONIC    Past Surgical History:  Procedure Laterality Date  . CORONARY ARTERY BYPASS GRAFT  2001    There were no vitals filed for this visit.  Subjective Assessment - 04/20/19 0935    Subjective  Pt states things are going well. He is pleased with the improvements he has noticed.    Pertinent History  psoriatic arthritis    Currently in Pain?  No/denies         [facility] PT Assessment - 04/20/19 0001      Assessment   Medical Diagnosis  lumbar spondylosis     Referring Provider (PT)  Frederic Newton, MD    Onset Date/Surgical Date  --   February 2020   Next MD Visit  after PT    Prior Therapy  none       Precautions   Precautions  None      Balance Screen   Has the patient fallen in the past 6 months  No    Has the patient had a decrease in  activity level because of a fear of falling?   No    Is the patient reluctant to leave their home because of a fear of falling?   No      Prior Function   Leisure  typically walking 20 minutes a day       Cognition   Overall Cognitive Status  Within Functional Limits for tasks assessed      Observation/Other Assessments   Observations  pt standing upright      Sensation   Additional Comments  denies numbness tingling at this time       AROM   Lumbar Flexion  WNL    Lumbar Extension  5 deg beyond neutral with discomfort noted end range in bilateral glutes    Lumbar - Right Rotation  limited 25%, pain free    Lumbar - Left Rotation  limited 25%, pain free       Strength   Overall Strength Comments  hip abduction 5/5 MMT; Rt hip extension 4/5 MMT       Flexibility   Soft   Tissue Assessment /Muscle Length  yes    Hamstrings  lacking 30 deg at popliteal angle BLE    Quadriceps  Rt: 120 deg, Lt: 115 deg in prone       Palpation   Spinal mobility  hypomobile lumbar and thoracic spine        Special Tests   Other special tests  (+) thomas test for ITB       Ambulation/Gait   Gait Comments  pt with improved hip extension                   [facility] Adult PT Treatment/Exercise - 04/20/19 0001      Lumbar Exercises: Stretches   Other Lumbar Stretch Exercise  seated trunk flexion walkout with UE on green physioball x20 reps       Lumbar Exercises: Aerobic   Nustep  L3       Lumbar Exercises: Standing   Other Standing Lumbar Exercises  BUE pressdown with blue TB x15 reps; partial tandem pallof press with #10lb x15 reps each side, partial tandem with #10 trunk rotation x15 reps each side     Other Standing Lumbar Exercises  power tower: sidestepping and backwards #15 x10 reps each direction              PT Education - 04/20/19 0923    Education Details  d/c and HEP updates    Person(s) Educated  Patient    Methods  Explanation;Handout    Comprehension   Verbalized understanding       PT Short Term Goals - 04/20/19 0926      PT SHORT TERM GOAL #1   Title  Pt will demo consistency and independence with his initial HEP to increase flexibility and strength.    Time  3    Period  Weeks    Status  Achieved    Target Date  03/09/19      PT SHORT TERM GOAL #2   Title  Pt will report being able to return to his daily walking atleast 10 minutes at a time without the need for a rest break.    Baseline  up to approximately 15-20 minutes    Time  3    Period  Weeks    Status  Achieved        PT Long Term Goals - 04/20/19 0926      PT LONG TERM GOAL #1   Title  Pt will have 5/5 MMT strength of the LEs which will improve his efficiency with daily activity.    Baseline  all except Rt hip extension 4/5 MMT    Time  4    Period  Weeks    Status  Partially Met      PT LONG TERM GOAL #2   Title  Pt will have improved lumbar flexibility evident by his ability to actively extend beyond neutral in standing, with or without discomfort.    Time  6    Period  Weeks    Status  Achieved      PT LONG TERM GOAL #3   Title  Pt will have improved trunk strength and endurance, evident by his ability to maintain upright posture to neutral during his entire session.    Baseline  40 minutes    Time  6    Period  Weeks    Status  Achieved      PT LONG TERM GOAL #4   Title  Pt will   be able to complete atleast 30 minutes of walking without increase in his low back pain once returning home.    Time  4    Period  Weeks    Status  Achieved      PT LONG TERM GOAL #5   Title  Pt will report atleast 60% improvement in his pain and stiffness from the start of PT which will allow him to complete daily activity around his home with less limitation.    Time  6    Period  Weeks    Status  Achieved      PT LONG TERM GOAL #6   Title  Pt will be independent with his advanced HEP to allow for safe progression of exercise at home once dischargede from PT.     Time  4    Period  Weeks    Status  Achieved            Plan - 04/20/19 0927    Clinical Impression Statement  Pt was discharged this visit having met his short and long term goals. His posture is significantly improved, and he can ambulate with improved hip extension without pain. Pt has been completing higher level core and hip strengthening exercises without significant difficulty. He is pleased with his progress and is agreeable with d/c at this time to allow for him to focus on his advanced program independently moving forward.    Personal Factors and Comorbidities  Age;Fitness    Examination-Activity Limitations  Stand    Examination-Participation Restrictions  Shop;Yard Work    Stability/Clinical Decision Making  Stable/Uncomplicated    Rehab Potential  Good    PT Frequency  1x / week    PT Duration  4 weeks    PT Treatment/Interventions  ADLs/Self Care Home Management;Aquatic Therapy;Moist Heat;Cryotherapy;Functional mobility training;Neuromuscular re-education;Therapeutic exercise;Therapeutic activities;Patient/family education;Manual techniques;Dry needling    PT Next Visit Plan  pt d/c this visit    PT Home Exercise Plan  [email or web address]    Consulted and Agree with Plan of Care  Patient       Patient will benefit from skilled therapeutic intervention in order to improve the following deficits and impairments:  Decreased endurance, Hypomobility, Increased muscle spasms, Improper body mechanics, Decreased range of motion, Decreased activity tolerance, Decreased strength, Impaired flexibility, Postural dysfunction, Pain  Visit Diagnosis: Low back pain, unspecified back pain laterality, unspecified chronicity, unspecified whether sciatica present  Abnormal posture  Muscle weakness (generalized)     Problem List Patient Active Problem List   Diagnosis Date Noted  . Atherosclerosis of aorta ([facility]) 11/30/2018  . Encounter for long-term (current) use of high-risk medication  03/18/2018  . Drug-induced erectile dysfunction 09/25/2015  . HIV positive ([facility]) 08/21/2010  . Hyperlipidemia 08/21/2010  . Psoriatic arthritis ([facility]) 08/21/2010  . ASHD (arteriosclerotic heart disease) 08/21/2010  . Herpes labialis 08/21/2010  . Allergic rhinitis due to pollen 08/21/2010  . GERD (gastroesophageal reflux disease) 08/21/2010  . Kidney stones 08/21/2010   PHYSICAL THERAPY DISCHARGE SUMMARY  Visits from Start of Care: 16  Current functional level related to goals / functional outcomes: See above for more details    Remaining deficits: See above for more details    Education / Equipment: See above for more details   Plan: Patient agrees to discharge.  Patient goals were met. Patient is being discharged due to being pleased with the current functional level.  ?????         9:36 AM,04/20/19 Sara Costella PT, DPT   [facility]  [phone]  [facility] [address], [address] [place], [place], [address] Phone: [phone]   Fax:  [phone]  Name: Kyren Butch R Tye MRN: [id number] Date of Birth: 05/07/1945   

## 2019-04-20 NOTE — Patient Instructions (Signed)
Access Code: [email or web address]  URL: [email or web address]  Date: 04/20/2019  Prepared by: Sherol Dade   Exercises  Supine Lower Trunk Rotation - 10 reps - 3 sets - 3x daily - 7x weekly  Sidelying Thoracic and Shoulder Rotation - 10 reps - 3 sets - 3x daily - 7x weekly  Seated Thoracic Lumbar Extension - 10 reps - 3x daily - 7x weekly  Modified Thomas Stretch - 10 reps - 30 hold - 3x daily - 7x weekly  Standing Shoulder Extension with Resistance - 10 reps - 2 sets - 2x daily - 7x weekly  Seated Cat Camel - 10 reps - 3x daily - 7x weekly  Supine Bridge - 10 reps - 1 sets - 5 hold - 1x daily - 7x weekly  Sidelying Diagonal Hip Abduction - 10 reps - 1 sets - 1x daily - 7x weekly  Standing Hip Extension with Resistance at Ankles and Counter Support - 10 reps - 3 sets - 1x daily - 7x weekly  Plank on Forearms with Scapular Protraction Retraction AROM - 1 reps - 1 sets - 5 hold - 1x daily - 3x weekly  Kneeling Plank on Forearms with Scapular Protraction Retraction AROM - 1 reps - 1 sets - 5 hold - 1x daily - 3x weekly  Plank with Hip Extension - 5 reps - 1 sets - 3 hold - 1x daily - 7x weekly  Standing Anti-Rotation Press with Anchored Resistance - 10 reps - 3 sets - 1x daily - 7x weekly     [facility] [address] [place], [place] [address] Phone # [phone] Fax [phone]

## 2019-04-21 ENCOUNTER — Ambulatory Visit: Payer: Medicare PPO

## 2019-04-25 ENCOUNTER — Encounter: Payer: Medicare PPO | Admitting: Physical Therapy

## 2019-04-27 ENCOUNTER — Ambulatory Visit: Payer: Medicare PPO | Admitting: Physical Therapy

## 2019-05-02 ENCOUNTER — Encounter: Payer: Medicare PPO | Admitting: Physical Therapy

## 2019-05-03 DIAGNOSIS — C4449 Other specified malignant neoplasm of skin of scalp and neck: Secondary | ICD-10-CM | POA: Diagnosis not present

## 2019-05-04 ENCOUNTER — Encounter: Payer: Medicare PPO | Admitting: Physical Therapy

## 2019-05-09 ENCOUNTER — Encounter: Payer: Medicare PPO | Admitting: Physical Therapy

## 2019-05-11 ENCOUNTER — Encounter: Payer: Medicare PPO | Admitting: Physical Therapy

## 2019-05-11 ENCOUNTER — Ambulatory Visit: Payer: Medicare PPO | Attending: Internal Medicine

## 2019-05-11 ENCOUNTER — Ambulatory Visit: Payer: Medicare PPO

## 2019-05-11 DIAGNOSIS — Z23 Encounter for immunization: Secondary | ICD-10-CM

## 2019-05-11 NOTE — Progress Notes (Signed)
   Covid-19 Vaccination Clinic  Name:  [name]    MRN: [id number] DOB: 08-14-1945  05/11/2019  [name] was observed post Covid-19 immunization for 15 minutes without incident. He was provided with Vaccine Information Sheet and instruction to access the V-Safe system.   [name] was instructed to call 911 with any severe reactions post vaccine: [place] Kitchen Difficulty breathing  . Swelling of face and throat  . A fast heartbeat  . A bad rash all over body  . Dizziness and weakness   Immunizations Administered    Name Date Dose VIS Date Route   Pfizer COVID-19 Vaccine 05/11/2019  3:30 PM 0.3 mL 02/18/2019 Intramuscular   Manufacturer: [place]   Lot: [id number]   [place]: [id number]

## 2019-05-24 ENCOUNTER — Telehealth: Payer: Self-pay

## 2019-05-24 DIAGNOSIS — L4050 Arthropathic psoriasis, unspecified: Secondary | ICD-10-CM

## 2019-05-24 MED ORDER — IBUPROFEN 800 MG PO TABS: 800.0000 mg | ORAL_TABLET | Freq: Three times a day (TID) | ORAL | 1 refills | Status: DC | PRN

## 2019-05-24 NOTE — Telephone Encounter (Signed)
I discussed the use of NSAIDs in regards to his Biktarvy as well as generally on his stomach as well as kidney function.  He is aware of this and will use it sparingly.

## 2019-05-24 NOTE — Telephone Encounter (Signed)
Pt is requesting to fill ibuprofen 800 mg . Pt has had pt and has seen the ortho but still having discomfort. It will need to go to [employer] order. Please advise [facility]

## 2019-06-07 DIAGNOSIS — C44399 Other specified malignant neoplasm of skin of other parts of face: Secondary | ICD-10-CM | POA: Diagnosis not present

## 2019-07-26 ENCOUNTER — Telehealth: Payer: Self-pay | Admitting: Physical Medicine and Rehabilitation

## 2019-07-26 NOTE — Telephone Encounter (Signed)
Is auth needed for [phone]? Scheduled for 6/8 with driver.

## 2019-07-26 NOTE — Telephone Encounter (Signed)
Left message #1

## 2019-07-26 NOTE — Telephone Encounter (Signed)
Patient is requesting an appointment for injections. Bilateral L4-5 facets 12/27/2018. Ok to repeat if helped, same problem/side, and no new injury?

## 2019-07-26 NOTE — Telephone Encounter (Signed)
ok 

## 2019-07-26 NOTE — Telephone Encounter (Signed)
Approved Tracking [id number].  Effective 07/26/19-08/25/19

## 2019-08-16 ENCOUNTER — Ambulatory Visit: Payer: Medicare PPO | Admitting: Physical Medicine and Rehabilitation

## 2019-08-16 ENCOUNTER — Encounter: Payer: Self-pay | Admitting: Physical Medicine and Rehabilitation

## 2019-08-16 ENCOUNTER — Other Ambulatory Visit: Payer: Self-pay

## 2019-08-16 ENCOUNTER — Ambulatory Visit: Payer: Self-pay

## 2019-08-16 VITALS — BP 134/73 | HR 62

## 2019-08-16 DIAGNOSIS — M47816 Spondylosis without myelopathy or radiculopathy, lumbar region: Secondary | ICD-10-CM

## 2019-08-16 MED ORDER — METHYLPREDNISOLONE ACETATE 80 MG/ML IJ SUSP
40.0000 mg | Freq: Once | INTRAMUSCULAR | Status: AC
  Administered 2019-08-16: 40 mg

## 2019-08-16 NOTE — Progress Notes (Signed)
Pt states pain across the lower back. Pt states last injection helped out a lot. Pt states PT has helped a lot with pain. Pt states being active and weight bearing makes pain worse.   .Numeric Pain Rating Scale and Functional Assessment Average Pain 5   In the last MONTH (on 0-10 scale) has pain interfered with the following?  1. General activity like being  able to carry out your everyday physical activities such as walking, climbing stairs, carrying groceries, or moving a chair?  Rating(7)   +Driver, -BT, -Dye Allergies.

## 2019-08-17 NOTE — Progress Notes (Signed)
[name] - 74 y.o. male MRN [id number]  Date of birth: 05/09/1945  Office Visit Note: Visit Date: 08/16/2019 PCP: Denita Lung, MD Referred by: Denita Lung, MD  Subjective: Chief Complaint  Patient presents with   Lower Back - Pain   HPI: Kitai Purdom is a 74 y.o. male who comes in today for planned Bilateral L4-L5 lumbar facet/medial branch block with fluoroscopic guidance.  The patient has failed conservative care including home exercise, medications, time and activity modification.  This injection will be diagnostic and hopefully therapeutic.  Please see requesting physician notes for further details and justification.    Prior injection which was intra-articular facet joint block in October of last year really gave him quite a bit of relief up until just recently.  He has had exacerbation of the symptoms across the lower back.  No leg pain.  He has been in physical therapy and finished that at [place], [facility].  He reports continued doing spine exercises and strengthening.  X-ray revealed significant facet arthritis and degenerative change.  He has not had MRI of the lumbar spine but has no red flag complaints at this point.  His case is complicated by history of HIV infection and management as well as psoriatic arthritis.  Depending on relief and longevity would look at diagnostic medial branch blocks and radiofrequency ablation.    ROS Otherwise per HPI.  Assessment & Plan: Visit Diagnoses:  1. Spondylosis without myelopathy or radiculopathy, lumbar region     Plan: No additional findings.   Meds & Orders:  Meds ordered this encounter  Medications   methylPREDNISolone acetate (DEPO-MEDROL) injection 40 mg    Orders Placed This Encounter  Procedures   Facet Injection   XR C-ARM NO REPORT    Follow-up: Return if symptoms worsen or fail to improve.   Procedures: No procedures performed  Lumbar Facet Joint Intra-Articular  Injection(s) with Fluoroscopic Guidance  Patient: Quince Santana      Date of Birth: 06-14-1945 MRN: [id number] PCP: Denita Lung, MD      Visit Date: 08/16/2019   Universal Protocol:    Date/Time: 08/16/2019  Consent Given By: the patient  Position: PRONE   Additional Comments: Vital signs were monitored before and after the procedure. Patient was prepped and draped in the usual sterile fashion. The correct patient, procedure, and site was verified.   Injection Procedure Details:  Procedure Site One Meds Administered:  Meds ordered this encounter  Medications   methylPREDNISolone acetate (DEPO-MEDROL) injection 40 mg     Laterality: Bilateral  Location/Site:  L4-L5  Needle size: 22 guage  Needle type: Spinal  Needle Placement: Articular  Findings:  -Comments: Excellent flow of contrast producing a partial arthrogram.  Procedure Details: The fluoroscope beam is vertically oriented in AP, and the inferior recess is visualized beneath the lower pole of the inferior apophyseal process, which represents the target point for needle insertion. When direct visualization is difficult the target point is located at the medial projection of the vertebral pedicle. The region overlying each aforementioned target is locally anesthetized with a 1 to 2 ml. volume of 1% Lidocaine without Epinephrine.   The spinal needle was inserted into each of the above mentioned facet joints using biplanar fluoroscopic guidance. A 0.25 to 0.5 ml. volume of Isovue-250 was injected and a partial facet joint arthrogram was obtained. A single spot film was obtained of the resulting arthrogram.    One to  1.25 ml of the steroid/anesthetic solution was then injected into each of the facet joints noted above.   Additional Comments:  The patient tolerated the procedure well Dressing: 2 x 2 sterile gauze and Band-Aid    Post-procedure details: Patient was observed during the  procedure. Post-procedure instructions were reviewed.  Patient left the clinic in stable condition.      Clinical History: EXAM: LUMBAR SPINE - COMPLETE 4+ VIEW  COMPARISON:  None.  FINDINGS: There are 5 non rib-bearing lumbar type vertebral bodies.  Normal alignment of the lumbar spine. No anterolisthesis or retrolisthesis  Lumbar vertebral body heights are preserved.  Mild to moderate multilevel lumbar spine DDD, worse at L1-L2 and L5-S1 with disc space height loss, endplate irregularity and sclerosis. There is partial ossification of the T12-L1 intervertebral disc.  Limited visualization of the bilateral SI joints and hips is normal.  Prominent phleboliths overlies the right hemipelvis. Atherosclerotic plaque within a potentially mildly ectatic abdominal aorta measuring 2.7 cm in diameter. Vascular calcifications overlies expected location of the splenic artery. Approximately 0.5 cm opacity overlying the left renal fossa may represent a renal stone.  IMPRESSION: 1. Mild-to-moderate multilevel lumbar spine DDD, worse at L1-L2 and L5-S1. 2. Atherosclerotic plaque within a potentially ectatic abdominal aorta measuring approximately 2.7 cm in diameter. Further evaluation with abdominal aortic ultrasound could be performed as clinically indicated. 3. Potential left-sided nephrolithiasis. Clinical correlation is advised. Further evaluation renal ultrasound could be performed as indicated.   Electronically Signed   By: Sandi Mariscal M.D.   On: 11/30/2018 07:24   He reports that he quit smoking about 9 years ago. His smoking use included cigarettes. He has never used smokeless tobacco. No results for input(s): HGBA1C, LABURIC in the last 8760 hours.  Objective:  VS:  HT:     WT:    BMI:      BP:134/73   HR:62bpm   TEMP: ( )   RESP:  Physical Exam Constitutional:      General: He is not in acute distress.    Appearance: Normal appearance. He is not  ill-appearing.  HENT:     Head: Normocephalic and atraumatic.     Right Ear: External ear normal.     Left Ear: External ear normal.  Eyes:     Extraocular Movements: Extraocular movements intact.  Cardiovascular:     Rate and Rhythm: Normal rate.     Pulses: Normal pulses.  Abdominal:     General: There is no distension.     Palpations: Abdomen is soft.  Musculoskeletal:        General: No tenderness or signs of injury.     Right lower leg: No edema.     Left lower leg: No edema.     Comments: Patient has good distal strength without clonus. Exam shows concordant low back pain with facet joint loading and extension.  Inspection reveals significant arthritic changes of the hands bilaterally.  Skin:    Findings: No erythema or rash.  Neurological:     General: No focal deficit present.     Mental Status: He is alert and oriented to person, place, and time.     Sensory: No sensory deficit.     Motor: No weakness or abnormal muscle tone.     Coordination: Coordination normal.  Psychiatric:        Mood and Affect: Mood normal.        Behavior: Behavior normal.     Ortho Exam  Imaging: XR  C-ARM NO REPORT  Result Date: 08/16/2019 Please see Notes tab for imaging impression.   Past Medical/Family/Surgical/Social History: Medications & Allergies reviewed per EMR, new medications updated. Patient Active Problem List   Diagnosis Date Noted   Atherosclerosis of aorta ([place]) 11/30/2018   Encounter for long-term (current) use of high-risk medication 03/18/2018   Drug-induced erectile dysfunction 09/25/2015   HIV positive ([place]) 08/21/2010   Hyperlipidemia 08/21/2010   Psoriatic arthritis ([place]) 08/21/2010   ASHD (arteriosclerotic heart disease) 08/21/2010   Herpes labialis 08/21/2010   Allergic rhinitis due to pollen 08/21/2010   GERD (gastroesophageal reflux disease) 08/21/2010   Kidney stones 08/21/2010   Past Medical History:  Diagnosis Date   Allergy     RHINITIS   ASHD (arteriosclerotic heart disease)    Former smoker QUIT 08/09/2010   Herpes labialis    HH (hiatus hernia)    HIV infection ([place])    POSITIVE   Hypertriglyceridemia    Psoriasis    ARTHRITIS   Renal stones    Tinnitus    CHRONIC   History reviewed. No pertinent family history. Past Surgical History:  Procedure Laterality Date   CORONARY ARTERY BYPASS GRAFT  2001   Social History   Occupational History   Not on file  Tobacco Use   Smoking status: Former Smoker    Types: Cigarettes    Quit date: 08/09/2010    Years since quitting: 9.0   Smokeless tobacco: Never Used  Substance and Sexual Activity   Alcohol use: Yes    Alcohol/week: 4.0 standard drinks    Types: 4 Glasses of wine per week   Drug use: No   Sexual activity: Yes

## 2019-08-17 NOTE — Procedures (Signed)
Lumbar Facet Joint Intra-Articular Injection(s) with Fluoroscopic Guidance  Patient: [name]      Date of Birth: [birth year] MRN: [id number] PCP: Denita Lung, MD      Visit Date: 08/16/2019   Universal Protocol:    Date/Time: 08/16/2019  Consent Given By: the patient  Position: PRONE   Additional Comments: Vital signs were monitored before and after the procedure. Patient was prepped and draped in the usual sterile fashion. The correct patient, procedure, and site was verified.   Injection Procedure Details:  Procedure Site One Meds Administered:  Meds ordered this encounter  Medications  . methylPREDNISolone acetate (DEPO-MEDROL) injection 40 mg     Laterality: Bilateral  Location/Site:  L4-L5  Needle size: 22 guage  Needle type: Spinal  Needle Placement: Articular  Findings:  -Comments: Excellent flow of contrast producing a partial arthrogram.  Procedure Details: The fluoroscope beam is vertically oriented in AP, and the inferior recess is visualized beneath the lower pole of the inferior apophyseal process, which represents the target point for needle insertion. When direct visualization is difficult the target point is located at the medial projection of the vertebral pedicle. The region overlying each aforementioned target is locally anesthetized with a 1 to 2 ml. volume of 1% Lidocaine without Epinephrine.   The spinal needle was inserted into each of the above mentioned facet joints using biplanar fluoroscopic guidance. A 0.25 to 0.5 ml. volume of Isovue-250 was injected and a partial facet joint arthrogram was obtained. A single spot film was obtained of the resulting arthrogram.    One to 1.25 ml of the steroid/anesthetic solution was then injected into each of the facet joints noted above.   Additional Comments:  The patient tolerated the procedure well Dressing: 2 x 2 sterile gauze and Band-Aid    Post-procedure details: Patient was  observed during the procedure. Post-procedure instructions were reviewed.  Patient left the clinic in stable condition.

## 2019-09-06 ENCOUNTER — Ambulatory Visit: Payer: Medicare PPO | Admitting: Family Medicine

## 2019-09-06 ENCOUNTER — Other Ambulatory Visit: Payer: Self-pay

## 2019-09-06 ENCOUNTER — Encounter: Payer: Self-pay | Admitting: Family Medicine

## 2019-09-06 VITALS — BP 98/62 | HR 61 | Temp 98.0°F | Wt 132.2 lb

## 2019-09-06 DIAGNOSIS — L4050 Arthropathic psoriasis, unspecified: Secondary | ICD-10-CM

## 2019-09-06 DIAGNOSIS — R634 Abnormal weight loss: Secondary | ICD-10-CM

## 2019-09-06 DIAGNOSIS — Z79899 Other long term (current) drug therapy: Secondary | ICD-10-CM | POA: Diagnosis not present

## 2019-09-06 DIAGNOSIS — G8929 Other chronic pain: Secondary | ICD-10-CM | POA: Diagnosis not present

## 2019-09-06 DIAGNOSIS — F439 Reaction to severe stress, unspecified: Secondary | ICD-10-CM | POA: Diagnosis not present

## 2019-09-06 DIAGNOSIS — N1831 Chronic kidney disease, stage 3a: Secondary | ICD-10-CM | POA: Diagnosis not present

## 2019-09-06 DIAGNOSIS — E785 Hyperlipidemia, unspecified: Secondary | ICD-10-CM | POA: Diagnosis not present

## 2019-09-06 DIAGNOSIS — Z21 Asymptomatic human immunodeficiency virus [HIV] infection status: Secondary | ICD-10-CM | POA: Diagnosis not present

## 2019-09-06 DIAGNOSIS — M545 Low back pain, unspecified: Secondary | ICD-10-CM

## 2019-09-06 NOTE — Progress Notes (Signed)
   Subjective:    Patient ID: [name], male    DOB: [birth year], 74 y.o.   MRN: [id number]  HPI He continues to have difficulty with back pain and does have an underlying history of psoriatic arthritis. He has had epidural injections and has gotten some relief but not enough to take it away completely. He has been using NSAIDs. Also recently he was given some codeine which she has used intermittently and he states that this plus the inside has helped with his pain. He has a follow-up appointment with Dr. Ernestina Patches for possible epidural and consideration of an MRI if not improving. He has noted an 8 pound weight loss in the last several months. He did have a Cologuard done in August 2020 which was normal. No nausea, vomiting, change in stool patterns, abdominal pain. 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 Review of the record also indicates CKD. He continues on his HIV meds and is having no difficulty with them. He also has a history of hyperlipidemia.  Review of Systems     Objective:   Physical Exam Alert and in no distress. Tympanic membranes and canals are normal. Pharyngeal area is normal. Neck is supple without adenopathy or thyromegaly. Cardiac exam shows a regular sinus rhythm without murmurs or gallops. Lungs are clear to auscultation. Obvious deformities noted both hands with ulnar deviation of the fingers.       Assessment & Plan:  Weight loss - Plan: CBC with Differential/Platelet, Comprehensive metabolic panel, Lipid panel  Psoriatic arthritis (HCC)  HIV positive ([place]) - Plan: T-helper cells (CD4) count (not at [facility]), HIV 1 RNA quant-no reflex-bld, CBC with Differential/Platelet, Comprehensive metabolic panel, Lipid panel  Hyperlipidemia, unspecified hyperlipidemia type  Encounter for long-term (current) use of high-risk medication  Chronic midline low back pain without sciatica  Stage 3a chronic kidney  disease  Stress If the blood work looks reasonable may consider referring him to GI for further evaluation of the weight loss. He will continue to be followed by Dr. Ernestina Patches for the back pain. Continue on his HIV medications. No intervention needed at this point considering the kidney function. I discussed the stress that he is under and the fact that that could be the major player for his weight loss. 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 He has had to put some of his own money and due to Covid restrictions

## 2019-09-07 LAB — T-HELPER CELLS (CD4) COUNT (NOT AT ARMC)
% CD 4 Pos. Lymph.: 33.2 % (ref 30.8–58.5)
Absolute CD 4 Helper: 398 /uL (ref 359–1519)
Basophils Absolute: 0 10*3/uL (ref 0.0–0.2)
Basos: 0 %
EOS (ABSOLUTE): 0.1 10*3/uL (ref 0.0–0.4)
Eos: 1 %
Hematocrit: 42.8 % (ref 37.5–51.0)
Hemoglobin: 14.9 g/dL (ref 13.0–17.7)
Immature Grans (Abs): 0 10*3/uL (ref 0.0–0.1)
Immature Granulocytes: 0 %
Lymphocytes Absolute: 1.2 10*3/uL (ref 0.7–3.1)
Lymphs: 20 %
MCH: 33.9 pg — ABNORMAL HIGH (ref 26.6–33.0)
MCHC: 34.8 g/dL (ref 31.5–35.7)
MCV: 97 fL (ref 79–97)
Monocytes Absolute: 0.3 10*3/uL (ref 0.1–0.9)
Monocytes: 5 %
Neutrophils Absolute: 4.2 10*3/uL (ref 1.4–7.0)
Neutrophils: 74 %
Platelets: 126 10*3/uL — ABNORMAL LOW (ref 150–450)
RBC: 4.4 x10E6/uL (ref 4.14–5.80)
RDW: 13 % (ref 11.6–15.4)
WBC: 5.7 10*3/uL (ref 3.4–10.8)

## 2019-09-07 LAB — COMPREHENSIVE METABOLIC PANEL
ALT: 18 IU/L (ref 0–44)
AST: 21 IU/L (ref 0–40)
Albumin/Globulin Ratio: 2 (ref 1.2–2.2)
Albumin: 4.7 g/dL (ref 3.7–4.7)
Alkaline Phosphatase: 62 IU/L (ref 48–121)
BUN/Creatinine Ratio: 17 (ref 10–24)
BUN: 23 mg/dL (ref 8–27)
Bilirubin Total: 0.6 mg/dL (ref 0.0–1.2)
CO2: 19 mmol/L — ABNORMAL LOW (ref 20–29)
Calcium: 10 mg/dL (ref 8.6–10.2)
Chloride: 97 mmol/L (ref 96–106)
Creatinine, Ser: 1.33 mg/dL — ABNORMAL HIGH (ref 0.76–1.27)
GFR calc Af Amer: 61 mL/min/{1.73_m2} (ref >59)
GFR calc non Af Amer: 53 mL/min/{1.73_m2} — ABNORMAL LOW (ref >59)
Globulin, Total: 2.3 g/dL (ref 1.5–4.5)
Glucose: 88 mg/dL (ref 65–99)
Potassium: 4.4 mmol/L (ref 3.5–5.2)
Sodium: 139 mmol/L (ref 134–144)
Total Protein: 7 g/dL (ref 6.0–8.5)

## 2019-09-07 LAB — HIV-1 RNA QUANT-NO REFLEX-BLD: HIV-1 RNA Viral Load: <20 copies/mL

## 2019-09-07 LAB — LIPID PANEL
Chol/HDL Ratio: 3.1 ratio (ref 0.0–5.0)
Cholesterol, Total: 115 mg/dL (ref 100–199)
HDL: 37 mg/dL — ABNORMAL LOW (ref >39)
LDL Chol Calc (NIH): 54 mg/dL (ref 0–99)
Triglycerides: 140 mg/dL (ref 0–149)
VLDL Cholesterol Cal: 24 mg/dL (ref 5–40)

## 2019-09-26 ENCOUNTER — Ambulatory Visit: Payer: Medicare PPO | Admitting: Family Medicine

## 2019-10-08 ENCOUNTER — Other Ambulatory Visit: Payer: Self-pay | Admitting: Family Medicine

## 2019-10-08 DIAGNOSIS — L4050 Arthropathic psoriasis, unspecified: Secondary | ICD-10-CM

## 2019-10-10 NOTE — Telephone Encounter (Signed)
[employer] is requesting to fill pt ibuprofen. Please advise [facility]

## 2019-11-18 ENCOUNTER — Telehealth: Payer: Self-pay | Admitting: Family Medicine

## 2019-11-18 ENCOUNTER — Telehealth: Payer: Self-pay | Admitting: Physical Medicine and Rehabilitation

## 2019-11-18 DIAGNOSIS — N521 Erectile dysfunction due to diseases classified elsewhere: Secondary | ICD-10-CM

## 2019-11-18 MED ORDER — TADALAFIL 5 MG PO TABS: 5.0000 mg | ORAL_TABLET | Freq: Every day | ORAL | 0 refills | Status: DC | PRN

## 2019-11-18 MED ORDER — BELSOMRA 10 MG PO TABS: 1.0000 | ORAL_TABLET | Freq: Every evening | ORAL | 0 refills | Status: DC | PRN

## 2019-11-18 NOTE — Telephone Encounter (Signed)
Ok, fyi need to consider RFA

## 2019-11-18 NOTE — Telephone Encounter (Signed)
Pt would like to get scheduled for back injections   [phone]

## 2019-11-18 NOTE — Telephone Encounter (Signed)
Patient had bilateral L4-5 facets 08/16/19. Ok to repeat if helped, same problem/side, and no new injury?

## 2019-11-18 NOTE — Telephone Encounter (Signed)
  Pt called requesting refill on  Belsomra and Cialis  [facility] [address]

## 2019-11-18 NOTE — Telephone Encounter (Signed)
Pt was approve for [id number], #[id number]

## 2019-11-18 NOTE — Telephone Encounter (Signed)
Needs auth for [phone]. Scheduled for 9/22.

## 2019-11-30 ENCOUNTER — Other Ambulatory Visit: Payer: Self-pay

## 2019-11-30 ENCOUNTER — Encounter: Payer: Self-pay | Admitting: Physical Medicine and Rehabilitation

## 2019-11-30 ENCOUNTER — Ambulatory Visit: Payer: Self-pay

## 2019-11-30 ENCOUNTER — Ambulatory Visit (INDEPENDENT_AMBULATORY_CARE_PROVIDER_SITE_OTHER): Payer: Medicare PPO | Admitting: Physical Medicine and Rehabilitation

## 2019-11-30 VITALS — BP 122/73 | HR 74

## 2019-11-30 DIAGNOSIS — M5416 Radiculopathy, lumbar region: Secondary | ICD-10-CM | POA: Diagnosis not present

## 2019-11-30 DIAGNOSIS — M47816 Spondylosis without myelopathy or radiculopathy, lumbar region: Secondary | ICD-10-CM | POA: Diagnosis not present

## 2019-11-30 DIAGNOSIS — M5136 Other intervertebral disc degeneration, lumbar region: Secondary | ICD-10-CM | POA: Diagnosis not present

## 2019-11-30 MED ORDER — METHYLPREDNISOLONE ACETATE 80 MG/ML IJ SUSP
80.0000 mg | Freq: Once | INTRAMUSCULAR | Status: AC
Start: 2019-11-30 — End: 2019-11-30
  Administered 2019-11-30: 80 mg

## 2019-11-30 NOTE — Progress Notes (Signed)
Pt state lower back pain that travels down both legs. Pt state the right side is worse than the left.. Pt states walking makes the pain worse. Pt state he take pain meds to ease the pain.  Pt has hx of inj on 08/16/19 pt state the inj was great and it lasted for three months.  Numeric Pain Rating Scale and Functional Assessment Average Pain 6   In the last MONTH (on 0-10 scale) has pain interfered with the following?  1. General activity like being  able to carry out your everyday physical activities such as walking, climbing stairs, carrying groceries, or moving a chair?  Rating(8)   +Driver, -BT, -Dye Allergies.

## 2019-11-30 NOTE — Progress Notes (Signed)
[name] - 74 y.o. male MRN [id number]  Date of birth: 11/12/1945  Office Visit Note: Visit Date: 11/30/2019 PCP: Denita Lung, MD Referred by: Denita Lung, MD  Subjective: Chief Complaint  Patient presents with  . Lower Back - Pain   HPI: [name] is a 74 y.o. male who comes in today For evaluation management of chronic worsening exacerbation of axial low back pain.  We have seen the patient initially for consultation and those notes can be reviewed.  He is followed by Dr. Redmond School.  He has been doing well with 3 to 4 months of relief with intra-articular facet joint block at L4-5.  X-rays show spondylosis at L4-5 and L5-S1.  Last time I saw him was in June we completed injection at that time and he is done extremely well up until just recently.  No specific trauma.  Back pain worse with standing and walking.  No real pain into the legs although he does get some referral in the upper thigh.  Pain is somewhat worse on the right than the left.  No new issues or concerning factors.  Discussed again today the usefulness of lumbar spine MRI to rule out other conditions that we may have missed even though he has done well with the injection treatment as well as physical therapy and medication management.  His average pain is a 6 out of 10 it does affect his activities of daily living.  Review of Systems  Musculoskeletal: Positive for back pain and joint pain.  All other systems reviewed and are negative.  Otherwise per HPI.  Assessment & Plan: Visit Diagnoses:  1. Spondylosis without myelopathy or radiculopathy, lumbar region   2. Other intervertebral disc degeneration, lumbar region   3. Lumbar radiculopathy     Plan: Findings:  Chronic history of back pain that seems to be a combination of lumbar spondylosis and myofascial pain but cannot rule out some likelihood of possible lumbar stenosis given his age and just symptomatic issues that are recurring.  He  would be a decent candidate for radiofrequency ablation at some point of the joints may be last longer but he is doing well with 3 to 4 months at a clip from the injection.  He has had physical therapy has had medication management etc.  At this point we are going to repeat the facet joint injection today.  Justification for the facet joint blocks is the fact that he has gotten 3 to 4 months of relief of almost 100% pain relief.  This is lasted 3 to 4 months and it does increase his functional standpoint allowing him to do more of what he likes to do.  Is also decreased his use of medications.  Injections have been informed as part of a comprehensive program.  I also want to obtain MRI of the lumbar spine to rule out lumbar stenosis or any other factor that we may have missed even though he is done well with the injection.    Meds & Orders:  Meds ordered this encounter  Medications  . methylPREDNISolone acetate (DEPO-MEDROL) injection 80 mg    Orders Placed This Encounter  Procedures  . Facet Injection  . XR C-ARM NO REPORT  . MR LUMBAR SPINE WO CONTRAST    Follow-up: Return for MRI review after completion.   Procedures: No procedures performed  Lumbar Facet Joint Intra-Articular Injection(s) with Fluoroscopic Guidance  Patient: [name]  Date of Birth: [birth year] MRN: [id number] PCP: Denita Lung, MD      Visit Date: 11/30/2019   Universal Protocol:    Date/Time: 11/30/2019  Consent Given By: the patient  Position: PRONE   Additional Comments: Vital signs were monitored before and after the procedure. Patient was prepped and draped in the usual sterile fashion. The correct patient, procedure, and site was verified.   Injection Procedure Details:  Procedure Site One Meds Administered:  Meds ordered this encounter  Medications  . methylPREDNISolone acetate (DEPO-MEDROL) injection 80 mg     Laterality: Bilateral  Location/Site:  L4-L5  Needle size:  22 guage  Needle type: Spinal  Needle Placement: Articular  Findings:  -Comments: Excellent flow of contrast producing a partial arthrogram.  Procedure Details: The fluoroscope beam is vertically oriented in AP, and the inferior recess is visualized beneath the lower pole of the inferior apophyseal process, which represents the target point for needle insertion. When direct visualization is difficult the target point is located at the medial projection of the vertebral pedicle. The region overlying each aforementioned target is locally anesthetized with a 1 to 2 ml. volume of 1% Lidocaine without Epinephrine.   The spinal needle was inserted into each of the above mentioned facet joints using biplanar fluoroscopic guidance. A 0.25 to 0.5 ml. volume of Isovue-250 was injected and a partial facet joint arthrogram was obtained. A single spot film was obtained of the resulting arthrogram.    One to 1.25 ml of the steroid/anesthetic solution was then injected into each of the facet joints noted above.   Additional Comments:  The patient tolerated the procedure well Dressing: 2 x 2 sterile gauze and Band-Aid    Post-procedure details: Patient was observed during the procedure. Post-procedure instructions were reviewed.  Patient left the clinic in stable condition.     Clinical History: EXAM: LUMBAR SPINE - COMPLETE 4+ VIEW  COMPARISON:  None.  FINDINGS: There are 5 non rib-bearing lumbar type vertebral bodies.  Normal alignment of the lumbar spine. No anterolisthesis or retrolisthesis  Lumbar vertebral body heights are preserved.  Mild to moderate multilevel lumbar spine DDD, worse at L1-L2 and L5-S1 with disc space height loss, endplate irregularity and sclerosis. There is partial ossification of the T12-L1 intervertebral disc.  Limited visualization of the bilateral SI joints and hips is normal.  Prominent phleboliths overlies the right hemipelvis.  Atherosclerotic plaque within a potentially mildly ectatic abdominal aorta measuring 2.7 cm in diameter. Vascular calcifications overlies expected location of the splenic artery. Approximately 0.5 cm opacity overlying the left renal fossa may represent a renal stone.  IMPRESSION: 1. Mild-to-moderate multilevel lumbar spine DDD, worse at L1-L2 and L5-S1. 2. Atherosclerotic plaque within a potentially ectatic abdominal aorta measuring approximately 2.7 cm in diameter. Further evaluation with abdominal aortic ultrasound could be performed as clinically indicated. 3. Potential left-sided nephrolithiasis. Clinical correlation is advised. Further evaluation renal ultrasound could be performed as indicated.   Electronically Signed   By: Sandi Mariscal M.D.   On: 11/30/2018 07:24   He reports that he quit smoking about 9 years ago. His smoking use included cigarettes. He has never used smokeless tobacco. No results for input(s): HGBA1C, LABURIC in the last 8760 hours.  Objective:  VS:  HT:    WT:   BMI:     BP:122/73  HR:74bpm  TEMP: ( )  RESP:  Physical Exam Vitals and nursing note reviewed.  Constitutional:      General: He is not  in acute distress.    Appearance: Normal appearance. He is well-developed.  HENT:     Head: Normocephalic and atraumatic.  Eyes:     Conjunctiva/sclera: Conjunctivae normal.     Pupils: Pupils are equal, round, and reactive to light.  Cardiovascular:     Rate and Rhythm: Normal rate.     Pulses: Normal pulses.     Heart sounds: Normal heart sounds.  Pulmonary:     Effort: Pulmonary effort is normal. No respiratory distress.  Abdominal:     General: There is no distension.     Palpations: Abdomen is soft.  Musculoskeletal:     Cervical back: Normal range of motion and neck supple. No rigidity.     Right lower leg: No edema.     Left lower leg: No edema.     Comments: Patient somewhat slow to rise from a seated position to full extension.   There is concordant low back pain with facet loading and lumbar spine extension rotation.  There are no definitive trigger points but the patient is somewhat tender across the lower back and PSIS.  There is no pain with hip rotation.   Skin:    General: Skin is warm and dry.     Findings: No erythema or rash.  Neurological:     General: No focal deficit present.     Mental Status: He is alert and oriented to person, place, and time.     Sensory: No sensory deficit.     Coordination: Coordination normal.     Gait: Gait normal.  Psychiatric:        Mood and Affect: Mood normal.        Behavior: Behavior normal.     Ortho Exam  Imaging: XR C-ARM NO REPORT  Result Date: 11/30/2019 Please see Notes tab for imaging impression.   Past Medical/Family/Surgical/Social History: Medications & Allergies reviewed per EMR, new medications updated. Patient Active Problem List   Diagnosis Date Noted  . Stage 3a chronic kidney disease 09/06/2019  . Atherosclerosis of aorta ([place]) 11/30/2018  . Encounter for long-term (current) use of high-risk medication 03/18/2018  . Drug-induced erectile dysfunction 09/25/2015  . HIV positive ([place]) 08/21/2010  . Hyperlipidemia 08/21/2010  . Psoriatic arthritis ([place]) 08/21/2010  . ASHD (arteriosclerotic heart disease) 08/21/2010  . Herpes labialis 08/21/2010  . Allergic rhinitis due to pollen 08/21/2010  . GERD (gastroesophageal reflux disease) 08/21/2010  . Kidney stones 08/21/2010   Past Medical History:  Diagnosis Date  . Allergy    RHINITIS  . ASHD (arteriosclerotic heart disease)   . Former smoker QUIT 08/09/2010  . Herpes labialis   . HH (hiatus hernia)   . HIV infection ([place])    POSITIVE  . Hypertriglyceridemia   . Psoriasis    ARTHRITIS  . Renal stones   . Tinnitus    CHRONIC   History reviewed. No pertinent family history. Past Surgical History:  Procedure Laterality Date  . CORONARY ARTERY BYPASS GRAFT  2001   Social History    Occupational History  . Not on file  Tobacco Use  . Smoking status: Former Smoker    Types: Cigarettes    Quit date: 08/09/2010    Years since quitting: 9.3  . Smokeless tobacco: Never Used  Substance and Sexual Activity  . Alcohol use: Yes    Alcohol/week: 4.0 standard drinks    Types: 4 Glasses of wine per week  . Drug use: No  . Sexual activity: Yes

## 2019-12-01 ENCOUNTER — Encounter: Payer: Self-pay | Admitting: Physical Medicine and Rehabilitation

## 2019-12-01 NOTE — Procedures (Signed)
Lumbar Facet Joint Intra-Articular Injection(s) with Fluoroscopic Guidance  Patient: [name]      Date of Birth: 1945/12/16 MRN: [id number] PCP: Denita Lung, MD      Visit Date: 11/30/2019   Universal Protocol:    Date/Time: 11/30/2019  Consent Given By: the patient  Position: PRONE   Additional Comments: Vital signs were monitored before and after the procedure. Patient was prepped and draped in the usual sterile fashion. The correct patient, procedure, and site was verified.   Injection Procedure Details:  Procedure Site One Meds Administered:  Meds ordered this encounter  Medications  . methylPREDNISolone acetate (DEPO-MEDROL) injection 80 mg     Laterality: Bilateral  Location/Site:  L4-L5  Needle size: 22 guage  Needle type: Spinal  Needle Placement: Articular  Findings:  -Comments: Excellent flow of contrast producing a partial arthrogram.  Procedure Details: The fluoroscope beam is vertically oriented in AP, and the inferior recess is visualized beneath the lower pole of the inferior apophyseal process, which represents the target point for needle insertion. When direct visualization is difficult the target point is located at the medial projection of the vertebral pedicle. The region overlying each aforementioned target is locally anesthetized with a 1 to 2 ml. volume of 1% Lidocaine without Epinephrine.   The spinal needle was inserted into each of the above mentioned facet joints using biplanar fluoroscopic guidance. A 0.25 to 0.5 ml. volume of Isovue-250 was injected and a partial facet joint arthrogram was obtained. A single spot film was obtained of the resulting arthrogram.    One to 1.25 ml of the steroid/anesthetic solution was then injected into each of the facet joints noted above.   Additional Comments:  The patient tolerated the procedure well Dressing: 2 x 2 sterile gauze and Band-Aid    Post-procedure details: Patient was  observed during the procedure. Post-procedure instructions were reviewed.  Patient left the clinic in stable condition.

## 2019-12-20 ENCOUNTER — Ambulatory Visit: Payer: Medicare PPO | Admitting: Family Medicine

## 2019-12-20 ENCOUNTER — Other Ambulatory Visit: Payer: Self-pay

## 2019-12-20 ENCOUNTER — Encounter: Payer: Self-pay | Admitting: Family Medicine

## 2019-12-20 VITALS — BP 110/64 | HR 67 | Temp 97.5°F | Wt 127.4 lb

## 2019-12-20 DIAGNOSIS — R634 Abnormal weight loss: Secondary | ICD-10-CM | POA: Diagnosis not present

## 2019-12-20 DIAGNOSIS — Z23 Encounter for immunization: Secondary | ICD-10-CM

## 2019-12-20 DIAGNOSIS — N521 Erectile dysfunction due to diseases classified elsewhere: Secondary | ICD-10-CM

## 2019-12-20 DIAGNOSIS — Z21 Asymptomatic human immunodeficiency virus [HIV] infection status: Secondary | ICD-10-CM | POA: Diagnosis not present

## 2019-12-20 LAB — HEMOCCULT GUIAC POC 1CARD (OFFICE): Fecal Occult Blood, POC: NEGATIVE

## 2019-12-20 MED ORDER — TADALAFIL 5 MG PO TABS: 5.0000 mg | ORAL_TABLET | Freq: Every day | ORAL | 1 refills | Status: DC | PRN

## 2019-12-20 NOTE — Progress Notes (Signed)
° °  Subjective:    Patient ID: [name], male    DOB: Nov 07, 1945, 74 y.o.   MRN: [id number]  HPI He is here for consult concerning continued difficulty with weight loss.  He is noted this over the last several months.  He has no nausea, vomiting, diarrhea, early satiety, abdominal pain, dysphagia, trouble with any particular foods.  He has had a Cologuard which was negative.  His HIV status remains quite stable.   Review of Systems     Objective:   Physical Exam Alert and in no distress.  Cardiac exam shows regular rhythm without murmurs or gallops.  Lungs are clear to auscultation.  Abdominal exam shows no masses or tenderness but the abdominal aorta is palpable.  Previous ultrasound showed an ectatic aorta with a repeat in several years.  Review of the record shows a 13 pound weight loss. He would also like to have refill on his Cialis. Stool was guaiac negative      Assessment & Plan:  Weight loss - Plan: CBC with Differential/Platelet, Comprehensive metabolic panel, CT Abdomen Pelvis W Contrast  Need for influenza vaccination - Plan: Flu Vaccine QUAD High Dose(Fluad), CANCELED: Flu vaccine HIGH DOSE PF (Fluzone High dose)  Immunization, viral disease - Plan: Pfizer SARS-COV-2 Vaccine  HIV positive ([place])  Erectile dysfunction due to diseases classified elsewhere - Plan: tadalafil (CIALIS) 5 MG tablet I explained that my concern is an occult malignancy.  This also could possibly be HIV related but I doubt that.  Follow-up pending CT scan.

## 2019-12-21 ENCOUNTER — Ambulatory Visit
Admission: RE | Admit: 2019-12-21 | Discharge: 2019-12-21 | Disposition: A | Discharge status: Home / Self Care | Payer: Medicare PPO | Source: Ambulatory Visit | Attending: Family Medicine | Admitting: Family Medicine

## 2019-12-21 DIAGNOSIS — R634 Abnormal weight loss: Secondary | ICD-10-CM

## 2019-12-21 DIAGNOSIS — K429 Umbilical hernia without obstruction or gangrene: Secondary | ICD-10-CM | POA: Diagnosis not present

## 2019-12-21 DIAGNOSIS — E278 Other specified disorders of adrenal gland: Secondary | ICD-10-CM | POA: Diagnosis not present

## 2019-12-21 DIAGNOSIS — I700 Atherosclerosis of aorta: Secondary | ICD-10-CM | POA: Diagnosis not present

## 2019-12-21 LAB — COMPREHENSIVE METABOLIC PANEL
ALT: 15 IU/L (ref 0–44)
AST: 16 IU/L (ref 0–40)
Albumin/Globulin Ratio: 2.9 — ABNORMAL HIGH (ref 1.2–2.2)
Albumin: 4.9 g/dL — ABNORMAL HIGH (ref 3.7–4.7)
Alkaline Phosphatase: 52 IU/L (ref 44–121)
BUN/Creatinine Ratio: 21 (ref 10–24)
BUN: 30 mg/dL — ABNORMAL HIGH (ref 8–27)
Bilirubin Total: 0.3 mg/dL (ref 0.0–1.2)
CO2: 23 mmol/L (ref 20–29)
Calcium: 10 mg/dL (ref 8.6–10.2)
Chloride: 99 mmol/L (ref 96–106)
Creatinine, Ser: 1.43 mg/dL — ABNORMAL HIGH (ref 0.76–1.27)
GFR calc Af Amer: 56 mL/min/{1.73_m2} — ABNORMAL LOW (ref >59)
GFR calc non Af Amer: 48 mL/min/{1.73_m2} — ABNORMAL LOW (ref >59)
Globulin, Total: 1.7 g/dL (ref 1.5–4.5)
Glucose: 98 mg/dL (ref 65–99)
Potassium: 4.5 mmol/L (ref 3.5–5.2)
Sodium: 135 mmol/L (ref 134–144)
Total Protein: 6.6 g/dL (ref 6.0–8.5)

## 2019-12-21 LAB — CBC WITH DIFFERENTIAL/PLATELET
Basophils Absolute: 0 10*3/uL (ref 0.0–0.2)
Basos: 0 %
EOS (ABSOLUTE): 0.1 10*3/uL (ref 0.0–0.4)
Eos: 1 %
Hematocrit: 42.9 % (ref 37.5–51.0)
Hemoglobin: 15.1 g/dL (ref 13.0–17.7)
Immature Grans (Abs): 0 10*3/uL (ref 0.0–0.1)
Immature Granulocytes: 0 %
Lymphocytes Absolute: 1.2 10*3/uL (ref 0.7–3.1)
Lymphs: 23 %
MCH: 34.6 pg — ABNORMAL HIGH (ref 26.6–33.0)
MCHC: 35.2 g/dL (ref 31.5–35.7)
MCV: 98 fL — ABNORMAL HIGH (ref 79–97)
Monocytes Absolute: 0.3 10*3/uL (ref 0.1–0.9)
Monocytes: 6 %
Neutrophils Absolute: 3.6 10*3/uL (ref 1.4–7.0)
Neutrophils: 70 %
Platelets: 152 10*3/uL (ref 150–450)
RBC: 4.36 x10E6/uL (ref 4.14–5.80)
RDW: 11.8 % (ref 11.6–15.4)
WBC: 5.2 10*3/uL (ref 3.4–10.8)

## 2019-12-21 MED ORDER — IOPAMIDOL (ISOVUE-300) INJECTION 61%
100.0000 mL | Freq: Once | INTRAVENOUS | Status: AC | PRN
  Administered 2019-12-21: 100 mL via INTRAVENOUS

## 2019-12-22 LAB — SPECIMEN STATUS REPORT

## 2019-12-22 LAB — PSA: Prostate Specific Ag, Serum: 1.3 ng/mL (ref 0.0–4.0)

## 2019-12-23 ENCOUNTER — Telehealth: Payer: Self-pay

## 2019-12-23 ENCOUNTER — Other Ambulatory Visit: Payer: Self-pay

## 2019-12-23 ENCOUNTER — Ambulatory Visit
Admission: RE | Admit: 2019-12-23 | Discharge: 2019-12-23 | Disposition: A | Discharge status: Home / Self Care | Payer: Medicare PPO | Source: Ambulatory Visit | Attending: Physical Medicine and Rehabilitation | Admitting: Physical Medicine and Rehabilitation

## 2019-12-23 DIAGNOSIS — R634 Abnormal weight loss: Secondary | ICD-10-CM

## 2019-12-23 DIAGNOSIS — M48061 Spinal stenosis, lumbar region without neurogenic claudication: Secondary | ICD-10-CM | POA: Diagnosis not present

## 2019-12-23 NOTE — Telephone Encounter (Signed)
Pt called about Cialis still not at pharmacy, was accidentally sent in as "sample"  I called pharmacy & corrected

## 2019-12-28 ENCOUNTER — Telehealth: Payer: Self-pay | Admitting: Physical Medicine and Rehabilitation

## 2019-12-28 NOTE — Telephone Encounter (Signed)
See previous message

## 2019-12-28 NOTE — Telephone Encounter (Signed)
Patient called to schedule with Dr. Newton.  

## 2019-12-28 NOTE — Telephone Encounter (Signed)
Left message #1 to schedule OV for MRI review. 

## 2019-12-28 NOTE — Telephone Encounter (Signed)
Scheduled for OV on 11/2.

## 2019-12-28 NOTE — Telephone Encounter (Signed)
-----   Message from Magnus Sinning, MD sent at 12/26/2019  8:30 AM EDT ----- Regarding: f/up MRI Needs f/up MRI, has mod/sever stenosis

## 2020-01-02 DIAGNOSIS — I10 Essential (primary) hypertension: Secondary | ICD-10-CM | POA: Diagnosis not present

## 2020-01-02 DIAGNOSIS — R634 Abnormal weight loss: Secondary | ICD-10-CM | POA: Diagnosis not present

## 2020-01-02 DIAGNOSIS — B20 Human immunodeficiency virus [HIV] disease: Secondary | ICD-10-CM | POA: Diagnosis not present

## 2020-01-02 DIAGNOSIS — L4052 Psoriatic arthritis mutilans: Secondary | ICD-10-CM | POA: Diagnosis not present

## 2020-01-10 ENCOUNTER — Other Ambulatory Visit: Payer: Self-pay

## 2020-01-10 ENCOUNTER — Encounter: Payer: Self-pay | Admitting: Physical Medicine and Rehabilitation

## 2020-01-10 ENCOUNTER — Telehealth: Payer: Self-pay | Admitting: Physical Medicine and Rehabilitation

## 2020-01-10 ENCOUNTER — Ambulatory Visit: Payer: Medicare PPO | Admitting: Physical Medicine and Rehabilitation

## 2020-01-10 VITALS — BP 115/67 | HR 69

## 2020-01-10 DIAGNOSIS — M48062 Spinal stenosis, lumbar region with neurogenic claudication: Secondary | ICD-10-CM

## 2020-01-10 DIAGNOSIS — M5136 Other intervertebral disc degeneration, lumbar region: Secondary | ICD-10-CM

## 2020-01-10 DIAGNOSIS — M5442 Lumbago with sciatica, left side: Secondary | ICD-10-CM

## 2020-01-10 DIAGNOSIS — M47816 Spondylosis without myelopathy or radiculopathy, lumbar region: Secondary | ICD-10-CM

## 2020-01-10 DIAGNOSIS — G8929 Other chronic pain: Secondary | ICD-10-CM | POA: Diagnosis not present

## 2020-01-10 DIAGNOSIS — M5441 Lumbago with sciatica, right side: Secondary | ICD-10-CM | POA: Diagnosis not present

## 2020-01-10 NOTE — Telephone Encounter (Signed)
Needs authorization for [id number]- bilateral L3 TF. Scheduled for 11/4.

## 2020-01-10 NOTE — Telephone Encounter (Signed)
Pt was approve Authorization #[id number]

## 2020-01-10 NOTE — Progress Notes (Signed)
  Numeric Pain Rating Scale and Functional Assessment Average Pain 5 Pain Right Now 4 My pain is constant, dull and aching Pain is worse with: standing and lifting Pain improves with: rest   In the last MONTH (on 0-10 scale) has pain interfered with the following?  1. General activity like being  able to carry out your everyday physical activities such as walking, climbing stairs, carrying groceries, or moving a chair?  Rating(3)  2. Relation with others like being able to carry out your usual social activities and roles such as  activities at home, at work and in your community. Rating(3)  3. Enjoyment of life such that you have  been bothered by emotional problems such as feeling anxious, depressed or irritable?  Rating(7)

## 2020-01-10 NOTE — Telephone Encounter (Signed)
Pt not req Auth#, TVN#[id number]

## 2020-01-11 ENCOUNTER — Encounter: Payer: Self-pay | Admitting: Physical Medicine and Rehabilitation

## 2020-01-11 DIAGNOSIS — M47816 Spondylosis without myelopathy or radiculopathy, lumbar region: Secondary | ICD-10-CM | POA: Insufficient documentation

## 2020-01-11 DIAGNOSIS — M48062 Spinal stenosis, lumbar region with neurogenic claudication: Secondary | ICD-10-CM | POA: Insufficient documentation

## 2020-01-11 NOTE — Progress Notes (Signed)
[name] - 74 y.o. male MRN [id number]  Date of birth: [birth year]  Office Visit Note: Visit Date: 01/10/2020 PCP: Denita Lung, MD Referred by: Denita Lung, MD  Subjective: Chief Complaint  Patient presents with  . Lower Back - Pain   HPI: [name] is a 74 y.o. male who comes in today For evaluation and management of severe recalcitrant low back pain and bilateral buttock and leg pain.  He reports average pain of 5 out of 10 constant dull and aching pain worse with standing and lifting better at rest.  His notes can be fully reviewed but we initially saw him last fall with back pain that was really annoyance with more worsening and just sort of exacerbation with increased activity and then it would calm down.  He has done well with diagnostic facet joint blocks and those did help quite a bit.  We have done those on 3 occasions.  The last ones did not help as much and he was getting more symptoms into the buttock and more of a claudication type symptomatology.  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  He does note today that he has lost some weight over the last several weeks and his primary care physician Dr. Redmond School has really worked up with imaging and lab test looking for possible cancer sources and he is pleased to report that really nothing has been found at this point.  He has increased some protein supplements and protein shakes and he has gained 4 pounds back.  MRI of the lumbar spine was performed and is shown to him today with spine models and imaging and the report is below.  He has not had any focal weakness no specific night pain no fevers chills or night sweats.  He continues to have pain into the buttocks and lower back.  Worse with standing and ambulating again.  Review of Systems  Musculoskeletal:  Positive for back pain and joint pain.  All other systems reviewed and are negative.  Otherwise per HPI.  Assessment & Plan: Visit Diagnoses:  1. Spondylosis without myelopathy or radiculopathy, lumbar region   2. Spinal stenosis of lumbar region with neurogenic claudication   3. Other intervertebral disc degeneration, lumbar region   4. Chronic bilateral low back pain with bilateral sciatica     Plan: Findings:  1.  Chronic worsening severe at times definitely limiting in terms of what he would like to do for any given time frame although he does stay active.  This is worsening despite injection treatment and exercise and therapy.  He continues to take ibuprofen as well as Tylenol.  He has had meloxicam which we tried in the past which did seem to help but he did not feel like it helped any more than the ibuprofen.  He has had no prior history of back surgery and now has MRI showing pretty significant stenosis at L3-4 severe and moderate at L4-5 without listhesis.  I do think the stenosis at L3 is likely causing the problems into the buttock and legs.  He does pretty well activity level anyway and he can continue to do exercise to activity tolerance.  We discussed at length surgical approaches injections etc.  I think this is finally declared itself as a stenosis  issue.  We will complete diagnostic and therapeutic L3 transforaminal injections bilaterally.    Meds & Orders: No orders of the defined types were placed in this encounter.  No orders of the defined types were placed in this encounter.   Follow-up: No follow-ups on file.   Procedures: No procedures performed  No notes on file   Clinical History: MRI LUMBAR SPINE WITHOUT CONTRAST  TECHNIQUE: Multiplanar, multisequence MR imaging of the lumbar spine was performed. No intravenous contrast was administered.  COMPARISON:  Plain films November 29, 2018.  FINDINGS: Segmentation:  Standard.  Alignment:  Minimal  retrolisthesis of L1 over L2.  Vertebrae:  No fracture, evidence of discitis, or bone lesion.  Conus medullaris and cauda equina: Conus extends to the T12-L1 level. Conus and cauda equina appear normal.  Paraspinal and other soft tissues: Negative.  Disc levels:  T12-L1: No spinal canal or neural foraminal stenosis.  L1-2: Loss of disc height, disc bulge and mild facet degenerative changes resulting in mild bilateral neural foraminal narrowing.  L2-3: Disc bulge, facet degenerative changes and ligamentum flavum redundancy resulting in mild spinal canal stenosis with narrowing of the bilateral subarticular zones and mild bilateral neural foraminal narrowing.  L3-4: Disc bulge, facet degenerative changes and ligamentum flavum redundancy superimposed on a congenitally small spinal canal, resulting in moderate to severe spinal canal stenosis, mild right and moderate left neural foraminal narrowing.  L4-5: Disc bulge with superimposed left subarticular disc protrusion, facet degenerative changes and ligamentum flavum redundancy resulting in moderate spinal canal stenosis with effacement of the left subarticular zone, mild right moderate left neural foraminal narrowing.  L5-S1: Loss of disc height, disc bulge with associated osteophytic component and superimposed small central disc protrusion, facet degenerative changes. No significant spinal canal or neural stenosis.  IMPRESSION: 1. Multilevel degenerative changes of the lumbar spine with moderate to severe spinal canal stenosis at L3-4 and moderate spinal canal stenosis at L4-5. 2. Moderate left neural foraminal narrowing at L3-4 and L4-5.   Electronically Signed   By: Pedro Earls M.D.   On: 12/23/2019 18:09 ---------- EXAM:  LUMBAR SPINE - COMPLETE 4+ VIEW    COMPARISON: None.    FINDINGS:  There are 5 non rib-bearing lumbar type vertebral bodies.    Normal alignment of the  lumbar spine. No anterolisthesis or  retrolisthesis    Lumbar vertebral body heights are preserved.    Mild to moderate multilevel lumbar spine DDD, worse at L1-L2 and  L5-S1 with disc space height loss, endplate irregularity and  sclerosis. There is partial ossification of the T12-L1  intervertebral disc.    Limited visualization of the bilateral SI joints and hips is normal.    Prominent phleboliths overlies the right hemipelvis. Atherosclerotic  plaque within a potentially mildly ectatic abdominal aorta measuring  2.7 cm in diameter. Vascular calcifications overlies expected  location of the splenic artery. Approximately 0.5 cm opacity  overlying the left renal fossa may represent a renal stone.    IMPRESSION:  1. Mild-to-moderate multilevel lumbar spine DDD, worse at L1-L2 and  L5-S1.  2. Atherosclerotic plaque within a potentially ectatic abdominal  aorta measuring approximately 2.7 cm in diameter. Further evaluation  with abdominal aortic ultrasound could be performed as clinically  indicated.  3. Potential left-sided nephrolithiasis. Clinical correlation is  advised. Further evaluation renal ultrasound could be performed as  indicated.      Electronically Signed  By: Sandi Mariscal M.D.  On: 11/30/2018 07:24   He reports that  he quit smoking about 9 years ago. His smoking use included cigarettes. He has never used smokeless tobacco. No results for input(s): HGBA1C, LABURIC in the last 8760 hours.  Objective:  VS:  HT:    WT:   BMI:     BP:115/67  HR:69bpm  TEMP: ( )  RESP:  Physical Exam Constitutional:      General: He is not in acute distress.    Appearance: Normal appearance. He is not ill-appearing.  HENT:     Head: Normocephalic and atraumatic.     Right Ear: External ear normal.     Left Ear: External ear normal.  Eyes:     Extraocular Movements: Extraocular movements intact.  Cardiovascular:     Rate and Rhythm: Normal rate.     Pulses:  Normal pulses.  Abdominal:     General: There is no distension.     Palpations: Abdomen is soft.  Musculoskeletal:        General: No tenderness or signs of injury.     Right lower leg: No edema.     Left lower leg: No edema.     Comments: Patient has good distal strength without clonus.Patient somewhat slow to rise from a seated position to full extension.  There is concordant low back pain with facet loading and lumbar spine extension rotation.  There are no definitive trigger points but the patient is somewhat tender across the lower back and PSIS.  There is no pain with hip rotation.   Skin:    Findings: No erythema or rash.  Neurological:     General: No focal deficit present.     Mental Status: He is alert and oriented to person, place, and time.     Sensory: No sensory deficit.     Motor: No weakness or abnormal muscle tone.     Coordination: Coordination normal.  Psychiatric:        Mood and Affect: Mood normal.        Behavior: Behavior normal.     Ortho Exam  Imaging: No results found.  Past Medical/Family/Surgical/Social History: Medications & Allergies reviewed per EMR, new medications updated. Patient Active Problem List   Diagnosis Date Noted  . Spondylosis without myelopathy or radiculopathy, lumbar region 01/11/2020  . Spinal stenosis of lumbar region with neurogenic claudication 01/11/2020  . Stage 3a chronic kidney disease ([place]) 09/06/2019  . Atherosclerosis of aorta ([place]) 11/30/2018  . Encounter for long-term (current) use of high-risk medication 03/18/2018  . Drug-induced erectile dysfunction 09/25/2015  . HIV positive ([place]) 08/21/2010  . Hyperlipidemia 08/21/2010  . Psoriatic arthritis ([place]) 08/21/2010  . ASHD (arteriosclerotic heart disease) 08/21/2010  . Herpes labialis 08/21/2010  . Allergic rhinitis due to pollen 08/21/2010  . GERD (gastroesophageal reflux disease) 08/21/2010  . Kidney stones 08/21/2010   Past Medical History:  Diagnosis Date    . Allergy    RHINITIS  . ASHD (arteriosclerotic heart disease)   . Former smoker QUIT 08/09/2010  . Herpes labialis   . HH (hiatus hernia)   . HIV infection ([place])    POSITIVE  . Hypertriglyceridemia   . Psoriasis    ARTHRITIS  . Renal stones   . Tinnitus    CHRONIC   History reviewed. No pertinent family history. Past Surgical History:  Procedure Laterality Date  . CORONARY ARTERY BYPASS GRAFT  2001   Social History   Occupational History  . Not on file  Tobacco Use  . Smoking status: Former Smoker  Types: Cigarettes    Quit date: 08/09/2010    Years since quitting: 9.4  . Smokeless tobacco: Never Used  Substance and Sexual Activity  . Alcohol use: Yes    Alcohol/week: 4.0 standard drinks    Types: 4 Glasses of wine per week  . Drug use: No  . Sexual activity: Yes

## 2020-01-12 ENCOUNTER — Other Ambulatory Visit: Payer: Self-pay

## 2020-01-12 ENCOUNTER — Encounter: Payer: Self-pay | Admitting: Physical Medicine and Rehabilitation

## 2020-01-12 ENCOUNTER — Ambulatory Visit: Payer: Self-pay

## 2020-01-12 ENCOUNTER — Ambulatory Visit: Payer: Medicare PPO | Admitting: Physical Medicine and Rehabilitation

## 2020-01-12 VITALS — BP 119/66 | HR 68

## 2020-01-12 DIAGNOSIS — M48062 Spinal stenosis, lumbar region with neurogenic claudication: Secondary | ICD-10-CM | POA: Diagnosis not present

## 2020-01-12 MED ORDER — DEXAMETHASONE SODIUM PHOSPHATE 10 MG/ML IJ SOLN
15.0000 mg | Freq: Once | INTRAMUSCULAR | Status: AC
  Administered 2020-01-12: 15 mg

## 2020-01-12 NOTE — Progress Notes (Signed)
Pt state lower back pain. Pt state walking and standing for a long period of time makes the pain worse. Pt state he takes pain meds to ease the pain. Pt has hx of inj on 11/30/19 Pt state it was great but the pain returned a month later.  Numeric Pain Rating Scale and Functional Assessment Average Pain 5   In the last MONTH (on 0-10 scale) has pain interfered with the following?  1. General activity like being  able to carry out your everyday physical activities such as walking, climbing stairs, carrying groceries, or moving a chair?  Rating(9)   +Driver, -BT, -Dye Allergies.

## 2020-01-16 ENCOUNTER — Other Ambulatory Visit: Payer: Self-pay | Admitting: Family Medicine

## 2020-01-16 DIAGNOSIS — Z21 Asymptomatic human immunodeficiency virus [HIV] infection status: Secondary | ICD-10-CM

## 2020-01-16 NOTE — Telephone Encounter (Signed)
[name] is requesting to fill pt biktarvy. Please advise [facility]

## 2020-01-20 DIAGNOSIS — L821 Other seborrheic keratosis: Secondary | ICD-10-CM | POA: Diagnosis not present

## 2020-01-20 DIAGNOSIS — L409 Psoriasis, unspecified: Secondary | ICD-10-CM | POA: Diagnosis not present

## 2020-01-20 DIAGNOSIS — L570 Actinic keratosis: Secondary | ICD-10-CM | POA: Diagnosis not present

## 2020-01-20 DIAGNOSIS — Z0189 Encounter for other specified special examinations: Secondary | ICD-10-CM | POA: Diagnosis not present

## 2020-01-20 DIAGNOSIS — L578 Other skin changes due to chronic exposure to nonionizing radiation: Secondary | ICD-10-CM | POA: Diagnosis not present

## 2020-01-20 DIAGNOSIS — Z85828 Personal history of other malignant neoplasm of skin: Secondary | ICD-10-CM | POA: Diagnosis not present

## 2020-01-20 DIAGNOSIS — Z5181 Encounter for therapeutic drug level monitoring: Secondary | ICD-10-CM | POA: Diagnosis not present

## 2020-01-20 DIAGNOSIS — D225 Melanocytic nevi of trunk: Secondary | ICD-10-CM | POA: Diagnosis not present

## 2020-01-30 ENCOUNTER — Other Ambulatory Visit: Payer: Self-pay | Admitting: Family Medicine

## 2020-01-30 DIAGNOSIS — N521 Erectile dysfunction due to diseases classified elsewhere: Secondary | ICD-10-CM

## 2020-01-30 NOTE — Telephone Encounter (Signed)
[facility] to [facility] tadalafil and belsomra. Please advise [facility]

## 2020-02-10 ENCOUNTER — Other Ambulatory Visit: Payer: Self-pay | Admitting: Family Medicine

## 2020-02-10 DIAGNOSIS — I10 Essential (primary) hypertension: Secondary | ICD-10-CM

## 2020-02-10 DIAGNOSIS — E785 Hyperlipidemia, unspecified: Secondary | ICD-10-CM

## 2020-03-07 ENCOUNTER — Telehealth: Payer: Self-pay | Admitting: Family Medicine

## 2020-03-07 MED ORDER — CELECOXIB 200 MG PO CAPS: 200.0000 mg | ORAL_CAPSULE | Freq: Two times a day (BID) | ORAL | 1 refills | Status: DC

## 2020-03-07 NOTE — Telephone Encounter (Signed)
Pt called and is requesting a refill on his celebrex please send to the [facility] - [place], [place] - [address]

## 2020-03-08 ENCOUNTER — Ambulatory Visit: Payer: Medicare PPO | Admitting: Family Medicine

## 2020-03-11 NOTE — Progress Notes (Signed)
[name] - 75 y.o. male MRN [id number]  Date of birth: 18-Oct-1945  Office Visit Note: Visit Date: 01/12/2020 PCP: Denita Lung, MD Referred by: Denita Lung, MD  Subjective: Chief Complaint  Patient presents with  . Lower Back - Pain   HPI:  [name] is a 75 y.o. male who comes in today  for planned Bilateral L3-L4 Lumbar epidural steroid injection with fluoroscopic guidance.  The patient has failed conservative care including home exercise, medications, time and activity modification.  This injection will be diagnostic and hopefully therapeutic.  Please see requesting physician notes for further details and justification.  MRI reviewed with images and spine model.  MRI reviewed in the note below.    ROS Otherwise per HPI.  Assessment & Plan: Visit Diagnoses:    ICD-10-CM   1. Spinal stenosis of lumbar region with neurogenic claudication  M48.062 XR C-ARM NO REPORT    Epidural Steroid injection    dexamethasone (DECADRON) injection 15 mg    Plan: No additional findings.   Meds & Orders:  Meds ordered this encounter  Medications  . dexamethasone (DECADRON) injection 15 mg    Orders Placed This Encounter  Procedures  . XR C-ARM NO REPORT  . Epidural Steroid injection    Follow-up: Return if symptoms worsen or fail to improve.   Procedures: No procedures performed  Lumbosacral Transforaminal Epidural Steroid Injection - Sub-Pedicular Approach with Fluoroscopic Guidance  Patient: [name]      Date of Birth: 22-Aug-1945 MRN: [id number] PCP: Denita Lung, MD      Visit Date: 01/12/2020   Universal Protocol:    Date/Time: 01/12/2020  Consent Given By: the patient  Position: PRONE  Additional Comments: Vital signs were monitored before and after the procedure. Patient was prepped and draped in the usual sterile fashion. The correct patient, procedure, and site was verified.   Injection Procedure Details:    Procedure diagnoses: Spinal stenosis of lumbar region with neurogenic claudication [M48.062]    Meds Administered:  Meds ordered this encounter  Medications  . dexamethasone (DECADRON) injection 15 mg    Laterality: Bilateral  Location/Site:  L3-L4  Needle:5.0 in., 22 ga.  Short bevel or Quincke spinal needle  Needle Placement: Transforaminal  Findings:    -Comments: Excellent flow of contrast along the nerve, nerve root and into the epidural space.  Procedure Details: After squaring off the end-plates to get a true AP view, the C-arm was positioned so that an oblique view of the foramen as noted above was visualized. The target area is just inferior to the "nose of the scotty dog" or sub pedicular. The soft tissues overlying this structure were infiltrated with 2-3 ml. of 1% Lidocaine without Epinephrine.  The spinal needle was inserted toward the target using a "trajectory" view along the fluoroscope beam.  Under AP and lateral visualization, the needle was advanced so it did not puncture dura and was located close the 6 O'Clock position of the pedical in AP tracterory. Biplanar projections were used to confirm position. Aspiration was confirmed to be negative for CSF and/or blood. A 1-2 ml. volume of Isovue-250 was injected and flow of contrast was noted at each level. Radiographs were obtained for documentation purposes.   After attaining the desired flow of contrast documented above, a 0.5 to 1.0 ml test dose of 0.25% Marcaine was injected into each respective transforaminal space.  The patient was observed for 90 seconds post injection.  After no sensory deficits were reported, and normal lower extremity motor function was noted,   the above injectate was administered so that equal amounts of the injectate were placed at each foramen (level) into the transforaminal epidural space.   Additional Comments:  The patient tolerated the procedure well Dressing: 2 x 2 sterile gauze  and Band-Aid    Post-procedure details: Patient was observed during the procedure. Post-procedure instructions were reviewed.  Patient left the clinic in stable condition.      Clinical History: MRI LUMBAR SPINE WITHOUT CONTRAST  TECHNIQUE: Multiplanar, multisequence MR imaging of the lumbar spine was performed. No intravenous contrast was administered.  COMPARISON:  Plain films November 29, 2018.  FINDINGS: Segmentation:  Standard.  Alignment:  Minimal retrolisthesis of L1 over L2.  Vertebrae:  No fracture, evidence of discitis, or bone lesion.  Conus medullaris and cauda equina: Conus extends to the T12-L1 level. Conus and cauda equina appear normal.  Paraspinal and other soft tissues: Negative.  Disc levels:  T12-L1: No spinal canal or neural foraminal stenosis.  L1-2: Loss of disc height, disc bulge and mild facet degenerative changes resulting in mild bilateral neural foraminal narrowing.  L2-3: Disc bulge, facet degenerative changes and ligamentum flavum redundancy resulting in mild spinal canal stenosis with narrowing of the bilateral subarticular zones and mild bilateral neural foraminal narrowing.  L3-4: Disc bulge, facet degenerative changes and ligamentum flavum redundancy superimposed on a congenitally small spinal canal, resulting in moderate to severe spinal canal stenosis, mild right and moderate left neural foraminal narrowing.  L4-5: Disc bulge with superimposed left subarticular disc protrusion, facet degenerative changes and ligamentum flavum redundancy resulting in moderate spinal canal stenosis with effacement of the left subarticular zone, mild right moderate left neural foraminal narrowing.  L5-S1: Loss of disc height, disc bulge with associated osteophytic component and superimposed small central disc protrusion, facet degenerative changes. No significant spinal canal or neural stenosis.  IMPRESSION: 1. Multilevel  degenerative changes of the lumbar spine with moderate to severe spinal canal stenosis at L3-4 and moderate spinal canal stenosis at L4-5. 2. Moderate left neural foraminal narrowing at L3-4 and L4-5.   Electronically Signed   By: Baldemar Lenis M.D.   On: 12/23/2019 18:09 ---------- EXAM:  LUMBAR SPINE - COMPLETE 4+ VIEW    COMPARISON: None.    FINDINGS:  There are 5 non rib-bearing lumbar type vertebral bodies.    Normal alignment of the lumbar spine. No anterolisthesis or  retrolisthesis    Lumbar vertebral body heights are preserved.    Mild to moderate multilevel lumbar spine DDD, worse at L1-L2 and  L5-S1 with disc space height loss, endplate irregularity and  sclerosis. There is partial ossification of the T12-L1  intervertebral disc.    Limited visualization of the bilateral SI joints and hips is normal.    Prominent phleboliths overlies the right hemipelvis. Atherosclerotic  plaque within a potentially mildly ectatic abdominal aorta measuring  2.7 cm in diameter. Vascular calcifications overlies expected  location of the splenic artery. Approximately 0.5 cm opacity  overlying the left renal fossa may represent a renal stone.    IMPRESSION:  1. Mild-to-moderate multilevel lumbar spine DDD, worse at L1-L2 and  L5-S1.  2. Atherosclerotic plaque within a potentially ectatic abdominal  aorta measuring approximately 2.7 cm in diameter. Further evaluation  with abdominal aortic ultrasound could be performed as clinically  indicated.  3. Potential left-sided nephrolithiasis. Clinical correlation is  advised. Further evaluation renal ultrasound could be  performed as  indicated.      Electronically Signed  By: Sandi Mariscal M.D.  On: 11/30/2018 07:24     Objective:  VS:  HT:    WT:   BMI:     BP:119/66  HR:68bpm  TEMP: ( )  RESP:  Physical Exam Constitutional:      General: He is not in acute distress.    Appearance: Normal  appearance. He is not ill-appearing.  HENT:     Head: Normocephalic and atraumatic.     Right Ear: External ear normal.     Left Ear: External ear normal.  Eyes:     Extraocular Movements: Extraocular movements intact.  Cardiovascular:     Rate and Rhythm: Normal rate.     Pulses: Normal pulses.  Abdominal:     General: There is no distension.     Palpations: Abdomen is soft.  Musculoskeletal:        General: No tenderness or signs of injury.     Right lower leg: No edema.     Left lower leg: No edema.     Comments: Patient has good distal strength without clonus.  Skin:    Findings: No erythema or rash.  Neurological:     General: No focal deficit present.     Mental Status: He is alert and oriented to person, place, and time.     Sensory: No sensory deficit.     Motor: No weakness or abnormal muscle tone.     Coordination: Coordination normal.  Psychiatric:        Mood and Affect: Mood normal.        Behavior: Behavior normal.      Imaging: No results found.

## 2020-03-11 NOTE — Procedures (Signed)
Lumbosacral Transforaminal Epidural Steroid Injection - Sub-Pedicular Approach with Fluoroscopic Guidance  Patient: [name]      Date of Birth: 1945/11/11 MRN: [id number] PCP: Ronnald Nian, MD      Visit Date: 01/12/2020   Universal Protocol:    Date/Time: 01/12/2020  Consent Given By: the patient  Position: PRONE  Additional Comments: Vital signs were monitored before and after the procedure. Patient was prepped and draped in the usual sterile fashion. The correct patient, procedure, and site was verified.   Injection Procedure Details:   Procedure diagnoses: Spinal stenosis of lumbar region with neurogenic claudication [M48.062]    Meds Administered:  Meds ordered this encounter  Medications  . dexamethasone (DECADRON) injection 15 mg    Laterality: Bilateral  Location/Site:  L3-L4  Needle:5.0 in., 22 ga.  Short bevel or Quincke spinal needle  Needle Placement: Transforaminal  Findings:    -Comments: Excellent flow of contrast along the nerve, nerve root and into the epidural space.  Procedure Details: After squaring off the end-plates to get a true AP view, the C-arm was positioned so that an oblique view of the foramen as noted above was visualized. The target area is just inferior to the "nose of the scotty dog" or sub pedicular. The soft tissues overlying this structure were infiltrated with 2-3 ml. of 1% Lidocaine without Epinephrine.  The spinal needle was inserted toward the target using a "trajectory" view along the fluoroscope beam.  Under AP and lateral visualization, the needle was advanced so it did not puncture dura and was located close the 6 O'Clock position of the pedical in AP tracterory. Biplanar projections were used to confirm position. Aspiration was confirmed to be negative for CSF and/or blood. A 1-2 ml. volume of Isovue-250 was injected and flow of contrast was noted at each level. Radiographs were obtained for documentation  purposes.   After attaining the desired flow of contrast documented above, a 0.5 to 1.0 ml test dose of 0.25% Marcaine was injected into each respective transforaminal space.  The patient was observed for 90 seconds post injection.  After no sensory deficits were reported, and normal lower extremity motor function was noted,   the above injectate was administered so that equal amounts of the injectate were placed at each foramen (level) into the transforaminal epidural space.   Additional Comments:  The patient tolerated the procedure well Dressing: 2 x 2 sterile gauze and Band-Aid    Post-procedure details: Patient was observed during the procedure. Post-procedure instructions were reviewed.  Patient left the clinic in stable condition.

## 2020-03-13 ENCOUNTER — Other Ambulatory Visit: Payer: Self-pay

## 2020-03-13 ENCOUNTER — Ambulatory Visit: Payer: Medicare PPO | Admitting: Family Medicine

## 2020-03-13 ENCOUNTER — Encounter: Payer: Self-pay | Admitting: Family Medicine

## 2020-03-13 VITALS — BP 110/70 | HR 69 | Temp 96.6°F | Ht 66.25 in | Wt 129.4 lb

## 2020-03-13 DIAGNOSIS — E785 Hyperlipidemia, unspecified: Secondary | ICD-10-CM

## 2020-03-13 DIAGNOSIS — L4050 Arthropathic psoriasis, unspecified: Secondary | ICD-10-CM

## 2020-03-13 DIAGNOSIS — I700 Atherosclerosis of aorta: Secondary | ICD-10-CM

## 2020-03-13 DIAGNOSIS — J301 Allergic rhinitis due to pollen: Secondary | ICD-10-CM | POA: Diagnosis not present

## 2020-03-13 DIAGNOSIS — Z21 Asymptomatic human immunodeficiency virus [HIV] infection status: Secondary | ICD-10-CM | POA: Diagnosis not present

## 2020-03-13 DIAGNOSIS — N200 Calculus of kidney: Secondary | ICD-10-CM | POA: Diagnosis not present

## 2020-03-13 DIAGNOSIS — N1831 Chronic kidney disease, stage 3a: Secondary | ICD-10-CM | POA: Diagnosis not present

## 2020-03-13 DIAGNOSIS — I2510 Atherosclerotic heart disease of native coronary artery without angina pectoris: Secondary | ICD-10-CM

## 2020-03-13 DIAGNOSIS — I77811 Abdominal aortic ectasia: Secondary | ICD-10-CM

## 2020-03-13 DIAGNOSIS — K219 Gastro-esophageal reflux disease without esophagitis: Secondary | ICD-10-CM

## 2020-03-13 DIAGNOSIS — N522 Drug-induced erectile dysfunction: Secondary | ICD-10-CM

## 2020-03-13 DIAGNOSIS — M48062 Spinal stenosis, lumbar region with neurogenic claudication: Secondary | ICD-10-CM

## 2020-03-13 DIAGNOSIS — Z79899 Other long term (current) drug therapy: Secondary | ICD-10-CM

## 2020-03-13 NOTE — Progress Notes (Signed)
[name] is a 75 y.o. male who presents for annual wellness visit and follow-up on chronic medical conditions.  He has a long history of dealing with psoriatic arthritis.  He was offered a DMARD by his rheumatologist but he is not interested in this because of his underlying HIV.  Presently he is back taking Celebrex which seems to help very well with his arthritic type symptoms.  He is aware of the potential complications with his kidneys and is willing to accept that.  He has been evaluated by Dr. Elnoria Howard for his weight loss at this point no other intervention is needed.  He continues on his HIV meds as well as statin drug.  His allergies seem to be under good control.  He does follow-up periodically with cardiology.  Reflux seems to be under good control.  He does occasionally use a medication for his underlying ADD.  He is being followed by [facility] and has had recent epidurals with some good results.  He does have stage III CKD.  Does have underlying aortic atherosclerosis as well as an ectatic abdominal aorta.  That will need to be followed up on a 5-year cycle.  He has no other concerns or complaints.   Immunizations and Health Maintenance Immunization History  Administered Date(s) Administered  . Fluad Quad(high Dose 65+) 11/29/2018, 12/20/2019  . Influenza Split 01/15/2011, 03/15/2012  . Influenza Whole 12/28/2006, 01/11/2009  . Influenza, High Dose Seasonal PF 02/10/2013, 03/29/2014, 03/21/2015, 04/03/2016, 03/24/2017  . PFIZER SARS-COV-2 Vaccination 04/17/2019, 05/11/2019, 12/20/2019  . Pneumococcal Conjugate-13 02/12/2005  . Pneumococcal Polysaccharide-23 03/24/2013  . Tdap 03/15/2012  . Zoster Recombinat (Shingrix) 01/09/2019   There are no preventive care reminders to display for this patient.  Last colonoscopy:  11/02/18 Last PSA: 12/20/19 Dentist: Q three months Ophtho: Q  year Exercise: PT 20 min about six days a week  Other doctors caring for patient include: Dr. Elnoria Howard  GI , Dr. Franchot Heidelberg  Advanced Directives: Does Patient Have a Medical Advance Directive?: No Would patient like information on creating a medical advance directive?: Yes (ED - Information included in AVS)  Depression screen:  See questionnaire below.     Depression screen [facility] 2/9 03/13/2020 12/20/2019 09/16/2018 09/29/2017 09/25/2016  Decreased Interest 0 0 0 0 0  Down, Depressed, Hopeless 0 0 0 0 0  PHQ - 2 Score 0 0 0 0 0    Fall Screen: See Questionaire below.   Fall Risk  12/20/2019 09/16/2018 09/29/2017 09/25/2016 09/25/2015  Falls in the past year? 0 0 No No No    ADL screen:  See questionnaire below.  Functional Status Survey: Is the patient deaf or have difficulty hearing?: No Does the patient have difficulty seeing, even when wearing glasses/contacts?: No Does the patient have difficulty concentrating, remembering, or making decisions?: No Does the patient have difficulty walking or climbing stairs?: No Does the patient have difficulty dressing or bathing?: No Does the patient have difficulty doing errands alone such as visiting a doctor's office or shopping?: No   Review of Systems  Constitutional: -, -unexpected weight change, -anorexia, -fatigue Allergy: -sneezing, -itching, -congestion Dermatology: denies changing moles, rash, lumps ENT: -runny nose, -ear pain, -sore throat,  Cardiology:  -chest pain, -palpitations, -orthopnea, Respiratory: -cough, -shortness of breath, -dyspnea on exertion, -wheezing,  Gastroenterology: -abdominal pain, -nausea, -vomiting, -diarrhea, -constipation, -dysphagia Hematology: -bleeding or bruising problems Musculoskeletal: -arthralgias, -myalgias, -joint swelling, -back pain, - Ophthalmology: -vision changes,  Urology: -dysuria, -difficulty urinating,  -urinary frequency, -urgency,  incontinence Neurology: -, -numbness, , -memory loss, -falls, -dizziness    PHYSICAL EXAM:  General Appearance: Alert, cooperative, no distress, appears  stated age Head: Normocephalic, without obvious abnormality, atraumatic Eyes: PERRL, conjunctiva/corneas clear, EOM's intact, fundi benign Ears: Normal TM's and external ear canals Nose: Nares normal, mucosa normal, no drainage or sinus   tenderness Throat: Lips, mucosa, and tongue normal; teeth and gums normal Neck: Supple, no lymphadenopathy, thyroid:no enlargement/tenderness/nodules; no carotid bruit or JVD Lungs: Clear to auscultation bilaterally without wheezes, rales or ronchi; respirations unlabored Heart: Regular rate and rhythm, S1 and S2 normal, no murmur, rub or gallop Abdomen: Soft, non-tender, nondistended, normoactive bowel sounds, no masses, no hepatosplenomegaly Extremities: No clubbing, cyanosis or edema Pulses: 2+ and symmetric all extremities Skin: Skin color, texture, turgor normal, no rashes or lesions Lymph nodes: Cervical, supraclavicular, and axillary nodes normal Neurologic: CNII-XII intact, normal strength, sensation and gait; reflexes 2+ and symmetric throughout   Psych: Normal mood, affect, hygiene and grooming  ASSESSMENT/PLAN: HIV positive ([place]) - Plan: HIV-1 RNA quant-no reflex-bld, T-helper cells (CD4) count (not at [facility])  Hyperlipidemia, unspecified hyperlipidemia type  Psoriatic arthritis ([place])  ASHD (arteriosclerotic heart disease)  Stage 3a chronic kidney disease ([place])  Seasonal allergic rhinitis due to pollen  Kidney stones  Gastroesophageal reflux disease without esophagitis  Drug-induced erectile dysfunction  Encounter for long-term (current) use of high-risk medication  Atherosclerosis of aorta (H[facility])  Spinal stenosis of lumbar region with neurogenic claudication  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  I will update concerning his HIV.  Immunization recommendations discussed.  Colonoscopy recommendations reviewed.   Medicare Attestation I have personally reviewed: The patient's  medical and social history Their use of alcohol, tobacco or illicit drugs Their current medications and supplements The patient's functional ability including ADLs,fall risks, home safety risks, cognitive, and hearing and visual impairment Diet and physical activities Evidence for depression or mood disorders  The patient's weight, height, and BMI have been recorded in the chart.  I have made referrals, counseling, and provided education to the patient based on review of the above and I have provided the patient with a written personalized care plan for preventive services.     Jill Alexanders, MD   03/13/2020

## 2020-03-15 ENCOUNTER — Other Ambulatory Visit: Payer: Self-pay | Admitting: Family Medicine

## 2020-03-15 DIAGNOSIS — N521 Erectile dysfunction due to diseases classified elsewhere: Secondary | ICD-10-CM

## 2020-03-15 LAB — T-HELPER CELLS (CD4) COUNT (NOT AT ARMC)
% CD 4 Pos. Lymph.: 41.6 % (ref 30.8–58.5)
Absolute CD 4 Helper: 416 /uL (ref 359–1519)
Basophils Absolute: 0 10*3/uL (ref 0.0–0.2)
Basos: 0 %
EOS (ABSOLUTE): 0 10*3/uL (ref 0.0–0.4)
Eos: 1 %
Hematocrit: 44.6 % (ref 37.5–51.0)
Hemoglobin: 15.8 g/dL (ref 13.0–17.7)
Immature Grans (Abs): 0 10*3/uL (ref 0.0–0.1)
Immature Granulocytes: 0 %
Lymphocytes Absolute: 1 10*3/uL (ref 0.7–3.1)
Lymphs: 17 %
MCH: 34 pg — ABNORMAL HIGH (ref 26.6–33.0)
MCHC: 35.4 g/dL (ref 31.5–35.7)
MCV: 96 fL (ref 79–97)
Monocytes Absolute: 0.3 10*3/uL (ref 0.1–0.9)
Monocytes: 6 %
Neutrophils Absolute: 4.6 10*3/uL (ref 1.4–7.0)
Neutrophils: 76 %
Platelets: 146 10*3/uL — ABNORMAL LOW (ref 150–450)
RBC: 4.65 x10E6/uL (ref 4.14–5.80)
RDW: 11.7 % (ref 11.6–15.4)
WBC: 6 10*3/uL (ref 3.4–10.8)

## 2020-03-15 LAB — HIV-1 RNA QUANT-NO REFLEX-BLD: HIV-1 RNA Viral Load: <20 copies/mL

## 2020-03-15 NOTE — Telephone Encounter (Signed)
Is this okay to refill? 

## 2020-03-21 DIAGNOSIS — R634 Abnormal weight loss: Secondary | ICD-10-CM | POA: Diagnosis not present

## 2020-03-21 DIAGNOSIS — B20 Human immunodeficiency virus [HIV] disease: Secondary | ICD-10-CM | POA: Diagnosis not present

## 2020-03-21 DIAGNOSIS — I10 Essential (primary) hypertension: Secondary | ICD-10-CM | POA: Diagnosis not present

## 2020-04-20 ENCOUNTER — Other Ambulatory Visit: Payer: Self-pay | Admitting: Family Medicine

## 2020-04-20 NOTE — Telephone Encounter (Signed)
[facility] to [facility] belsom[facility]. Please advise [facility]

## 2020-05-10 ENCOUNTER — Ambulatory Visit: Payer: Medicare PPO | Admitting: Family Medicine

## 2020-05-24 ENCOUNTER — Other Ambulatory Visit: Payer: Self-pay | Admitting: Family Medicine

## 2020-05-24 DIAGNOSIS — N521 Erectile dysfunction due to diseases classified elsewhere: Secondary | ICD-10-CM

## 2020-05-25 ENCOUNTER — Telehealth: Payer: Self-pay

## 2020-05-25 NOTE — Telephone Encounter (Signed)
P.A. TADALAFIL 

## 2020-06-04 NOTE — Telephone Encounter (Signed)
P.A. denied, Medicare does not pay for any ED meds. Looked up [facility] and cost at [facility].  Called pt and he wants to switch to [facility].  Cost $17 with [facility]. Switched there.

## 2020-06-26 ENCOUNTER — Other Ambulatory Visit: Payer: Self-pay | Admitting: Family Medicine

## 2020-06-26 NOTE — Telephone Encounter (Signed)
[name] is requesting  to fill pt celebrex. Please advise kh

## 2020-07-19 ENCOUNTER — Other Ambulatory Visit: Payer: Self-pay | Admitting: Family Medicine

## 2020-07-19 NOTE — Telephone Encounter (Signed)
Is this okay to refill? 

## 2020-08-07 ENCOUNTER — Telehealth: Payer: Self-pay

## 2020-08-07 NOTE — Telephone Encounter (Signed)
Patient called he is requesting a appointment with Dr.Newton regarding his back call [phone]

## 2020-08-07 NOTE — Telephone Encounter (Signed)
Bilateral L3 TF 01/12/2020. Ok to repeat if helped, same problem/side, and no new injury?

## 2020-08-07 NOTE — Telephone Encounter (Signed)
Ok

## 2020-08-08 NOTE — Telephone Encounter (Signed)
Patient reports that injection did not help (even for a short time) and he wants to discuss other options. Scheduled for office visit.

## 2020-08-14 ENCOUNTER — Encounter: Payer: Self-pay | Admitting: Physical Medicine and Rehabilitation

## 2020-08-14 ENCOUNTER — Other Ambulatory Visit: Payer: Self-pay

## 2020-08-14 ENCOUNTER — Ambulatory Visit (INDEPENDENT_AMBULATORY_CARE_PROVIDER_SITE_OTHER): Payer: Medicare PPO | Admitting: Physical Medicine and Rehabilitation

## 2020-08-14 ENCOUNTER — Telehealth: Payer: Self-pay | Admitting: Physical Medicine and Rehabilitation

## 2020-08-14 VITALS — BP 138/77 | HR 85

## 2020-08-14 DIAGNOSIS — M48062 Spinal stenosis, lumbar region with neurogenic claudication: Secondary | ICD-10-CM | POA: Diagnosis not present

## 2020-08-14 DIAGNOSIS — M47816 Spondylosis without myelopathy or radiculopathy, lumbar region: Secondary | ICD-10-CM

## 2020-08-14 DIAGNOSIS — M5441 Lumbago with sciatica, right side: Secondary | ICD-10-CM

## 2020-08-14 DIAGNOSIS — M5442 Lumbago with sciatica, left side: Secondary | ICD-10-CM

## 2020-08-14 DIAGNOSIS — G8929 Other chronic pain: Secondary | ICD-10-CM

## 2020-08-14 NOTE — Progress Notes (Signed)
[name] - 75 y.o. male MRN [id number]  Date of birth: 01-Nov-1945  Office Visit Note: Visit Date: 08/14/2020 PCP: Denita Lung, MD Referred by: Denita Lung, MD  Subjective: Chief Complaint  Patient presents with  . Lower Back - Pain   HPI: [name] is a 75 y.o. male who comes in today for evaluation of chronic, worsening, severe lower back pain radiating to bilateral buttock and legs. Pt reports 7 out of 10 sharp pain daily that worsens with ambulating, lifting and prolonged standing. Pt states pain is worse on right side. Pt reports some relief of pain with sitting, rest, stretching and medications. Pt has history of 2 prior facet joint blocks and an epidural steroid injection, the last being bilateral L3-L4 transforaminal with 50% relief for one week. Pt's MRI reviewed with spine models and imaging today. Pt reports his pain continues to cause him limitations in his daily routine and is open to trying another epidural steroid injection. Pt denies focal weakness, numbness, or tingling.     Review of Systems  Musculoskeletal: Positive for back pain and joint pain.  Neurological: Negative for tingling and focal weakness.  All other systems reviewed and are negative.  Otherwise per HPI.  Assessment & Plan: Visit Diagnoses:    ICD-10-CM   1. Spinal stenosis of lumbar region with neurogenic claudication  M48.062 Ambulatory referral to Neurosurgery  2. Spondylosis without myelopathy or radiculopathy, lumbar region  M47.816 Ambulatory referral to Neurosurgery  3. Chronic bilateral low back pain with bilateral sciatica  M54.42 Ambulatory referral to Neurosurgery   M54.41    G89.29      Plan: Findings:  Chronic, worsening, severe lower back pain radiating to bilateral buttocks and legs that continues to cause daily limitations for patient. Pt is staying active at home and has been compliant with stretching exercises and medication regimen, despite worsening  pain. Pt continues to take Tylenol and Celebrex at home, which provides some relief of pain. Pt has no prior history of back surgery and most recent MRI reveals moderate spinal stenosis at L3-L4 and L4-L5. Plan discussed at length with patient, including possible surgical interventions and in fact we did refer to Dr. Ronnald Ramp. We will complete diagnostic and therapeutic L4-L5 interlaminar epidural steroid injection.     Meds & Orders: No orders of the defined types were placed in this encounter.   Orders Placed This Encounter  Procedures  . Ambulatory referral to Neurosurgery    Follow-up: L4-L5 interlaminar epidural steroid injection  Procedures: No procedures performed      Clinical History: MRI LUMBAR SPINE WITHOUT CONTRAST  TECHNIQUE: Multiplanar, multisequence MR imaging of the lumbar spine was performed. No intravenous contrast was administered.  COMPARISON:  Plain films November 29, 2018.  FINDINGS: Segmentation:  Standard.  Alignment:  Minimal retrolisthesis of L1 over L2.  Vertebrae:  No fracture, evidence of discitis, or bone lesion.  Conus medullaris and cauda equina: Conus extends to the T12-L1 level. Conus and cauda equina appear normal.  Paraspinal and other soft tissues: Negative.  Disc levels:  T12-L1: No spinal canal or neural foraminal stenosis.  L1-2: Loss of disc height, disc bulge and mild facet degenerative changes resulting in mild bilateral neural foraminal narrowing.  L2-3: Disc bulge, facet degenerative changes and ligamentum flavum redundancy resulting in mild spinal canal stenosis with narrowing of the bilateral subarticular zones and mild bilateral neural foraminal narrowing.  L3-4: Disc bulge, facet degenerative changes and ligamentum flavum redundancy  superimposed on a congenitally small spinal canal, resulting in moderate to severe spinal canal stenosis, mild right and moderate left neural foraminal narrowing.  L4-5: Disc  bulge with superimposed left subarticular disc protrusion, facet degenerative changes and ligamentum flavum redundancy resulting in moderate spinal canal stenosis with effacement of the left subarticular zone, mild right moderate left neural foraminal narrowing.  L5-S1: Loss of disc height, disc bulge with associated osteophytic component and superimposed small central disc protrusion, facet degenerative changes. No significant spinal canal or neural stenosis.  IMPRESSION: 1. Multilevel degenerative changes of the lumbar spine with moderate to severe spinal canal stenosis at L3-4 and moderate spinal canal stenosis at L4-5. 2. Moderate left neural foraminal narrowing at L3-4 and L4-5.   Electronically Signed   By: Pedro Earls M.D.   On: 12/23/2019 18:09 ---------- EXAM:  LUMBAR SPINE - COMPLETE 4+ VIEW    COMPARISON: None.    FINDINGS:  There are 5 non rib-bearing lumbar type vertebral bodies.    Normal alignment of the lumbar spine. No anterolisthesis or  retrolisthesis    Lumbar vertebral body heights are preserved.    Mild to moderate multilevel lumbar spine DDD, worse at L1-L2 and  L5-S1 with disc space height loss, endplate irregularity and  sclerosis. There is partial ossification of the T12-L1  intervertebral disc.    Limited visualization of the bilateral SI joints and hips is normal.    Prominent phleboliths overlies the right hemipelvis. Atherosclerotic  plaque within a potentially mildly ectatic abdominal aorta measuring  2.7 cm in diameter. Vascular calcifications overlies expected  location of the splenic artery. Approximately 0.5 cm opacity  overlying the left renal fossa may represent a renal stone.    IMPRESSION:  1. Mild-to-moderate multilevel lumbar spine DDD, worse at L1-L2 and  L5-S1.  2. Atherosclerotic plaque within a potentially ectatic abdominal  aorta measuring approximately 2.7 cm in diameter. Further  evaluation  with abdominal aortic ultrasound could be performed as clinically  indicated.  3. Potential left-sided nephrolithiasis. Clinical correlation is  advised. Further evaluation renal ultrasound could be performed as  indicated.      Electronically Signed  By: Sandi Mariscal M.D.  On: 11/30/2018 07:24   He reports that he quit smoking about 10 years ago. His smoking use included cigarettes. He has never used smokeless tobacco. No results for input(s): HGBA1C, LABURIC in the last 8760 hours.  Objective:  VS:  HT:    WT:   BMI:     BP:138/77  HR:85bpm  TEMP: ( )  RESP:  Physical Exam HENT:     Head: Normocephalic.     Right Ear: External ear normal.     Left Ear: External ear normal.     Nose: Nose normal.  Eyes:     Pupils: Pupils are equal, round, and reactive to light.  Cardiovascular:     Rate and Rhythm: Normal rate.     Pulses: Normal pulses.  Pulmonary:     Effort: Pulmonary effort is normal.  Abdominal:     General: There is no distension.     Palpations: Abdomen is soft.  Musculoskeletal:     Cervical back: Normal range of motion.     Comments: Pt is slow to rise from seated position, no pain noted upon flexion and extension of lumbar spine, patient has strong distal strength without clonus.   Skin:    General: Skin is warm and dry.     Capillary Refill: Capillary refill takes  less than 2 seconds.  Neurological:     General: No focal deficit present.     Mental Status: He is alert.     Sensory: Sensation is intact. No sensory deficit.     Motor: Motor function is intact. No weakness.     Coordination: Coordination is intact.     Gait: Gait is intact.  Psychiatric:        Mood and Affect: Mood normal.     Imaging: No results found.  Past Medical/Family/Surgical/Social History: Medications & Allergies reviewed per EMR, new medications updated. Patient Active Problem List   Diagnosis Date Noted  . 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  Spondylosis without myelopathy or radiculopathy, lumbar region 01/11/2020  . Spinal stenosis of lumbar region with neurogenic claudication 01/11/2020  . Stage 3a chronic kidney disease ([place]) 09/06/2019  . Atherosclerosis of aorta ([place]) 11/30/2018  . Encounter for long-term (current) use of high-risk medication 03/18/2018  . Drug-induced erectile dysfunction 09/25/2015  . HIV positive ([place]) 08/21/2010  . Hyperlipidemia 08/21/2010  . Psoriatic arthritis ([place]) 08/21/2010  . ASHD (arteriosclerotic heart disease) 08/21/2010  . Herpes labialis 08/21/2010  . Allergic rhinitis due to pollen 08/21/2010  . GERD (gastroesophageal reflux disease) 08/21/2010  . Kidney stones 08/21/2010   Past Medical History:  Diagnosis Date  . Allergy    RHINITIS  . ASHD (arteriosclerotic heart disease)   . Former smoker QUIT 08/09/2010  . Herpes labialis   . HH (hiatus hernia)   . HIV infection ([place])    POSITIVE  . Hypertriglyceridemia   . Psoriasis    ARTHRITIS  . Renal stones   . Tinnitus    CHRONIC   History reviewed. No pertinent family history. Past Surgical History:  Procedure Laterality Date  . CORONARY ARTERY BYPASS GRAFT  2001   Social History   Occupational History  . Not on file  Tobacco Use  . Smoking status: Former Smoker    Types: Cigarettes    Quit date: 08/09/2010    Years since quitting: 10.0  . Smokeless tobacco: Never Used  Substance and Sexual Activity  . Alcohol use: Yes    Alcohol/week: 4.0 standard drinks    Types: 4 Glasses of wine per week  . Drug use: No  . Sexual activity: Yes

## 2020-08-14 NOTE — Telephone Encounter (Signed)
Needs auth for L4-5 [place]. Scheduled for 6/9 with driver and no blood thinners.

## 2020-08-14 NOTE — Progress Notes (Signed)
Bilateral L3 TF 01/12/2020. Patient states that relief was "minor." Bilateral buttock pain- right side worse. Feels like he is "leaning" and not able to stand up straight. Celebrex helps some- reports that he is taking 1500 mg twice per day.  Numeric Pain Rating Scale and Functional Assessment Average Pain 9 Pain Right Now 7 My pain is constant, dull and aching Pain is worse with: some activites Pain improves with: rest, therapy/exercise and medication   In the last MONTH (on 0-10 scale) has pain interfered with the following?  1. General activity like being  able to carry out your everyday physical activities such as walking, climbing stairs, carrying groceries, or moving a chair?  Rating(4)  2. Relation with others like being able to carry out your usual social activities and roles such as  activities at home, at work and in your community. Rating(4)  3. Enjoyment of life such that you have  been bothered by emotional problems such as feeling anxious, depressed or irritable?  Rating(3)

## 2020-08-16 ENCOUNTER — Encounter: Payer: Self-pay | Admitting: Physical Medicine and Rehabilitation

## 2020-08-16 ENCOUNTER — Other Ambulatory Visit: Payer: Self-pay

## 2020-08-16 ENCOUNTER — Ambulatory Visit (INDEPENDENT_AMBULATORY_CARE_PROVIDER_SITE_OTHER): Payer: Medicare PPO | Admitting: Physical Medicine and Rehabilitation

## 2020-08-16 ENCOUNTER — Ambulatory Visit: Payer: Self-pay

## 2020-08-16 VITALS — BP 123/67 | HR 76

## 2020-08-16 DIAGNOSIS — M48062 Spinal stenosis, lumbar region with neurogenic claudication: Secondary | ICD-10-CM | POA: Diagnosis not present

## 2020-08-16 MED ORDER — BETAMETHASONE SOD PHOS & ACET 6 (3-3) MG/ML IJ SUSP
12.0000 mg | Freq: Once | INTRAMUSCULAR | Status: AC
  Administered 2020-08-16: 12 mg

## 2020-08-16 NOTE — Progress Notes (Signed)
[name] - 75 y.o. male MRN [id number]  Date of birth: 10/10/1945  Office Visit Note: Visit Date: 08/16/2020 PCP: Denita Lung, MD Referred by: Denita Lung, MD  Subjective: Chief Complaint  Patient presents with   Lower Back - Pain   HPI:  [name] is a 75 y.o. male who comes in today at the request of Barnet Pall, FNP for planned Right L4-L5 Lumbar Interlaminar epidural steroid injection with fluoroscopic guidance.  The patient has failed conservative care including home exercise, medications, time and activity modification.  This injection will be diagnostic and hopefully therapeutic.  Please see requesting physician notes for further details and justification.   ROS Otherwise per HPI.  Assessment & Plan: Visit Diagnoses:    ICD-10-CM   1. Spinal stenosis of lumbar region with neurogenic claudication  M48.062 XR C-ARM NO REPORT    Epidural Steroid injection    betamethasone acetate-betamethasone sodium phosphate (CELESTONE) injection 12 mg      Plan: No additional findings.   Meds & Orders:  Meds ordered this encounter  Medications   betamethasone acetate-betamethasone sodium phosphate (CELESTONE) injection 12 mg    Orders Placed This Encounter  Procedures   XR C-ARM NO REPORT   Epidural Steroid injection    Follow-up: Return if symptoms worsen or fail to improve.   Procedures: No procedures performed  Lumbar Epidural Steroid Injection - Interlaminar Approach with Fluoroscopic Guidance  Patient: [name]      Date of Birth: 05/16/1945 MRN: [id number] PCP: Denita Lung, MD      Visit Date: 08/16/2020   Universal Protocol:     Consent Given By: the patient  Position: PRONE  Additional Comments: Vital signs were monitored before and after the procedure. Patient was prepped and draped in the usual sterile fashion. The correct patient, procedure, and site was verified.   Injection Procedure Details:    Procedure diagnoses: Spinal stenosis of lumbar region with neurogenic claudication [M48.062]   Meds Administered:  Meds ordered this encounter  Medications   betamethasone acetate-betamethasone sodium phosphate (CELESTONE) injection 12 mg     Laterality: Right  Location/Site:  L4-L5  Needle: 3.5 in., 20 ga. Tuohy  Needle Placement: Paramedian epidural  Findings:   -Comments: Excellent flow of contrast into the epidural space.  Procedure Details: Using a paramedian approach from the side mentioned above, the region overlying the inferior lamina was localized under fluoroscopic visualization and the soft tissues overlying this structure were infiltrated with 4 ml. of 1% Lidocaine without Epinephrine. The Tuohy needle was inserted into the epidural space using a paramedian approach.   The epidural space was localized using loss of resistance along with counter oblique bi-planar fluoroscopic views.  After negative aspirate for air, blood, and CSF, a 2 ml. volume of Isovue-250 was injected into the epidural space and the flow of contrast was observed. Radiographs were obtained for documentation purposes.    The injectate was administered into the level noted above.   Additional Comments:  The patient tolerated the procedure well Dressing: 2 x 2 sterile gauze and Band-Aid    Post-procedure details: Patient was observed during the procedure. Post-procedure instructions were reviewed.  Patient left the clinic in stable condition.   Clinical History: MRI LUMBAR SPINE WITHOUT CONTRAST   TECHNIQUE: Multiplanar, multisequence MR imaging of the lumbar spine was performed. No intravenous contrast was administered.   COMPARISON:  Plain films November 29, 2018.   FINDINGS: Segmentation:  Standard.   Alignment:  Minimal retrolisthesis of L1 over L2.   Vertebrae:  No fracture, evidence of discitis, or bone lesion.   Conus medullaris and cauda equina: Conus extends to the  T12-L1 level. Conus and cauda equina appear normal.   Paraspinal and other soft tissues: Negative.   Disc levels:   T12-L1: No spinal canal or neural foraminal stenosis.   L1-2: Loss of disc height, disc bulge and mild facet degenerative changes resulting in mild bilateral neural foraminal narrowing.   L2-3: Disc bulge, facet degenerative changes and ligamentum flavum redundancy resulting in mild spinal canal stenosis with narrowing of the bilateral subarticular zones and mild bilateral neural foraminal narrowing.   L3-4: Disc bulge, facet degenerative changes and ligamentum flavum redundancy superimposed on a congenitally small spinal canal, resulting in moderate to severe spinal canal stenosis, mild right and moderate left neural foraminal narrowing.   L4-5: Disc bulge with superimposed left subarticular disc protrusion, facet degenerative changes and ligamentum flavum redundancy resulting in moderate spinal canal stenosis with effacement of the left subarticular zone, mild right moderate left neural foraminal narrowing.   L5-S1: Loss of disc height, disc bulge with associated osteophytic component and superimposed small central disc protrusion, facet degenerative changes. No significant spinal canal or neural stenosis.   IMPRESSION: 1. Multilevel degenerative changes of the lumbar spine with moderate to severe spinal canal stenosis at L3-4 and moderate spinal canal stenosis at L4-5. 2. Moderate left neural foraminal narrowing at L3-4 and L4-5.     Electronically Signed   By: Pedro Earls M.D.   On: 12/23/2019 18:09 ---------- EXAM:  LUMBAR SPINE - COMPLETE 4+ VIEW     COMPARISON:  None.     FINDINGS:  There are 5 non rib-bearing lumbar type vertebral bodies.     Normal alignment of the lumbar spine. No anterolisthesis or  retrolisthesis     Lumbar vertebral body heights are preserved.     Mild to moderate multilevel lumbar spine DDD, worse  at L1-L2 and  L5-S1 with disc space height loss, endplate irregularity and  sclerosis. There is partial ossification of the T12-L1  intervertebral disc.     Limited visualization of the bilateral SI joints and hips is normal.     Prominent phleboliths overlies the right hemipelvis. Atherosclerotic  plaque within a potentially mildly ectatic abdominal aorta measuring  2.7 cm in diameter. Vascular calcifications overlies expected  location of the splenic artery. Approximately 0.5 cm opacity  overlying the left renal fossa may represent a renal stone.     IMPRESSION:  1. Mild-to-moderate multilevel lumbar spine DDD, worse at L1-L2 and  L5-S1.  2. Atherosclerotic plaque within a potentially ectatic abdominal  aorta measuring approximately 2.7 cm in diameter. Further evaluation  with abdominal aortic ultrasound could be performed as clinically  indicated.  3. Potential left-sided nephrolithiasis. Clinical correlation is  advised. Further evaluation renal ultrasound could be performed as  indicated.        Electronically Signed    By: Sandi Mariscal M.D.    On: 11/30/2018 07:24     Objective:  VS:  HT:    WT:   BMI:     BP:123/67  HR:76bpm  TEMP: ( )  RESP:  Physical Exam Vitals and nursing note reviewed.  Constitutional:      General: He is not in acute distress.    Appearance: Normal appearance. He is not ill-appearing.  HENT:     Head: Normocephalic and atraumatic.  Right Ear: External ear normal.     Left Ear: External ear normal.     Nose: No congestion.  Eyes:     Extraocular Movements: Extraocular movements intact.  Cardiovascular:     Rate and Rhythm: Normal rate.     Pulses: Normal pulses.  Pulmonary:     Effort: Pulmonary effort is normal. No respiratory distress.  Abdominal:     General: There is no distension.     Palpations: Abdomen is soft.  Musculoskeletal:        General: No tenderness or signs of injury.     Cervical back: Neck supple.     Right  lower leg: No edema.     Left lower leg: No edema.     Comments: Patient has good distal strength without clonus.  Skin:    Findings: No erythema or rash.  Neurological:     General: No focal deficit present.     Mental Status: He is alert and oriented to person, place, and time.     Sensory: No sensory deficit.     Motor: No weakness or abnormal muscle tone.     Coordination: Coordination normal.  Psychiatric:        Mood and Affect: Mood normal.        Behavior: Behavior normal.     Imaging: No results found.

## 2020-08-16 NOTE — Progress Notes (Signed)
Pt state lower back pain that travels down both positier of his thigh, mostly right. Pt state walking makes the pain worse.Pt state he takes pain meds to help ease his pain. Pt has hx 01/11/21 pt state it helped a little.  Numeric Pain Rating Scale and Functional Assessment Average Pain 7   In the last MONTH (on 0-10 scale) has pain interfered with the following?  1. General activity like being  able to carry out your everyday physical activities such as walking, climbing stairs, carrying groceries, or moving a chair?  Rating(8)   +Driver, -BT, -Dye Allergies.

## 2020-08-16 NOTE — Patient Instructions (Signed)

## 2020-08-21 ENCOUNTER — Other Ambulatory Visit: Payer: Self-pay | Admitting: Family Medicine

## 2020-08-21 DIAGNOSIS — N521 Erectile dysfunction due to diseases classified elsewhere: Secondary | ICD-10-CM

## 2020-08-21 NOTE — Procedures (Signed)
Lumbar Epidural Steroid Injection - Interlaminar Approach with Fluoroscopic Guidance  Patient: [name]      Date of Birth: 23-Apr-1945 MRN: [id number] PCP: Denita Lung, MD      Visit Date: 08/16/2020   Universal Protocol:     Consent Given By: the patient  Position: PRONE  Additional Comments: Vital signs were monitored before and after the procedure. Patient was prepped and draped in the usual sterile fashion. The correct patient, procedure, and site was verified.   Injection Procedure Details:   Procedure diagnoses: Spinal stenosis of lumbar region with neurogenic claudication [M48.062]   Meds Administered:  Meds ordered this encounter  Medications   betamethasone acetate-betamethasone sodium phosphate (CELESTONE) injection 12 mg     Laterality: Right  Location/Site:  L4-L5  Needle: 3.5 in., 20 ga. Tuohy  Needle Placement: Paramedian epidural  Findings:   -Comments: Excellent flow of contrast into the epidural space.  Procedure Details: Using a paramedian approach from the side mentioned above, the region overlying the inferior lamina was localized under fluoroscopic visualization and the soft tissues overlying this structure were infiltrated with 4 ml. of 1% Lidocaine without Epinephrine. The Tuohy needle was inserted into the epidural space using a paramedian approach.   The epidural space was localized using loss of resistance along with counter oblique bi-planar fluoroscopic views.  After negative aspirate for air, blood, and CSF, a 2 ml. volume of Isovue-250 was injected into the epidural space and the flow of contrast was observed. Radiographs were obtained for documentation purposes.    The injectate was administered into the level noted above.   Additional Comments:  The patient tolerated the procedure well Dressing: 2 x 2 sterile gauze and Band-Aid    Post-procedure details: Patient was observed during the procedure. Post-procedure  instructions were reviewed.  Patient left the clinic in stable condition.

## 2020-08-21 NOTE — Telephone Encounter (Signed)
[facility] is requesting to fill pt tadalafil. Please advise [facility]

## 2020-08-23 DIAGNOSIS — M48062 Spinal stenosis, lumbar region with neurogenic claudication: Secondary | ICD-10-CM | POA: Diagnosis not present

## 2020-08-27 ENCOUNTER — Other Ambulatory Visit: Payer: Self-pay | Admitting: Neurological Surgery

## 2020-08-27 DIAGNOSIS — M48061 Spinal stenosis, lumbar region without neurogenic claudication: Secondary | ICD-10-CM

## 2020-09-05 ENCOUNTER — Ambulatory Visit: Payer: Medicare PPO | Admitting: Family Medicine

## 2020-09-05 ENCOUNTER — Other Ambulatory Visit: Payer: Self-pay

## 2020-09-05 ENCOUNTER — Ambulatory Visit (INDEPENDENT_AMBULATORY_CARE_PROVIDER_SITE_OTHER): Payer: Medicare PPO | Admitting: Family Medicine

## 2020-09-05 VITALS — BP 120/62 | HR 63 | Temp 96.6°F | Resp 96 | Wt 125.4 lb

## 2020-09-05 DIAGNOSIS — E785 Hyperlipidemia, unspecified: Secondary | ICD-10-CM

## 2020-09-05 DIAGNOSIS — Z634 Disappearance and death of family member: Secondary | ICD-10-CM | POA: Diagnosis not present

## 2020-09-05 DIAGNOSIS — Z79899 Other long term (current) drug therapy: Secondary | ICD-10-CM

## 2020-09-05 DIAGNOSIS — Z21 Asymptomatic human immunodeficiency virus [HIV] infection status: Secondary | ICD-10-CM

## 2020-09-05 DIAGNOSIS — I2510 Atherosclerotic heart disease of native coronary artery without angina pectoris: Secondary | ICD-10-CM

## 2020-09-05 NOTE — Addendum Note (Signed)
Addended by: Denita Lung on: 09/05/2020 10:27 AM   Modules accepted: Level of Service

## 2020-09-05 NOTE — Progress Notes (Deleted)
   Subjective:    Patient ID: [name], male    DOB: 1946/02/17, 75 y.o.   MRN: [id number]  HPI    Review of Systems     Objective:   Physical Exam        Assessment & Plan:

## 2020-09-05 NOTE — Progress Notes (Signed)
  Subjective:    Patient ID: [name], male    DOB: [birth year], 75 y.o.   MRN: [id number]  Gradyn Shein is a 75 y.o. male

## 2020-09-05 NOTE — Progress Notes (Signed)
   Subjective:    Patient ID: [name], male    DOB: 12/31/1945, 75 y.o.   MRN: [id number]  HPI He is here for a med check appointment.He continues on [place] and is having no difficulty with that.  Celebrex continues to help with his psoriatic arthritis.  He continues on his blood pressure medications.  He has had no chest pain, shortness of breath, PND or DOE.  Presently he is dealing with the recent death of his brother and will be going to the funeral tomorrow.  This does happen quite stressed.  He recognizes that he is trying to control too much of the funeral and everything surrounding that.   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[place]) - Plan: CBC with Differential/Platelet, Comprehensive metabolic panel, Lipid panel, HIV-1 RNA quant-no reflex-bld, T-helper cells (CD4) count (not at [facility])  Hyperlipidemia, unspecified hyperlipidemia type  ASHD (arteriosclerotic heart disease) - Plan: CBC with Differential/Platelet, Comprehensive metabolic panel, Lipid panel  Family disruption due to death of family member  Encounter for long-term (current) use of high-risk medication - Plan: CBC with Differential/Platelet, Comprehensive metabolic panel, Lipid panel, HIV-1 RNA quant-no reflex-bld, T-helper cells (CD4) count (not at [facility]) He will continue on his present medication regimen. The majority of the time was spent discussing the death of his brother and his issues with control over the funeral as well as other family affairs.  Strongly encouraged him to recognize that he does have a problem with control and to let go of it except in regard to the family property and [name].  He understands this and plans to do that.

## 2020-09-06 LAB — COMPREHENSIVE METABOLIC PANEL
ALT: 14 IU/L (ref 0–44)
AST: 21 IU/L (ref 0–40)
Albumin/Globulin Ratio: 2.6 — ABNORMAL HIGH (ref 1.2–2.2)
Albumin: 4.9 g/dL — ABNORMAL HIGH (ref 3.7–4.7)
Alkaline Phosphatase: 64 IU/L (ref 44–121)
BUN/Creatinine Ratio: 16 (ref 10–24)
BUN: 22 mg/dL (ref 8–27)
Bilirubin Total: 0.4 mg/dL (ref 0.0–1.2)
CO2: 21 mmol/L (ref 20–29)
Calcium: 9.9 mg/dL (ref 8.6–10.2)
Chloride: 100 mmol/L (ref 96–106)
Creatinine, Ser: 1.36 mg/dL — ABNORMAL HIGH (ref 0.76–1.27)
Globulin, Total: 1.9 g/dL (ref 1.5–4.5)
Glucose: 93 mg/dL (ref 65–99)
Potassium: 4.2 mmol/L (ref 3.5–5.2)
Sodium: 136 mmol/L (ref 134–144)
Total Protein: 6.8 g/dL (ref 6.0–8.5)
eGFR: 55 mL/min/{1.73_m2} — ABNORMAL LOW (ref >59)

## 2020-09-06 LAB — T-HELPER CELLS (CD4) COUNT (NOT AT ARMC)
% CD 4 Pos. Lymph.: 40.4 % (ref 30.8–58.5)
Absolute CD 4 Helper: 323 /uL — ABNORMAL LOW (ref 359–1519)
Basophils Absolute: 0 10*3/uL (ref 0.0–0.2)
Basos: 0 %
EOS (ABSOLUTE): 0 10*3/uL (ref 0.0–0.4)
Eos: 1 %
Hematocrit: 42.5 % (ref 37.5–51.0)
Hemoglobin: 14.7 g/dL (ref 13.0–17.7)
Immature Grans (Abs): 0 10*3/uL (ref 0.0–0.1)
Immature Granulocytes: 0 %
Lymphocytes Absolute: 0.8 10*3/uL (ref 0.7–3.1)
Lymphs: 11 %
MCH: 33.5 pg — ABNORMAL HIGH (ref 26.6–33.0)
MCHC: 34.6 g/dL (ref 31.5–35.7)
MCV: 97 fL (ref 79–97)
Monocytes Absolute: 0.4 10*3/uL (ref 0.1–0.9)
Monocytes: 6 %
Neutrophils Absolute: 5.4 10*3/uL (ref 1.4–7.0)
Neutrophils: 82 %
Platelets: 136 10*3/uL — ABNORMAL LOW (ref 150–450)
RBC: 4.39 x10E6/uL (ref 4.14–5.80)
RDW: 12.8 % (ref 11.6–15.4)
WBC: 6.6 10*3/uL (ref 3.4–10.8)

## 2020-09-06 LAB — LIPID PANEL
Chol/HDL Ratio: 3.9 ratio (ref 0.0–5.0)
Cholesterol, Total: 133 mg/dL (ref 100–199)
HDL: 34 mg/dL — ABNORMAL LOW (ref >39)
LDL Chol Calc (NIH): 69 mg/dL (ref 0–99)
Triglycerides: 179 mg/dL — ABNORMAL HIGH (ref 0–149)
VLDL Cholesterol Cal: 30 mg/dL (ref 5–40)

## 2020-09-06 LAB — HIV-1 RNA QUANT-NO REFLEX-BLD: HIV-1 RNA Viral Load: <20 copies/mL

## 2020-09-07 ENCOUNTER — Encounter (HOSPITAL_COMMUNITY): Payer: Self-pay

## 2020-09-07 ENCOUNTER — Other Ambulatory Visit (HOSPITAL_COMMUNITY): Payer: Medicare PPO

## 2020-09-07 ENCOUNTER — Encounter (HOSPITAL_COMMUNITY)
Admission: RE | Admit: 2020-09-07 | Discharge: 2020-09-07 | Disposition: A | Discharge status: Home / Self Care | Payer: Medicare PPO | Source: Ambulatory Visit | Attending: Neurological Surgery | Admitting: Neurological Surgery

## 2020-09-07 ENCOUNTER — Other Ambulatory Visit: Payer: Self-pay

## 2020-09-07 DIAGNOSIS — Z01818 Encounter for other preprocedural examination: Secondary | ICD-10-CM | POA: Diagnosis not present

## 2020-09-07 DIAGNOSIS — M48061 Spinal stenosis, lumbar region without neurogenic claudication: Secondary | ICD-10-CM

## 2020-09-07 HISTORY — DX: Unspecified osteoarthritis, unspecified site: M19.90

## 2020-09-07 HISTORY — DX: Personal history of urinary calculi: Z87.442

## 2020-09-07 LAB — PROTIME-INR
INR: 1 (ref 0.8–1.2)
Prothrombin Time: 13.2 seconds (ref 11.4–15.2)

## 2020-09-07 LAB — SURGICAL PCR SCREEN
MRSA, PCR: NEGATIVE
Staphylococcus aureus: NEGATIVE

## 2020-09-07 NOTE — Progress Notes (Signed)
Surgical Instructions    Your procedure is scheduled on Wednesday, July 6th, 2022.  Report to [facility] Main Entrance "A" at 10:00 A.M., then check in with the Admitting office.  Call this number if you have problems the morning of surgery:  [phone]   If you have any questions prior to your surgery date call [phone]: Open Monday-Friday 8am-4pm    Remember:  Do not eat or drink after midnight the night before your surgery     Take these medicines the morning of surgery with A SIP OF WATER:  BIKTARVY rosuvastatin (CRESTOR)  If needed:  acetaminophen (TYLENOL)  nitroGLYCERIN (NITROSTAT) oxyCODONE-acetaminophen (PERCOCET/ROXICET) valACYclovir (VALTREX)   Ask your physician when to stop taking Celebrex.  As of today, STOP taking any Aspirin (unless otherwise instructed by your surgeon) Aleve, Naproxen, Ibuprofen, Motrin, Advil, Goody's, BC's, all herbal medications, fish oil, and all vitamins.          Do not wear jewelry  Do not wear lotions, powders,colognes, or deodorant.             Men may shave face and neck. Do not bring valuables to the hospital. DO Not wear nail polish, gel polish, artificial nails, or any other type of covering on natural nails including finger and toenails. If patients have artificial nails, gel coating, etc. that need to be removed by a nail salon please have this removed prior to surgery or surgery may need to be canceled/delayed if the surgeon/ anesthesia feels like the patient is unable to be adequately monitored.             [facility] is not responsible for any belongings or valuables.  Do NOT Smoke (Tobacco/Vaping) or drink Alcohol 24 hours prior to your procedure If you use a CPAP at night, you may bring all equipment for your overnight stay.   Contacts, glasses, dentures or bridgework may not be worn into surgery, please bring cases for these belongings   For patients admitted to the hospital, discharge time will be determined  by your treatment team.   Patients discharged the day of surgery will not be allowed to drive home, and someone needs to stay with them for 24 hours.  ONLY 1 SUPPORT PERSON MAY BE PRESENT WHILE YOU ARE IN SURGERY. IF YOU ARE TO BE ADMITTED ONCE YOU ARE IN YOUR ROOM YOU WILL BE ALLOWED TWO (2) VISITORS.  Minor children may have two parents present. Special consideration for safety and communication needs will be reviewed on a case by case basis.  Special instructions:    Oral Hygiene is also important to reduce your risk of infection.  Remember - BRUSH YOUR TEETH THE MORNING OF SURGERY WITH YOUR REGULAR TOOTHPASTE   [place]- Preparing For Surgery  Before surgery, you can play an important role. Because skin is not sterile, your skin needs to be as free of germs as possible. You can reduce the number of germs on your skin by washing with CHG (chlorahexidine gluconate) Soap before surgery.  CHG is an antiseptic cleaner which kills germs and bonds with the skin to continue killing germs even after washing.     Please do not use if you have an allergy to CHG or antibacterial soaps. If your skin becomes reddened/irritated stop using the CHG.  Do not shave (including legs and underarms) for at least 48 hours prior to first CHG shower. It is OK to shave your face.  Please follow these instructions carefully.  Shower the NIGHT BEFORE SURGERY and the MORNING OF SURGERY with CHG Soap.   If you chose to wash your hair, wash your hair first as usual with your normal shampoo. After you shampoo, rinse your hair and body thoroughly to remove the shampoo.  Then [employer] and genitals (private parts) with your normal soap and rinse thoroughly to remove soap.  After that Use CHG Soap as you would any other liquid soap. You can apply CHG directly to the skin and wash gently with a scrungie or a clean washcloth.   Apply the CHG Soap to your body ONLY FROM THE NECK DOWN.  Do not use on open wounds or  open sores. Avoid contact with your eyes, ears, mouth and genitals (private parts). Wash Face and genitals (private parts)  with your normal soap.   Wash thoroughly, paying special attention to the area where your surgery will be performed.  Thoroughly rinse your body with warm water from the neck down.  DO NOT shower/wash with your normal soap after using and rinsing off the CHG Soap.  Pat yourself dry with a CLEAN TOWEL.  Wear CLEAN PAJAMAS to bed the night before surgery  Place CLEAN SHEETS on your bed the night before your surgery  DO NOT SLEEP WITH PETS.   Day of Surgery:  Take a shower with CHG soap. Wear Clean/Comfortable clothing the morning of surgery Do not apply any deodorants/lotions.   Remember to brush your teeth WITH YOUR REGULAR TOOTHPASTE.   Please read over the following fact sheets that you were given.

## 2020-09-07 NOTE — Progress Notes (Signed)
Surgical Instructions    Your procedure is scheduled on Wednesday, July 6th, 2022.  Report to [facility] Main Entrance "A" at 10:00 A.M., then check in with the Admitting office.  Call this number if you have problems the morning of surgery:  [phone]   If you have any questions prior to your surgery date call [phone]: Open Monday-Friday 8am-4pm    Remember:  Do not eat or drink after midnight the night before your surgery     Take these medicines the morning of surgery with A SIP OF WATER:  BIKTARVY rosuvastatin (CRESTOR)  If needed:  acetaminophen (TYLENOL)  nitroGLYCERIN (NITROSTAT) oxyCODONE-acetaminophen (PERCOCET/ROXICET) valACYclovir (VALTREX)   Ask your physician when to stop taking Celebrex.  As of today, STOP taking any Aspirin (unless otherwise instructed by your surgeon) Aleve, Naproxen, Ibuprofen, Motrin, Advil, Goody's, BC's, all herbal medications, fish oil, and all vitamins.          Do not wear jewelry  Do not wear lotions, powders,colognes, or deodorant.             Men may shave face and neck. Do not bring valuables to the hospital.              [facility] is not responsible for any belongings or valuables.  Do NOT Smoke (Tobacco/Vaping) or drink Alcohol 24 hours prior to your procedure If you use a CPAP at night, you may bring all equipment for your overnight stay.   Contacts, glasses, dentures or bridgework may not be worn into surgery, please bring cases for these belongings   For patients admitted to the hospital, discharge time will be determined by your treatment team.   Patients discharged the day of surgery will not be allowed to drive home, and someone needs to stay with them for 24 hours.  ONLY 1 SUPPORT PERSON MAY BE PRESENT WHILE YOU ARE IN SURGERY. IF YOU ARE TO BE ADMITTED ONCE YOU ARE IN YOUR ROOM YOU WILL BE ALLOWED TWO (2) VISITORS.  Minor children may have two parents present. Special consideration for safety and  communication needs will be reviewed on a case by case basis.  Special instructions:    Oral Hygiene is also important to reduce your risk of infection.  Remember - BRUSH YOUR TEETH THE MORNING OF SURGERY WITH YOUR REGULAR TOOTHPASTE   [place]- Preparing For Surgery  Before surgery, you can play an important role. Because skin is not sterile, your skin needs to be as free of germs as possible. You can reduce the number of germs on your skin by washing with CHG (chlorahexidine gluconate) Soap before surgery.  CHG is an antiseptic cleaner which kills germs and bonds with the skin to continue killing germs even after washing.     Please do not use if you have an allergy to CHG or antibacterial soaps. If your skin becomes reddened/irritated stop using the CHG.  Do not shave (including legs and underarms) for at least 48 hours prior to first CHG shower. It is OK to shave your face.  Please follow these instructions carefully.     Shower the NIGHT BEFORE SURGERY and the MORNING OF SURGERY with CHG Soap.   If you chose to wash your hair, wash your hair first as usual with your normal shampoo. After you shampoo, rinse your hair and body thoroughly to remove the shampoo.  Then [employer] and genitals (private parts) with your normal soap and rinse thoroughly to remove soap.  After that  Use CHG Soap as you would any other liquid soap. You can apply CHG directly to the skin and wash gently with a scrungie or a clean washcloth.   Apply the CHG Soap to your body ONLY FROM THE NECK DOWN.  Do not use on open wounds or open sores. Avoid contact with your eyes, ears, mouth and genitals (private parts). Wash Face and genitals (private parts)  with your normal soap.   Wash thoroughly, paying special attention to the area where your surgery will be performed.  Thoroughly rinse your body with warm water from the neck down.  DO NOT shower/wash with your normal soap after using and rinsing off the CHG  Soap.  Pat yourself dry with a CLEAN TOWEL.  Wear CLEAN PAJAMAS to bed the night before surgery  Place CLEAN SHEETS on your bed the night before your surgery  DO NOT SLEEP WITH PETS.   Day of Surgery:  Take a shower with CHG soap. Wear Clean/Comfortable clothing the morning of surgery Do not apply any deodorants/lotions.   Remember to brush your teeth WITH YOUR REGULAR TOOTHPASTE.   Please read over the following fact sheets that you were given.

## 2020-09-07 NOTE — Progress Notes (Signed)
The blood work is normal 
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
## 2020-09-07 NOTE — Progress Notes (Signed)
PCP - Jill Alexanders Cardiologist - hasn't followed up in 4 years, per patient  Chest x-ray - 09/07/20 EKG - 09/07/20   COVID TEST- Tuesday 09/11/20   Anesthesia review: yes, heart history Ebony Hail is aware and spoke with patient at PAT appointment.  Had had CABG (2001), last follow up was 4 years ago.  Does not currently have cardiologist.  Ebony Hail to follow up.   Patient had scratchy, sore throat at PAT appointment.  Patient advised to take at-home covid test.  Patient denies shortness of breath, fever, cough and chest pain at PAT appointment   All instructions explained to the patient, with a verbal understanding of the material. Patient agrees to go over the instructions while at home for a better understanding. Patient also instructed to self quarantine after being tested for COVID-19. The opportunity to ask questions was provided.

## 2020-09-11 ENCOUNTER — Telehealth: Payer: Self-pay | Admitting: *Deleted

## 2020-09-11 ENCOUNTER — Other Ambulatory Visit (HOSPITAL_COMMUNITY)
Admission: RE | Admit: 2020-09-11 | Discharge: 2020-09-11 | Disposition: A | Discharge status: Home / Self Care | Payer: Medicare PPO | Source: Ambulatory Visit | Attending: Neurological Surgery | Admitting: Neurological Surgery

## 2020-09-11 DIAGNOSIS — Z01812 Encounter for preprocedural laboratory examination: Secondary | ICD-10-CM | POA: Diagnosis not present

## 2020-09-11 DIAGNOSIS — Z20822 Contact with and (suspected) exposure to covid-19: Secondary | ICD-10-CM | POA: Insufficient documentation

## 2020-09-11 NOTE — Telephone Encounter (Signed)
   [facility] Pre-operative Risk Assessment    Patient Name: [name]  DOB: [birth year]  MRN: [id number]   HEARTCARE STAFF: - Please ensure there is not already an duplicate clearance open for this procedure. - Under Visit Info/Reason for Call, type in Other and utilize the format Clearance MM/DD/YY or Clearance TBD. Do not use dashes or single digits. - If request is for dental extraction, please clarify the # of teeth to be extracted. - If the patient is currently at the dentist's office, call Pre-Op APP to address. If the patient is not currently in the dentist office, please route to the Pre-Op pool  Request for surgical clearance: PT IS BEING REFERRED TO CARDIOLOGY. WILL HAND OVER NOTES FAXED TO OUR OFFICE TO OUR CHART PREP TEAM. I S/W THE PT AND SCHEDULED HIM FOR A NEW PT APPT PER REFERRAL FROM DR. DAVID JONES OFFICE. DR. [name] 09/25/20 @ 2:40 NEW PT APPT FOR PRE OP. PT STATES TO ME THAT HIS SURGERY IS TOMORROW AND THAT THE SURGERY SCHEDULER ADVISED HIM TO STILL COME TOMORROW FOR HIS SURGERY. I INFORMED THE PT THAT DR. Ronnald Ramp I REFERRING HIM TO CARDIOLOGY AS A NEW PT AND THAT HE IS GOING TO NEED AN APP, WHICH SEE ABOVE APPT 09/25/20. PT STATES HE IS GOING TO CALL DR. Ronnald Ramp OFFICE AND S/W SURGERY SCHEDULER BECAUSE HE DOES NOT WANT TO GO TO [place] TOMORROW IF HIS SURGERY WILL NOT BE DONE TOMORROW. I ASSURED THE PT THAT I WILL SEND NOTES TO DR. DAVID JONES AS WELL. PT THANKED ME FOR THE HELP.   What type of surgery is being performed? DECOMPRESSIVE LUMBAR HEMILAMINECTOMY MEDIAL FACETECTOMY FORAMINOTOMY AT L3-4, L4-5 ON THE LEFT WITH DISKECTOMY AT L4-5 ON THE LEFT  AND SUBLAMINAR DECOMPRESSION L3-4 FOR CENTRAL STENOSIS  When is this surgery scheduled? TBD (ASAP)   What type of clearance is required (medical clearance vs. Pharmacy clearance to hold med vs. Both)? MEDICAL  Are there any medications that need to be held prior to surgery and how long? NONE  LISTED  Practice name and name of physician performing surgery? [facility]; DR. Sherley Bounds  What is the office phone number? [phone]   7.   What is the office fax number? [phone]  8.   Anesthesia type (None, local, MAC, general) ? GENERAL   Julaine Hua 09/11/2020, 12:55 PM  _________________________________________________________________   (provider comments below)

## 2020-09-11 NOTE — Progress Notes (Signed)
Spoke directly with patient at 66. Voiced understanding of new surgery time of 1418 and new arrival time of 1215.

## 2020-09-11 NOTE — Anesthesia Preprocedure Evaluation (Addendum)
Anesthesia Evaluation  Patient identified by MRN, date of birth, ID band Patient awake    Reviewed: Allergy & Precautions, NPO status , Patient's Chart, lab work & pertinent test results  Airway Mallampati: II  TM Distance: >3 FB Neck ROM: Full    Dental  (+) Teeth Intact   Pulmonary neg pulmonary ROS, Current Smoker,    Pulmonary exam normal        Cardiovascular hypertension, Pt. on medications + CAD and + CABG (2000)   Rhythm:Regular Rate:Normal     Neuro/Psych negative neurological ROS  negative psych ROS   GI/Hepatic Neg liver ROS, hiatal hernia, GERD  ,  Endo/Other  negative endocrine ROS  Renal/GU negative Renal ROS  negative genitourinary   Musculoskeletal  (+) Arthritis , Osteoarthritis,    Abdominal (+)  Abdomen: soft. Bowel sounds: normal.  Peds  Hematology negative hematology ROS (+) HIV,   Anesthesia Other Findings   Reproductive/Obstetrics                            Anesthesia Physical Anesthesia Plan  ASA: 3  Anesthesia Plan: General   Post-op Pain Management:    Induction: Intravenous  PONV Risk Score and Plan: 1 and Ondansetron, Dexamethasone and Treatment may vary due to age or medical condition  Airway Management Planned: Mask and Oral ETT  Additional Equipment: None  Intra-op Plan:   Post-operative Plan: Extubation in OR  Informed Consent: I have reviewed the patients History and Physical, chart, labs and discussed the procedure including the risks, benefits and alternatives for the proposed anesthesia with the patient or authorized representative who has indicated his/her understanding and acceptance.     Dental advisory given  Plan Discussed with: CRNA  Anesthesia Plan Comments: (PAT note written by Myra Gianotti, PA-C. Lab Results      Component                Value               Date                      WBC                      6.1                  10/01/2020                HGB                      14.4                10/01/2020                HCT                      41.7                10/01/2020                MCV                      98.3                10/01/2020                PLT  157                 10/01/2020           Lab Results      Component                Value               Date                      NA                       136                 09/05/2020                K                        4.2                 09/05/2020                CO2                      21                  09/05/2020                GLUCOSE                  93                  09/05/2020                BUN                      22                  09/05/2020                CREATININE               1.36 (H)            09/05/2020                CALCIUM                  9.9                 09/05/2020                GFRNONAA                 48 (L)              12/20/2019                GFRAA                    56 (L)              12/20/2019          )       Anesthesia Quick Evaluation

## 2020-09-12 ENCOUNTER — Encounter (HOSPITAL_COMMUNITY): Payer: Self-pay

## 2020-09-12 LAB — SARS CORONAVIRUS 2 (TAT 6-24 HRS): SARS Coronavirus 2: NEGATIVE

## 2020-09-13 ENCOUNTER — Inpatient Hospital Stay (HOSPITAL_COMMUNITY): Admission: RE | Admit: 2020-09-13 | Payer: Medicare PPO | Source: Ambulatory Visit

## 2020-09-18 ENCOUNTER — Other Ambulatory Visit: Payer: Self-pay | Admitting: Family Medicine

## 2020-09-18 NOTE — Telephone Encounter (Signed)
[facility] is requesting to fill pt belsomra. Please advise [facility]

## 2020-09-24 NOTE — Progress Notes (Signed)
Cardiology Office Note:    Date:  09/25/2020   ID:  [name], DOB [birth year], MRN [id number]  PCP:  Denita Lung, MD   [facility] Providers Cardiologist:  previouis Janene Madeira, now Nour Scalise  Click to update primary MD,subspecialty MD or APP then REFRESH:1}    Referring MD: Denita Lung, MD   Chief Complaint  Patient presents with   Hypertension   Coronary Artery Disease    September 24, 2020   [name] is a 75 y.o. male with a hx of HIV, psoriatic arthritis ,  HTN , CABG    We are asked to see him for pre op evaluation by Dr. Ronnald Ramp and Dr. Redmond School for pre op evaluation for his HTN piror to  back surgery   Has known HTN.  Is on Losartan- HCTZ Typically walks regularly . Now has spinal stenosis and is very limited   Previoius patient of Janene Madeira CABG in 2000 .   No recurrent symptoms  Has a family hx of CAD.      Past Medical History:  Diagnosis Date   Allergy    RHINITIS   Arthritis    ASHD (arteriosclerotic heart disease)    Former smoker QUIT 08/09/2010   Herpes labialis    HH (hiatus hernia)    History of kidney stones    HIV infection ([place])    POSITIVE   Hypertriglyceridemia    Psoriasis    ARTHRITIS   Renal stones    Tinnitus    CHRONIC    Past Surgical History:  Procedure Laterality Date   CORONARY ARTERY BYPASS GRAFT  08/20/1998   CABG x4: LIMA-LAD, SVG-Intermediate, SVG-distal CX, SVG-PDA    Current Medications: Current Meds  Medication Sig   acetaminophen (TYLENOL) 500 MG tablet Take 1,000 mg by mouth every 8 (eight) hours as needed for moderate pain.   BELSOMRA 10 MG TABS TAKE 1 TABLET BY MOUTH EVERY DAY AT BEDTIME AS NEEDED   BIKTARVY 50-200-25 MG TABS tablet TAKE 1 TABLET BY MOUTH DAILY   celecoxib (CELEBREX) 200 MG capsule TAKE 1 CAPSULE TWICE DAILY (Patient taking differently: Take 200 mg by mouth 2 (two) times daily.)   losartan-hydrochlorothiazide (HYZAAR) 50-12.5 MG tablet TAKE 1 TABLET EVERY DAY  (Patient taking differently: Take 1 tablet by mouth daily.)   Multiple Vitamins-Minerals (MULTIVITAMIN WITH MINERALS) tablet Take 1 tablet by mouth daily.   nitroGLYCERIN (NITROSTAT) 0.3 MG SL tablet Place 1 tablet (0.3 mg total) under the tongue every 5 (five) minutes as needed for chest pain.   oxyCODONE-acetaminophen (PERCOCET/ROXICET) 5-325 MG tablet Take 1 tablet by mouth every 6 (six) hours as needed for severe pain.   rosuvastatin (CRESTOR) 20 MG tablet TAKE 1 TABLET EVERY DAY (Patient taking differently: Take 20 mg by mouth daily.)   tadalafil (CIALIS) 5 MG tablet TAKE 1 TABLET BY MOUTH EVERY DAY AS NEEDED (Patient taking differently: Take 5 mg by mouth daily as needed for erectile dysfunction.)   valACYclovir (VALTREX) 1000 MG tablet 2 tablets twice a day for 1 day (Patient taking differently: Take 1,000 mg by mouth daily as needed (outbreaks).)     Allergies:   Patient has no known allergies.   Social History   Socioeconomic History   Marital status: Single    Spouse name: Not on file   Number of children: Not on file   Years of education: Not on file   Highest education level: Not on file  Occupational History  Not on file  Tobacco Use   Smoking status: Former    Packs/day: 0.00    Types: Cigarettes    Quit date: 08/09/2010    Years since quitting: 10.1   Smokeless tobacco: Never  Substance and Sexual Activity   Alcohol use: Yes    Alcohol/week: 4.0 standard drinks    Types: 4 Glasses of wine per week    Comment: occasional   Drug use: No   Sexual activity: Yes  Other Topics Concern   Not on file  Social History Narrative   Not on file   Social Determinants of Health   Financial Resource Strain: Not on file  Food Insecurity: Not on file  Transportation Needs: Not on file  Physical Activity: Not on file  Stress: Not on file  Social Connections: Not on file     Family History: The patient's family history includes CAD in his brother; Coronary artery disease  in his father and mother.  ROS:   Please see the history of present illness.     All other systems reviewed and are negative.  EKGs/Labs/Other Studies Reviewed:    The following studies were reviewed today:   EKG: September 07, 2020: Normal sinus rhythm no ST or T wave changes.  Recent Labs: 09/05/2020: ALT 14; BUN 22; Creatinine, Ser 1.36; Hemoglobin 14.7; Platelets 136; Potassium 4.2; Sodium 136  Recent Lipid Panel    Component Value Date/Time   CHOL 133 09/05/2020 1026   TRIG 179 (H) 09/05/2020 1026   HDL 34 (L) 09/05/2020 1026   CHOLHDL 3.9 09/05/2020 1026   CHOLHDL 5.3 (H) 09/25/2016 0753   VLDL 40 (H) 09/25/2016 0753   LDLCALC 69 09/05/2020 1026     Risk Assessment/Calculations:           Physical Exam:    VS:  BP 124/72   Pulse 84   Ht 5\' 8"  (1.727 m)   Wt 123 lb 6.4 oz (56 kg)   SpO2 93%   BMI 18.76 kg/m     Wt Readings from Last 3 Encounters:  09/25/20 123 lb 6.4 oz (56 kg)  09/07/20 123 lb (55.8 kg)  09/05/20 125 lb 6.4 oz (56.9 kg)     GEN:  Well nourished, well developed in no acute distress HEENT: Normal NECK: No JVD;   soft left carotid bruit  LYMPHATICS: No lymphadenopathy CARDIAC: RRR, no murmurs, rubs, gallops RESPIRATORY:  Clear to auscultation without rales, wheezing or rhonchi  ABDOMEN: Soft, non-tender, non-distended MUSCULOSKELETAL:  No edema; No deformity  SKIN: Warm and dry NEUROLOGIC:  Alert and oriented x 3 PSYCHIATRIC:  Normal affect   ASSESSMENT:    1. Bruit    PLAN:      CAD :   s/p CABG,  no angina .   Seems to be doing well  He is at low risk for his back surgery  BP is normal in the office    2.  Hyperlipidemia:    trigs are elevated.   Has a sweet tooth.  Cont   3.  Left carotid bruit:   has a soft left carotid bruit. Will get a carotid duplex scan in about 3-4 months ( after he has recovered from his back surgery )    Medication Adjustments/Labs and Tests Ordered: Current medicines are reviewed at length with  the patient today.  Concerns regarding medicines are outlined above.  Orders Placed This Encounter  Procedures   EKG 12-Lead   VAS US CAROTID  No orders of the defined types were placed in this encounter.    Patient Instructions  Medication Instructions:  No changes *If you need a refill on your cardiac medications before your next appointment, please call your pharmacy*   Lab Work: none If you have labs (blood work) drawn today and your tests are completely normal, you will receive your results only by: [place] (if you have MyChart) OR A paper copy in the mail If you have any lab test that is abnormal or we need to change your treatment, we will call you to review the results.   Testing/Procedures: Your physician has requested that you have a carotid duplex. This test is an ultrasound of the carotid arteries in your neck. It looks at blood flow through these arteries that supply the brain with blood. Allow one hour for this exam. There are no restrictions or special instructions.  Follow-Up: At [facility], you and your health needs are our priority.  As part of our continuing mission to provide you with exceptional heart care, we have created designated Provider Care Teams.  These Care Teams include your primary Cardiologist (physician) and Advanced Practice Providers (APPs -  Physician Assistants and Nurse Practitioners) who all work together to provide you with the care you need, when you need it.  Your next appointment:   12 month(s)  The format for your next appointment:   In Person  Provider:   You may see Mertie Moores, MD or one of the following Advanced Practice Providers on your designated Care Team:   Richardson Dopp, PA-C Robbie Lis, [place]  Other Instructions     Signed, Mertie Moores, MD  09/25/2020 8:59 PM    [place]

## 2020-09-25 ENCOUNTER — Other Ambulatory Visit: Payer: Self-pay

## 2020-09-25 ENCOUNTER — Encounter: Payer: Self-pay | Admitting: Cardiovascular Disease

## 2020-09-25 ENCOUNTER — Ambulatory Visit: Payer: Medicare PPO | Admitting: Cardiovascular Disease

## 2020-09-25 VITALS — BP 124/72 | HR 84 | Ht 68.0 in | Wt 123.4 lb

## 2020-09-25 DIAGNOSIS — R0989 Other specified symptoms and signs involving the circulatory and respiratory systems: Secondary | ICD-10-CM

## 2020-09-25 NOTE — Telephone Encounter (Addendum)
   Primary Cardiologist: Mertie Moores, MD  Chart reviewed as part of pre-operative protocol coverage. Given past medical history and time since last visit, based on ACC/AHA guidelines, Kery Batzel would be at acceptable risk for the planned procedure without further cardiovascular testing.   Per Dr. [name]: Mr. Harker is at low risk for his upcoming back surgery . His BP is well controlled today and is typcially in the normal range.  I will route this recommendation to the requesting party via Epic fax function and remove from pre-op pool.  Please call with questions.  Jossie Ng. Garv Kuechle NP-C    09/25/2020, 3:17 PM [facility] [address] Office [phone] Fax [phone]

## 2020-09-25 NOTE — Patient Instructions (Signed)
Medication Instructions:  No changes *If you need a refill on your cardiac medications before your next appointment, please call your pharmacy*   Lab Work: none If you have labs (blood work) drawn today and your tests are completely normal, you will receive your results only by: [place] (if you have MyChart) OR A paper copy in the mail If you have any lab test that is abnormal or we need to change your treatment, we will call you to review the results.   Testing/Procedures: Your physician has requested that you have a carotid duplex. This test is an ultrasound of the carotid arteries in your neck. It looks at blood flow through these arteries that supply the brain with blood. Allow one hour for this exam. There are no restrictions or special instructions.  Follow-Up: At [facility], you and your health needs are our priority.  As part of our continuing mission to provide you with exceptional heart care, we have created designated Provider Care Teams.  These Care Teams include your primary Cardiologist (physician) and Advanced Practice Providers (APPs -  Physician Assistants and Nurse Practitioners) who all work together to provide you with the care you need, when you need it.  Your next appointment:   12 month(s)  The format for your next appointment:   In Person  Provider:   You may see Mertie Moores, MD or one of the following Advanced Practice Providers on your designated Care Team:   Richardson Dopp, PA-C Robbie Lis, [place]  Other Instructions

## 2020-09-25 NOTE — Telephone Encounter (Signed)
[name] is at low risk for his upcoming back surgery . His BP is well controlled. Today and is typcially in the normal range .

## 2020-09-27 ENCOUNTER — Other Ambulatory Visit (HOSPITAL_COMMUNITY)
Admission: RE | Admit: 2020-09-27 | Discharge: 2020-09-27 | Disposition: A | Discharge status: Home / Self Care | Payer: Medicare PPO | Source: Ambulatory Visit | Attending: Neurological Surgery | Admitting: Neurological Surgery

## 2020-09-27 DIAGNOSIS — Z01812 Encounter for preprocedural laboratory examination: Secondary | ICD-10-CM | POA: Insufficient documentation

## 2020-09-27 DIAGNOSIS — Z20822 Contact with and (suspected) exposure to covid-19: Secondary | ICD-10-CM | POA: Insufficient documentation

## 2020-09-27 LAB — SARS CORONAVIRUS 2 (TAT 6-24 HRS): SARS Coronavirus 2: NEGATIVE

## 2020-09-28 ENCOUNTER — Other Ambulatory Visit: Payer: Self-pay

## 2020-09-28 ENCOUNTER — Encounter (HOSPITAL_COMMUNITY): Payer: Self-pay | Admitting: Neurological Surgery

## 2020-09-28 NOTE — Progress Notes (Signed)
PCP - Dr. Redmond School Cardiologist - Dr. [name] EKG - 09/25/20 Chest x-ray - 09/07/20 ECHO -  Cardiac Cath -  CPAP -   COVID TEST- 09/27/20 negative  Anesthesia review: n/a  -------------  SDW INSTRUCTIONS:  Your procedure is scheduled on Monday 7/25. Please report to [facility] Main Entrance "A" at 1230 P.M., and check in at the Admitting office. Call this number if you have problems the morning of surgery: [phone]   Remember: Do not eat or drink after midnight the night before your surgery   Medications to take morning of surgery with a sip of water include: acetaminophen (TYLENOL) BIKTARVY  rosuvastatin (CRESTOR) nitroGLYCERIN (NITROSTAT)   As of today, STOP taking any Aspirin (unless otherwise instructed by your surgeon), Aleve, Naproxen, Ibuprofen, Motrin, Advil, Goody's, BC's, all herbal medications, fish oil, and all vitamins.    The Morning of Surgery Do not wear jewelry Do not wear lotions, powders, colognes, or deodorant Men may shave face and neck. Do not bring valuables to the hospital. [facility] is not responsible for any belongings or valuables.  If you are a smoker, DO NOT Smoke 24 hours prior to surgery  If you wear a CPAP at night please bring your mask the morning of surgery   Remember that you must have someone to transport you home after your surgery, and remain with you for 24 hours if you are discharged the same day.  Please bring cases for contacts, glasses, hearing aids, dentures or bridgework because it cannot be worn into surgery.   Patients discharged the day of surgery will not be allowed to drive home.   Please shower the NIGHT BEFORE/MORNING OF SURGERY (use antibacterial soap like DIAL soap if possible). Wear comfortable clothes the morning of surgery. Oral Hygiene is also important to reduce your risk of infection.  Remember - BRUSH YOUR TEETH THE MORNING OF SURGERY WITH YOUR REGULAR TOOTHPASTE  Patient denies shortness of breath, fever,  cough and chest pain.

## 2020-10-01 ENCOUNTER — Ambulatory Visit (HOSPITAL_COMMUNITY): Payer: Medicare PPO | Admitting: Anesthesiology

## 2020-10-01 ENCOUNTER — Ambulatory Visit (HOSPITAL_COMMUNITY): Payer: Medicare PPO

## 2020-10-01 ENCOUNTER — Encounter (HOSPITAL_COMMUNITY)
Admission: RE | Disposition: A | Discharge status: Home / Self Care | Payer: Self-pay | Source: Home / Self Care | Attending: Neurological Surgery

## 2020-10-01 ENCOUNTER — Ambulatory Visit (HOSPITAL_COMMUNITY): Payer: Medicare PPO | Admitting: Vascular Surgery

## 2020-10-01 ENCOUNTER — Observation Stay (HOSPITAL_COMMUNITY)
Admission: RE | Admit: 2020-10-01 | Discharge: 2020-10-01 | Disposition: A | Discharge status: Home / Self Care | Payer: Medicare PPO | Attending: Neurological Surgery | Admitting: Neurological Surgery

## 2020-10-01 ENCOUNTER — Other Ambulatory Visit: Payer: Self-pay

## 2020-10-01 ENCOUNTER — Encounter (HOSPITAL_COMMUNITY): Payer: Self-pay | Admitting: Neurological Surgery

## 2020-10-01 DIAGNOSIS — M48062 Spinal stenosis, lumbar region with neurogenic claudication: Secondary | ICD-10-CM | POA: Diagnosis not present

## 2020-10-01 DIAGNOSIS — B20 Human immunodeficiency virus [HIV] disease: Secondary | ICD-10-CM | POA: Insufficient documentation

## 2020-10-01 DIAGNOSIS — E785 Hyperlipidemia, unspecified: Secondary | ICD-10-CM | POA: Diagnosis not present

## 2020-10-01 DIAGNOSIS — Z951 Presence of aortocoronary bypass graft: Secondary | ICD-10-CM | POA: Diagnosis not present

## 2020-10-01 DIAGNOSIS — M4306 Spondylolysis, lumbar region: Secondary | ICD-10-CM | POA: Diagnosis not present

## 2020-10-01 DIAGNOSIS — M48061 Spinal stenosis, lumbar region without neurogenic claudication: Secondary | ICD-10-CM | POA: Diagnosis not present

## 2020-10-01 DIAGNOSIS — F17210 Nicotine dependence, cigarettes, uncomplicated: Secondary | ICD-10-CM | POA: Diagnosis not present

## 2020-10-01 DIAGNOSIS — Z79899 Other long term (current) drug therapy: Secondary | ICD-10-CM | POA: Diagnosis not present

## 2020-10-01 DIAGNOSIS — Z981 Arthrodesis status: Secondary | ICD-10-CM | POA: Diagnosis not present

## 2020-10-01 DIAGNOSIS — Z9889 Other specified postprocedural states: Secondary | ICD-10-CM

## 2020-10-01 DIAGNOSIS — M5116 Intervertebral disc disorders with radiculopathy, lumbar region: Secondary | ICD-10-CM | POA: Diagnosis not present

## 2020-10-01 DIAGNOSIS — Z419 Encounter for procedure for purposes other than remedying health state, unspecified: Secondary | ICD-10-CM

## 2020-10-01 DIAGNOSIS — M5126 Other intervertebral disc displacement, lumbar region: Secondary | ICD-10-CM | POA: Diagnosis not present

## 2020-10-01 HISTORY — PX: LUMBAR LAMINECTOMY/DECOMPRESSION MICRODISCECTOMY: SHX5026

## 2020-10-01 LAB — CBC
HCT: 41.7 % (ref 39.0–52.0)
Hemoglobin: 14.4 g/dL (ref 13.0–17.0)
MCH: 34 pg (ref 26.0–34.0)
MCHC: 34.5 g/dL (ref 30.0–36.0)
MCV: 98.3 fL (ref 80.0–100.0)
Platelets: 157 10*3/uL (ref 150–400)
RBC: 4.24 MIL/uL (ref 4.22–5.81)
RDW: 13 % (ref 11.5–15.5)
WBC: 6.1 10*3/uL (ref 4.0–10.5)
nRBC: 0 % (ref 0.0–0.2)

## 2020-10-01 LAB — BASIC METABOLIC PANEL
Anion gap: 9 (ref 5–15)
BUN: 27 mg/dL — ABNORMAL HIGH (ref 8–23)
CO2: 21 mmol/L — ABNORMAL LOW (ref 22–32)
Calcium: 9.7 mg/dL (ref 8.9–10.3)
Chloride: 106 mmol/L (ref 98–111)
Creatinine, Ser: 1.5 mg/dL — ABNORMAL HIGH (ref 0.61–1.24)
GFR, Estimated: 49 mL/min — ABNORMAL LOW (ref >60)
Glucose, Bld: 98 mg/dL (ref 70–99)
Potassium: 4 mmol/L (ref 3.5–5.1)
Sodium: 136 mmol/L (ref 135–145)

## 2020-10-01 SURGERY — LUMBAR LAMINECTOMY/DECOMPRESSION MICRODISCECTOMY 2 LEVELS
Anesthesia: General | Site: Back | Laterality: Left

## 2020-10-01 MED ORDER — ONDANSETRON HCL 4 MG PO TABS: 4.0000 mg | ORAL_TABLET | Freq: Four times a day (QID) | ORAL | Status: DC | PRN

## 2020-10-01 MED ORDER — LACTATED RINGERS IV SOLN: INTRAVENOUS | Status: DC

## 2020-10-01 MED ORDER — ACETAMINOPHEN 650 MG RE SUPP: 650.0000 mg | RECTAL | Status: DC | PRN

## 2020-10-01 MED ORDER — FENTANYL CITRATE (PF) 100 MCG/2ML IJ SOLN
25.0000 ug | INTRAMUSCULAR | Status: DC | PRN
  Administered 2020-10-01 (×2): 25 ug via INTRAVENOUS

## 2020-10-01 MED ORDER — ONDANSETRON HCL 4 MG/2ML IJ SOLN
4.0000 mg | Freq: Four times a day (QID) | INTRAMUSCULAR | Status: DC | PRN
Start: 2020-10-01 — End: 2020-10-02

## 2020-10-01 MED ORDER — BICTEGRAVIR-EMTRICITAB-TENOFOV 50-200-25 MG PO TABS: 1.0000 | ORAL_TABLET | Freq: Every day | ORAL | Status: DC

## 2020-10-01 MED ORDER — BUPIVACAINE HCL (PF) 0.25 % IJ SOLN
INTRAMUSCULAR | Status: DC | PRN
  Administered 2020-10-01: 3 mL

## 2020-10-01 MED ORDER — CEFAZOLIN SODIUM-DEXTROSE 2-4 GM/100ML-% IV SOLN: 2.0000 g | Freq: Three times a day (TID) | INTRAVENOUS | Status: DC

## 2020-10-01 MED ORDER — OXYCODONE-ACETAMINOPHEN 5-325 MG PO TABS: 1.0000 | ORAL_TABLET | Freq: Four times a day (QID) | ORAL | Status: DC | PRN

## 2020-10-01 MED ORDER — MORPHINE SULFATE (PF) 2 MG/ML IV SOLN: 2.0000 mg | INTRAVENOUS | Status: DC | PRN

## 2020-10-01 MED ORDER — CEFAZOLIN SODIUM-DEXTROSE 2-4 GM/100ML-% IV SOLN
2.0000 g | INTRAVENOUS | Status: AC
  Administered 2020-10-01: 2 g via INTRAVENOUS
  Filled 2020-10-01: qty 100

## 2020-10-01 MED ORDER — KETOROLAC TROMETHAMINE 30 MG/ML IJ SOLN
INTRAMUSCULAR | Status: AC
  Filled 2020-10-01: qty 1

## 2020-10-01 MED ORDER — ONDANSETRON HCL 4 MG/2ML IJ SOLN
INTRAMUSCULAR | Status: DC | PRN
  Administered 2020-10-01: 4 mg via INTRAVENOUS

## 2020-10-01 MED ORDER — ROCURONIUM BROMIDE 10 MG/ML (PF) SYRINGE
PREFILLED_SYRINGE | INTRAVENOUS | Status: DC | PRN
  Administered 2020-10-01: 60 mg via INTRAVENOUS

## 2020-10-01 MED ORDER — THROMBIN 5000 UNITS EX SOLR
CUTANEOUS | Status: AC
  Filled 2020-10-01: qty 5000

## 2020-10-01 MED ORDER — FENTANYL CITRATE (PF) 250 MCG/5ML IJ SOLN
INTRAMUSCULAR | Status: AC
  Filled 2020-10-01: qty 5

## 2020-10-01 MED ORDER — LIDOCAINE 2% (20 MG/ML) 5 ML SYRINGE
INTRAMUSCULAR | Status: DC | PRN
  Administered 2020-10-01: 60 mg via INTRAVENOUS

## 2020-10-01 MED ORDER — BUPIVACAINE HCL (PF) 0.25 % IJ SOLN
INTRAMUSCULAR | Status: AC
  Filled 2020-10-01: qty 30

## 2020-10-01 MED ORDER — GABAPENTIN 300 MG PO CAPS
300.0000 mg | ORAL_CAPSULE | ORAL | Status: AC
  Administered 2020-10-01: 300 mg via ORAL
  Filled 2020-10-01: qty 1

## 2020-10-01 MED ORDER — KETOROLAC TROMETHAMINE 15 MG/ML IJ SOLN
INTRAMUSCULAR | Status: DC | PRN
  Administered 2020-10-01: 15 mg via INTRAVENOUS

## 2020-10-01 MED ORDER — CELECOXIB 200 MG PO CAPS: 200.0000 mg | ORAL_CAPSULE | Freq: Two times a day (BID) | ORAL | Status: DC

## 2020-10-01 MED ORDER — DEXAMETHASONE SODIUM PHOSPHATE 10 MG/ML IJ SOLN
10.0000 mg | Freq: Once | INTRAMUSCULAR | Status: AC
  Administered 2020-10-01: 4 mg via INTRAVENOUS
  Filled 2020-10-01: qty 1

## 2020-10-01 MED ORDER — ACETAMINOPHEN 325 MG PO TABS: 650.0000 mg | ORAL_TABLET | ORAL | Status: DC | PRN

## 2020-10-01 MED ORDER — MIDAZOLAM HCL 2 MG/2ML IJ SOLN
INTRAMUSCULAR | Status: AC
  Filled 2020-10-01: qty 2

## 2020-10-01 MED ORDER — OXYCODONE HCL 5 MG PO TABS: 5.0000 mg | ORAL_TABLET | Freq: Once | ORAL | Status: DC | PRN

## 2020-10-01 MED ORDER — LOSARTAN POTASSIUM 50 MG PO TABS: 50.0000 mg | ORAL_TABLET | Freq: Every day | ORAL | Status: DC

## 2020-10-01 MED ORDER — DEXAMETHASONE SODIUM PHOSPHATE 4 MG/ML IJ SOLN: 4.0000 mg | Freq: Four times a day (QID) | INTRAMUSCULAR | Status: DC

## 2020-10-01 MED ORDER — CHLORHEXIDINE GLUCONATE CLOTH 2 % EX PADS: 6.0000 | MEDICATED_PAD | Freq: Once | CUTANEOUS | Status: DC

## 2020-10-01 MED ORDER — EPHEDRINE SULFATE-NACL 50-0.9 MG/10ML-% IV SOSY
PREFILLED_SYRINGE | INTRAVENOUS | Status: DC | PRN
  Administered 2020-10-01: 5 mg via INTRAVENOUS

## 2020-10-01 MED ORDER — HYDROCODONE-ACETAMINOPHEN 7.5-325 MG PO TABS
1.0000 | ORAL_TABLET | Freq: Four times a day (QID) | ORAL | Status: DC
  Administered 2020-10-01: 1 via ORAL
  Filled 2020-10-01: qty 1

## 2020-10-01 MED ORDER — DEXAMETHASONE 4 MG PO TABS
4.0000 mg | ORAL_TABLET | Freq: Four times a day (QID) | ORAL | Status: DC
  Administered 2020-10-01: 4 mg via ORAL
  Filled 2020-10-01: qty 1

## 2020-10-01 MED ORDER — SODIUM CHLORIDE 0.9% FLUSH: 3.0000 mL | Freq: Two times a day (BID) | INTRAVENOUS | Status: DC

## 2020-10-01 MED ORDER — HYDROCHLOROTHIAZIDE 25 MG PO TABS: 25.0000 mg | ORAL_TABLET | Freq: Every day | ORAL | Status: DC

## 2020-10-01 MED ORDER — PHENOL 1.4 % MT LIQD: 1.0000 | OROMUCOSAL | Status: DC | PRN

## 2020-10-01 MED ORDER — PHENYLEPHRINE HCL-NACL 10-0.9 MG/250ML-% IV SOLN
INTRAVENOUS | Status: DC | PRN
  Administered 2020-10-01: 25 ug/min via INTRAVENOUS

## 2020-10-01 MED ORDER — SODIUM CHLORIDE 0.9 % IV SOLN
250.0000 mL | INTRAVENOUS | Status: DC
  Administered 2020-10-01: 250 mL via INTRAVENOUS

## 2020-10-01 MED ORDER — METHOCARBAMOL 500 MG PO TABS
500.0000 mg | ORAL_TABLET | Freq: Four times a day (QID) | ORAL | Status: DC | PRN
  Administered 2020-10-01: 500 mg via ORAL

## 2020-10-01 MED ORDER — THROMBIN 5000 UNITS EX SOLR
OROMUCOSAL | Status: DC | PRN
  Administered 2020-10-01: 5 mL via TOPICAL

## 2020-10-01 MED ORDER — FENTANYL CITRATE (PF) 100 MCG/2ML IJ SOLN
INTRAMUSCULAR | Status: AC
  Filled 2020-10-01: qty 2

## 2020-10-01 MED ORDER — MIDAZOLAM HCL 5 MG/5ML IJ SOLN
INTRAMUSCULAR | Status: DC | PRN
Start: 2020-10-01 — End: 2020-10-01
  Administered 2020-10-01: .5 mg via INTRAVENOUS

## 2020-10-01 MED ORDER — ORAL CARE MOUTH RINSE: 15.0000 mL | Freq: Once | OROMUCOSAL | Status: AC

## 2020-10-01 MED ORDER — SENNA 8.6 MG PO TABS: 1.0000 | ORAL_TABLET | Freq: Two times a day (BID) | ORAL | Status: DC

## 2020-10-01 MED ORDER — METHOCARBAMOL 500 MG PO TABS
ORAL_TABLET | ORAL | Status: AC
  Filled 2020-10-01: qty 1

## 2020-10-01 MED ORDER — OXYCODONE-ACETAMINOPHEN 5-325 MG PO TABS: 1.0000 | ORAL_TABLET | ORAL | 0 refills | Status: DC | PRN

## 2020-10-01 MED ORDER — LOSARTAN POTASSIUM-HCTZ 50-12.5 MG PO TABS: 1.0000 | ORAL_TABLET | Freq: Every day | ORAL | Status: DC

## 2020-10-01 MED ORDER — ONDANSETRON HCL 4 MG/2ML IJ SOLN
INTRAMUSCULAR | Status: AC
  Filled 2020-10-01: qty 2

## 2020-10-01 MED ORDER — SODIUM CHLORIDE 0.9% FLUSH: 3.0000 mL | INTRAVENOUS | Status: DC | PRN

## 2020-10-01 MED ORDER — ACETAMINOPHEN 500 MG PO TABS
1000.0000 mg | ORAL_TABLET | ORAL | Status: AC
  Administered 2020-10-01: 1000 mg via ORAL
  Filled 2020-10-01: qty 2

## 2020-10-01 MED ORDER — MENTHOL 3 MG MT LOZG: 1.0000 | LOZENGE | OROMUCOSAL | Status: DC | PRN

## 2020-10-01 MED ORDER — EPHEDRINE 5 MG/ML INJ
INTRAVENOUS | Status: AC
  Filled 2020-10-01: qty 5

## 2020-10-01 MED ORDER — SUGAMMADEX SODIUM 200 MG/2ML IV SOLN
INTRAVENOUS | Status: DC | PRN
  Administered 2020-10-01 (×2): 200 mg via INTRAVENOUS

## 2020-10-01 MED ORDER — CHLORHEXIDINE GLUCONATE 0.12 % MT SOLN
15.0000 mL | Freq: Once | OROMUCOSAL | Status: AC
  Administered 2020-10-01: 15 mL via OROMUCOSAL
  Filled 2020-10-01: qty 15

## 2020-10-01 MED ORDER — 0.9 % SODIUM CHLORIDE (POUR BTL) OPTIME
TOPICAL | Status: DC | PRN
  Administered 2020-10-01: 1000 mL

## 2020-10-01 MED ORDER — ONDANSETRON HCL 4 MG/2ML IJ SOLN: 4.0000 mg | Freq: Once | INTRAMUSCULAR | Status: DC | PRN

## 2020-10-01 MED ORDER — PROPOFOL 10 MG/ML IV BOLUS
INTRAVENOUS | Status: DC | PRN
  Administered 2020-10-01: 130 mg via INTRAVENOUS

## 2020-10-01 MED ORDER — POTASSIUM CHLORIDE IN NACL 20-0.9 MEQ/L-% IV SOLN: INTRAVENOUS | Status: DC

## 2020-10-01 MED ORDER — FENTANYL CITRATE (PF) 100 MCG/2ML IJ SOLN
INTRAMUSCULAR | Status: DC | PRN
  Administered 2020-10-01: 100 ug via INTRAVENOUS

## 2020-10-01 MED ORDER — OXYCODONE HCL 5 MG/5ML PO SOLN: 5.0000 mg | Freq: Once | ORAL | Status: DC | PRN

## 2020-10-01 MED ORDER — METHOCARBAMOL 1000 MG/10ML IJ SOLN
500.0000 mg | Freq: Four times a day (QID) | INTRAVENOUS | Status: DC | PRN
  Filled 2020-10-01: qty 5

## 2020-10-01 SURGICAL SUPPLY — 46 items
ADH SKN CLS APL DERMABOND .7 (GAUZE/BANDAGES/DRESSINGS) ×1
APL SKNCLS STERI-STRIP NONHPOA (GAUZE/BANDAGES/DRESSINGS) ×1
BAG COUNTER SPONGE SURGICOUNT (BAG) ×2 IMPLANT
BAG SPNG CNTER NS LX DISP (BAG) ×1
BAND INSRT 18 STRL LF DISP RB (MISCELLANEOUS) ×2
BAND RUBBER #18 3X1/16 STRL (MISCELLANEOUS) ×4 IMPLANT
BENZOIN TINCTURE PRP APPL 2/3 (GAUZE/BANDAGES/DRESSINGS) ×2 IMPLANT
BUR CARBIDE MATCH 3.0 (BURR) ×2 IMPLANT
CANISTER SUCT 3000ML PPV (MISCELLANEOUS) ×2 IMPLANT
DERMABOND ADVANCED (GAUZE/BANDAGES/DRESSINGS) ×1
DERMABOND ADVANCED .7 DNX12 (GAUZE/BANDAGES/DRESSINGS) IMPLANT
DRAPE LAPAROTOMY 100X72X124 (DRAPES) ×2 IMPLANT
DRAPE MICROSCOPE LEICA (MISCELLANEOUS) ×2 IMPLANT
DRAPE SURG 17X23 STRL (DRAPES) ×2 IMPLANT
DRSG OPSITE POSTOP 4X6 (GAUZE/BANDAGES/DRESSINGS) ×1 IMPLANT
DURAPREP 26ML APPLICATOR (WOUND CARE) ×2 IMPLANT
ELECT REM PT RETURN 9FT ADLT (ELECTROSURGICAL) ×2
ELECTRODE REM PT RTRN 9FT ADLT (ELECTROSURGICAL) ×1 IMPLANT
GAUZE 4X4 16PLY ~~LOC~~+RFID DBL (SPONGE) IMPLANT
GLOVE SURG ENC MOIS LTX SZ7 (GLOVE) IMPLANT
GLOVE SURG ENC MOIS LTX SZ8 (GLOVE) ×2 IMPLANT
GLOVE SURG UNDER POLY LF SZ7 (GLOVE) ×2 IMPLANT
GLOVE SURG UNDER POLY LF SZ7.5 (GLOVE) ×3 IMPLANT
GOWN STRL REUS W/ TWL LRG LVL3 (GOWN DISPOSABLE) IMPLANT
GOWN STRL REUS W/ TWL XL LVL3 (GOWN DISPOSABLE) ×1 IMPLANT
GOWN STRL REUS W/TWL 2XL LVL3 (GOWN DISPOSABLE) IMPLANT
GOWN STRL REUS W/TWL LRG LVL3 (GOWN DISPOSABLE) ×2
GOWN STRL REUS W/TWL XL LVL3 (GOWN DISPOSABLE) ×2
HEMOSTAT POWDER KIT SURGIFOAM (HEMOSTASIS) ×2 IMPLANT
KIT BASIN OR (CUSTOM PROCEDURE TRAY) ×2 IMPLANT
KIT TURNOVER KIT B (KITS) ×2 IMPLANT
NDL HYPO 25X1 1.5 SAFETY (NEEDLE) ×1 IMPLANT
NDL SPNL 20GX3.5 QUINCKE YW (NEEDLE) IMPLANT
NEEDLE HYPO 25X1 1.5 SAFETY (NEEDLE) ×2 IMPLANT
NEEDLE SPNL 20GX3.5 QUINCKE YW (NEEDLE) ×2 IMPLANT
NS IRRIG 1000ML POUR BTL (IV SOLUTION) ×2 IMPLANT
PACK LAMINECTOMY NEURO (CUSTOM PROCEDURE TRAY) ×2 IMPLANT
PAD ARMBOARD 7.5X6 YLW CONV (MISCELLANEOUS) ×6 IMPLANT
STRIP CLOSURE SKIN 1/2X4 (GAUZE/BANDAGES/DRESSINGS) ×2 IMPLANT
SUT VIC AB 0 CT1 18XCR BRD8 (SUTURE) ×1 IMPLANT
SUT VIC AB 0 CT1 8-18 (SUTURE) ×2
SUT VIC AB 2-0 CP2 18 (SUTURE) ×2 IMPLANT
SUT VIC AB 3-0 SH 8-18 (SUTURE) ×2 IMPLANT
TOWEL GREEN STERILE (TOWEL DISPOSABLE) ×2 IMPLANT
TOWEL GREEN STERILE FF (TOWEL DISPOSABLE) ×2 IMPLANT
WATER STERILE IRR 1000ML POUR (IV SOLUTION) ×2 IMPLANT

## 2020-10-01 NOTE — Transfer of Care (Signed)
Immediate Anesthesia Transfer of Care Note  Patient: [name]  Procedure(s) Performed: Laminectomy - left - Lumbar three-Lumbar four - Lumbar four-Lumbar five microdiskectomy (Left: Back)  Patient Location: PACU  Anesthesia Type:General  Level of Consciousness: awake and patient cooperative  Airway & Oxygen Therapy: Patient Spontanous Breathing  Post-op Assessment: Report given to RN and Post -op Vital signs reviewed and stable  Post vital signs: Reviewed and stable  Last Vitals:  Vitals Value Taken Time  BP 129/74 10/01/20 1336  Temp    Pulse 71 10/01/20 1339  Resp 12 10/01/20 1339  SpO2 96 % 10/01/20 1339  Vitals shown include unvalidated device data.  Last Pain:  Vitals:   10/01/20 0918  TempSrc:   PainSc: 6       Patients Stated Pain Goal: 2 ([id number])  Complications: No notable events documented.

## 2020-10-01 NOTE — Anesthesia Procedure Notes (Signed)
Procedure Name: Intubation Date/Time: 10/01/2020 12:28 PM Performed by: Cathren Harsh, CRNA Pre-anesthesia Checklist: Patient identified, Emergency Drugs available, Suction available and Patient being monitored Patient Re-evaluated:Patient Re-evaluated prior to induction Oxygen Delivery Method: Circle system utilized Preoxygenation: Pre-oxygenation with 100% oxygen Induction Type: IV induction Ventilation: Mask ventilation without difficulty Laryngoscope Size: Mac and 4 Grade View: Grade II Tube type: Oral Tube size: 7.5 mm Number of attempts: 1 Airway Equipment and Method: Stylet Placement Confirmation: ETT inserted through vocal cords under direct vision, positive ETCO2 and breath sounds checked- equal and bilateral Secured at: 21 cm Tube secured with: Tape Dental Injury: Teeth and Oropharynx as per pre-operative assessment

## 2020-10-01 NOTE — H&P (Signed)
Subjective: Patient is a 75 y.o. male admitted for stenosis. Onset of symptoms was several months ago, gradually worsening since that time.  The pain is rated intense, and is located at the across the lower back and radiates to legs and he has a left foot drop. The pain is described as aching and occurs all day. The symptoms have been progressive. Symptoms are exacerbated by exercise. MRI or CT showed stenosis L3-4 L4-5  Past Medical History:  Diagnosis Date   Allergy    RHINITIS   Arthritis    ASHD (arteriosclerotic heart disease)    Former smoker QUIT 08/09/2010   Herpes labialis    HH (hiatus hernia)    History of kidney stones    HIV infection ([place])    POSITIVE   Hypertriglyceridemia    Psoriasis    ARTHRITIS   Renal stones    Tinnitus    CHRONIC    Past Surgical History:  Procedure Laterality Date   CORONARY ARTERY BYPASS GRAFT  08/20/1998   CABG x4: LIMA-LAD, SVG-Intermediate, SVG-distal CX, SVG-PDA    Prior to Admission medications   Medication Sig Start Date End Date Taking? Authorizing Provider  acetaminophen (TYLENOL) 500 MG tablet Take 1,000 mg by mouth every 8 (eight) hours as needed for moderate pain.   Yes [provider]  BELSOMRA 10 MG TABS TAKE 1 TABLET BY MOUTH EVERY DAY AT BEDTIME AS NEEDED 09/18/20  Yes Denita Lung, MD  BIKTARVY 50-200-25 MG TABS tablet TAKE 1 TABLET BY MOUTH DAILY 01/16/20  Yes Denita Lung, MD  celecoxib (CELEBREX) 200 MG capsule TAKE 1 CAPSULE TWICE DAILY Patient taking differently: Take 200 mg by mouth 2 (two) times daily. 06/27/20  Yes Denita Lung, MD  losartan-hydrochlorothiazide (HYZAAR) 50-12.5 MG tablet TAKE 1 TABLET EVERY DAY Patient taking differently: Take 1 tablet by mouth daily. 02/10/20  Yes Denita Lung, MD  Multiple Vitamins-Minerals (MULTIVITAMIN WITH MINERALS) tablet Take 1 tablet by mouth daily.   Yes [provider]  oxyCODONE-acetaminophen (PERCOCET/ROXICET) 5-325 MG tablet Take 1 tablet by  mouth every 6 (six) hours as needed for severe pain. 08/23/20  Yes [provider]  rosuvastatin (CRESTOR) 20 MG tablet TAKE 1 TABLET EVERY DAY Patient taking differently: Take 20 mg by mouth daily. 02/10/20  Yes Denita Lung, MD  tadalafil (CIALIS) 5 MG tablet TAKE 1 TABLET BY MOUTH EVERY DAY AS NEEDED Patient taking differently: Take 5 mg by mouth daily as needed for erectile dysfunction. 08/21/20  Yes Denita Lung, MD  nitroGLYCERIN (NITROSTAT) 0.3 MG SL tablet Place 1 tablet (0.3 mg total) under the tongue every 5 (five) minutes as needed for chest pain. 09/18/14   Denita Lung, MD  valACYclovir (VALTREX) 1000 MG tablet 2 tablets twice a day for 1 day Patient taking differently: Take 1,000 mg by mouth daily as needed (outbreaks). 04/01/18   Denita Lung, MD   No Known Allergies  Social History   Tobacco Use   Smoking status: Some Days    Packs/day: 0.25    Types: Cigarettes   Smokeless tobacco: Never  Substance Use Topics   Alcohol use: Yes    Alcohol/week: 4.0 standard drinks    Types: 4 Glasses of wine per week    Comment: occasional    Family History  Problem Relation Age of Onset   Coronary artery disease Mother    Coronary artery disease Father    CAD Brother      Review of  Systems  Positive ROS: Negative  All other systems have been reviewed and were otherwise negative with the exception of those mentioned in the HPI and as above.  Objective: Vital signs in last 24 hours: Temp:  [98.1 F (36.7 C)] 98.1 F (36.7 C) (07/25 0859) Pulse Rate:  [86] 86 (07/25 0859) Resp:  [18] 18 (07/25 0859) BP: (142)/(65) 142/65 (07/25 0859) SpO2:  [99 %] 99 % (07/25 0859) Weight:  [56.7 kg] 56.7 kg (07/25 0859)  General Appearance: Alert, cooperative, no distress, appears stated age Head: Normocephalic, without obvious abnormality, atraumatic Eyes: PERRL, conjunctiva/corneas clear, EOM's intact    Neck: Supple, symmetrical, trachea midline Back: Symmetric, no  curvature, ROM normal, no CVA tenderness Lungs:  respirations unlabored Heart: Regular rate and rhythm Abdomen: Soft, non-tender Extremities: Extremities normal, atraumatic, no cyanosis or edema Pulses: 2+ and symmetric all extremities Skin: Skin color, texture, turgor normal, no rashes or lesions  NEUROLOGIC:   Mental status: Alert and oriented x4,  no aphasia, good attention span, fund of knowledge, and memory Motor Exam - grossly normal for left foot drop Sensory Exam - grossly normal Reflexes: Trace Coordination - grossly normal Gait - grossly normal Balance - grossly normal Cranial Nerves: I: smell Not tested  II: visual acuity  OS: nl    OD: nl  II: visual fields Full to confrontation  II: pupils Equal, round, reactive to light  III,VII: ptosis None  III,IV,VI: extraocular muscles  Full ROM  V: mastication Normal  V: facial light touch sensation  Normal  V,VII: corneal reflex  Present  VII: facial muscle function - upper  Normal  VII: facial muscle function - lower Normal  VIII: hearing Not tested  IX: soft palate elevation  Normal  IX,X: gag reflex Present  XI: trapezius strength  5/5  XI: sternocleidomastoid strength 5/5  XI: neck flexion strength  5/5  XII: tongue strength  Normal    Data Review Lab Results  Component Value Date   WBC 6.1 10/01/2020   HGB 14.4 10/01/2020   HCT 41.7 10/01/2020   MCV 98.3 10/01/2020   PLT 157 10/01/2020   Lab Results  Component Value Date   NA 136 10/01/2020   K 4.0 10/01/2020   CL 106 10/01/2020   CO2 21 (L) 10/01/2020   BUN 27 (H) 10/01/2020   CREATININE 1.50 (H) 10/01/2020   GLUCOSE 98 10/01/2020   Lab Results  Component Value Date   INR 1.0 09/07/2020    Assessment/Plan:  Estimated body mass index is 19.29 kg/m as calculated from the following:   Height as of this encounter: 5' 7.5" (1.715 m).   Weight as of this encounter: 56.7 kg. Patient admitted for left L3-4 and L4-5 decompressive hemilaminectomy  with possible discectomy, sublaminar decompression L3-4. Patient has failed a reasonable attempt at conservative therapy.  I explained the condition and procedure to the patient and answered any questions.  Patient wishes to proceed with procedure as planned. Understands risks/ benefits and typical outcomes of procedure.   Eustace Moore 10/01/2020 11:22 AM

## 2020-10-01 NOTE — Op Note (Signed)
10/01/2020  1:29 PM  PATIENT:  [name]  75 y.o. 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  POST-OPERATIVE DIAGNOSIS:  same  PROCEDURE: Decompressive lumbar hemilaminectomy medial facetectomy and foraminotomies L3-4 and L4-5 on the left with sublaminar decompression at L3-4 and a microdiscectomy L4-5 on the left utilizing microscopic dissection  SURGEON:  Sherley Bounds, MD  ASSISTANTS: Glenford Peers FNP  ANESTHESIA:   General  EBL: 25 ml  Total I/O In: 1100 [I.V.:1000; IV Piggyback:100] Out: -   BLOOD ADMINISTERED: none  DRAINS: None in  SPECIMEN:  none  INDICATION FOR PROCEDURE: This patient presented with back and bilateral leg pain with weakness in the left dorsiflexors. Imaging showed severe spinal stenosis L3-4 with left lateral recess stenosis L4-5 with lumbar disc herniation. The patient tried conservative measures without relief. Pain was debilitating. Recommended decompressive laminectomy. Patient understood the risks, benefits, and alternatives and potential outcomes and wished to proceed.  PROCEDURE DETAILS: The patient was taken to the operating room and after induction of adequate generalized endotracheal anesthesia, the patient was rolled into the prone position on the Wilson frame and all pressure points were padded. The lumbar region was cleaned and then prepped with DuraPrep and draped in the usual sterile fashion. 5 cc of local anesthesia was injected and then a dorsal midline incision was made and carried down to the lumbo sacral fascia. The fascia was opened and the paraspinous musculature was taken down in a subperiosteal fashion to expose L3-4 and L4-5 on the left. Intraoperative x-ray confirmed my level, and then I used a combination of the high-speed drill and the Kerrison punches to perform a hemilaminectomy, medial  facetectomy, and foraminotomy at L3-4 and L4-5 on the left. The underlying yellow ligament was opened and removed in a piecemeal fashion to expose the underlying dura and exiting nerve root. I undercut the lateral recess and dissected down until I was medial to and distal to the pedicle. The nerve root was well decompressed.  At L3-4 he had significant central stenosis so we drilled up under the spinous process and opposite lamina and performed a sublaminar decompression until I could see the opposite side of the dura and exiting L4 nerve root.  I palpated the pedicle.  At L4-5 on the left we then gently retracted the nerve root medially with a retractor, found a significant subannular disc herniation, coagulated the epidural venous vasculature, and incised the disc space at L4-5. I performed a thorough intradiscal discectomy with pituitary rongeurs and curettes, until I had a nice decompression of the nerve root and the midline. I then palpated with a coronary dilator along the nerve root and into the foramen to assure adequate decompression. I felt no more compression of the nerve root. I irrigated with saline solution containing bacitracin. Achieved hemostasis with bipolar cautery, lined the dura with Gelfoam, and then closed the fascia with 0 Vicryl. I closed the subcutaneous tissues with 2-0 Vicryl and the subcuticular tissues with 3-0 Vicryl. The skin was then closed with benzoin and Steri-Strips. The drapes were removed, a sterile dressing was applied.  My nurse practitioner was involved in the exposure, safe retraction of the neural elements, the disc work and the closure. the patient was awakened from general anesthesia and transferred to the recovery room in stable condition. At the end of the procedure all sponge, needle and instrument counts were correct.  PLAN OF CARE: Admit for overnight observation  PATIENT DISPOSITION:  PACU - hemodynamically stable.   Delay start of Pharmacological VTE  agent (>24hrs) due to surgical blood loss or risk of bleeding:  yes

## 2020-10-01 NOTE — Evaluation (Signed)
Physical Therapy Evaluation Patient Details Name: [name] MRN: [id number] DOB: [birth year] Today's Date: 10/01/2020   History of Present Illness  Pt adm 7/25 s/p Decompressive lumbar hemilaminectomy medial facetectomy and foraminotomies L3-4 and L4-5 on the left with sublaminar decompression at L3-4 and a microdiscectomy L4-5 on the left utilizing microscopic dissection. PMH - arthritis, HIV, CABG  Clinical Impression  Pt doing well with mobility and no further PT needed.  Ready for dc from PT standpoint. Reviewed back precaution hand out including ADL's. Instructed pt that only exercise for back is ambulation. No "back" exercises until cleared by surgeon. Pt verbalized understanding and eager to DC home.      Follow Up Recommendations No PT follow up    Equipment Recommendations  None recommended by PT    Recommendations for Other Services       Precautions / Restrictions Precautions Precautions: Back Precaution Booklet Issued: Yes (comment) Restrictions Weight Bearing Restrictions: No      Mobility  Bed Mobility Overal bed mobility: Modified Independent             General bed mobility comments: Has high bed so practiced going in on 1 hand and 1 knee and rolling to side    Transfers Overall transfer level: Modified independent Equipment used: None                Ambulation/Gait Ambulation/Gait assistance: Modified independent (Device/Increase time) Gait Distance (Feet): 300 Feet Assistive device: None Gait Pattern/deviations: Step-through pattern   Gait velocity interpretation: >2.62 ft/sec, indicative of community ambulatory General Gait Details: Steady gait. Some lt lateral trunk shift (much less than baseline)  Stairs Stairs: Yes Stairs assistance: Modified independent (Device/Increase time) Stair Management: One rail Right;Alternating pattern;Forwards Number of Stairs: 12    Wheelchair Mobility    Modified Rankin (Stroke  Patients Only)       Balance Overall balance assessment: No apparent balance deficits (not formally assessed)                                           Pertinent Vitals/Pain Pain Assessment: 0-10 Pain Score: 2  Pain Location: back incision Pain Descriptors / Indicators: Sore Pain Intervention(s): Limited activity within patient's tolerance    Home Living Family/patient expects to be discharged to:: Private residence Living Arrangements: Spouse/significant other   Type of Home: House       Home Layout: Two level;Able to live on main level with bedroom/bathroom Home Equipment: None      Prior Function Level of Independence: Independent               Hand Dominance        Extremity/Trunk Assessment   Upper Extremity Assessment Upper Extremity Assessment: Overall WFL for tasks assessed    Lower Extremity Assessment Lower Extremity Assessment: LLE deficits/detail LLE Deficits / Details: dorsiflexion 3+/5       Communication   Communication: No difficulties  Cognition Arousal/Alertness: Awake/alert Behavior During Therapy: WFL for tasks assessed/performed Overall Cognitive Status: Within Functional Limits for tasks assessed                                        General Comments      Exercises     Assessment/Plan    PT Assessment Patent does  not need any further PT services  PT Problem List         PT Treatment Interventions      PT Goals (Current goals can be found in the Care Plan section)  Acute Rehab PT Goals PT Goal Formulation: With patient    Frequency     Barriers to discharge        Co-evaluation               AM-PAC PT "6 Clicks" Mobility  Outcome Measure Help needed turning from your back to your side while in a flat bed without using bedrails?: None Help needed moving from lying on your back to sitting on the side of a flat bed without using bedrails?: None Help needed moving to  and from a bed to a chair (including a wheelchair)?: None Help needed standing up from a chair using your arms (e.g., wheelchair or bedside chair)?: None Help needed to walk in hospital room?: None Help needed climbing 3-5 steps with a railing? : None 6 Click Score: 24    End of Session   Activity Tolerance: Patient tolerated treatment well Patient left: Other (comment) (standing in room) Nurse Communication: Mobility status PT Visit Diagnosis: Other abnormalities of gait and mobility (R26.89)    Time: [id number] PT Time Calculation (min) (ACUTE ONLY): 14 min   Charges:   PT Evaluation $PT Eval Low Complexity: [place] Pager [phone] Office [place] 10/01/2020, 6:10 PM

## 2020-10-01 NOTE — Discharge Instructions (Signed)
Wound Care Keep incision covered and dry for two days. Do not put any creams, lotions, or ointments on incision. Leave steri-strips on back.  They will fall off by themselves. Activity Walk each and every day, increasing distance each day. No lifting greater than 5 lbs.  Avoid excessive back bending No driving for 2 weeks; may ride as a passenger locally.  Diet Resume your normal diet.   Call Your Doctor If Any of These Occur Redness, drainage, or swelling at the wound.  Temperature greater than 101 degrees. Severe pain not relieved by pain medication. Incision starts to come apart. Follow Up Appt Call  [phone])  for problems.

## 2020-10-01 NOTE — Anesthesia Postprocedure Evaluation (Signed)
Anesthesia Post Note  Patient: [name]  Procedure(s) Performed: Laminectomy - left - Lumbar three-Lumbar four - Lumbar four-Lumbar five microdiskectomy (Left: Back)     Patient location during evaluation: PACU Anesthesia Type: General Level of consciousness: awake and alert Pain management: pain level controlled Vital Signs Assessment: post-procedure vital signs reviewed and stable Respiratory status: spontaneous breathing, nonlabored ventilation, respiratory function stable and patient connected to nasal cannula oxygen Cardiovascular status: blood pressure returned to baseline and stable Postop Assessment: no apparent nausea or vomiting Anesthetic complications: no   No notable events documented.  Last Vitals:  Vitals:   10/01/20 1436 10/01/20 1457  BP: 122/62 131/69  Pulse: 61 (!) 54  Resp: 13 18  Temp:    SpO2: 98% 99%    Last Pain:  Vitals:   10/01/20 1436  TempSrc:   PainSc: 2                  March Rummage [name]

## 2020-10-01 NOTE — Plan of Care (Signed)
Adequately Ready for Discharge 

## 2020-10-01 NOTE — Discharge Summary (Signed)
Physician Discharge Summary  Patient ID: [name] MRN: [id number] DOB/AGE: [birth year] 75 y.o.  Admit date: 10/01/2020 Discharge date: 10/01/2020  Admission Diagnoses: stenosis/ radiculopathy/ foot drop    Discharge Diagnoses: same   Discharged Condition: good  Hospital Course: The patient was admitted on 10/01/2020 and taken to the operating room where the patient underwent DLL L3-4 L4-5 with diskectomy. The patient tolerated the procedure well and was taken to the recovery room and then to the floor in stable condition. The hospital course was routine. There were no complications. The wound remained clean dry and intact. Pt had appropriate back soreness. No complaints of leg pain or new N/T/W. The patient remained afebrile with stable vital signs, and tolerated a regular diet. The patient continued to increase activities, and pain was well controlled with oral pain medications.   Consults: None  Significant Diagnostic Studies:  Results for orders placed or performed during the hospital encounter of 0000000  Basic metabolic panel  Result Value Ref Range   Sodium 136 135 - 145 mmol/L   Potassium 4.0 3.5 - 5.1 mmol/L   Chloride 106 98 - 111 mmol/L   CO2 21 (L) 22 - 32 mmol/L   Glucose, Bld 98 70 - 99 mg/dL   BUN 27 (H) 8 - 23 mg/dL   Creatinine, Ser 1.50 (H) 0.61 - 1.24 mg/dL   Calcium 9.7 8.9 - 10.3 mg/dL   GFR, Estimated 49 (L) >60 mL/min   Anion gap 9 5 - 15  CBC per protocol  Result Value Ref Range   WBC 6.1 4.0 - 10.5 K/uL   RBC 4.24 4.22 - 5.81 MIL/uL   Hemoglobin 14.4 13.0 - 17.0 g/dL   HCT 41.7 39.0 - 52.0 %   MCV 98.3 80.0 - 100.0 fL   MCH 34.0 26.0 - 34.0 pg   MCHC 34.5 30.0 - 36.0 g/dL   RDW 13.0 11.5 - 15.5 %   Platelets 157 150 - 400 K/uL   nRBC 0.0 0.0 - 0.2 %    Chest 2 View  Result Date: 09/10/2020 CLINICAL DATA:  Preoperative respiratory evaluation for lumbar spine surgery. EXAM: CHEST - 2 VIEW COMPARISON:  05/02/2003 FINDINGS: The lungs are  clear without focal pneumonia, edema, pneumothorax or pleural effusion. Status post CABG. The cardiopericardial silhouette is within normal limits for size. The visualized bony structures of the thorax show no acute abnormality. IMPRESSION: No active cardiopulmonary disease. Electronically Signed   By: Misty Stanley M.D.   On: 09/10/2020 17:46   DG Lumbar Spine 1 View  Result Date: 10/01/2020 CLINICAL DATA:  75 year old male with lumbar localization. EXAM: LUMBAR SPINE - 1 VIEW COMPARISON:  Lumbar spine radiograph dated 11/29/2018 and MRI dated 12/23/2019. FINDINGS: The tip of the localizer is at the level of L4-L5 facet and L5 superior endplate. IMPRESSION: Localizer at the level of L5 superior endplate. Electronically Signed   By: Anner Crete M.D.   On: 10/01/2020 15:46    Antibiotics:  Anti-infectives (From admission, onward)    Start     Dose/Rate Route Frequency Ordered Stop   10/02/20 1000  bictegravir-emtricitabine-tenofovir AF (BIKTARVY) 50-200-25 MG per tablet 1 tablet        1 tablet Oral Daily 10/01/20 1446     10/01/20 2000  ceFAZolin (ANCEF) IVPB 2g/100 mL premix        2 g 200 mL/hr over 30 Minutes Intravenous Every 8 hours 10/01/20 1445 10/02/20 1159   10/01/20 0915  ceFAZolin (ANCEF) IVPB 2g/100  mL premix        2 g 200 mL/hr over 30 Minutes Intravenous On call to O.R. 10/01/20 0902 10/01/20 1245       Discharge Exam: Blood pressure 140/67, pulse 60, temperature 97.6 F (36.4 C), temperature source Oral, resp. rate 18, height 5' 7.5" (1.715 m), weight 56.7 kg, SpO2 100 %. Neurologic: Grossly normal Dressing dry  Discharge Medications:   Allergies as of 10/01/2020   No Known Allergies      Medication List     TAKE these medications    acetaminophen 500 MG tablet Commonly known as: TYLENOL Take 1,000 mg by mouth every 8 (eight) hours as needed for moderate pain.   Belsomra 10 MG Tabs Generic drug: Suvorexant TAKE 1 TABLET BY MOUTH EVERY DAY AT BEDTIME AS  NEEDED   Biktarvy 50-200-25 MG Tabs tablet Generic drug: bictegravir-emtricitabine-tenofovir AF TAKE 1 TABLET BY MOUTH DAILY   multivitamin with minerals tablet Take 1 tablet by mouth daily.   nitroGLYCERIN 0.3 MG SL tablet Commonly known as: NITROSTAT Place 1 tablet (0.3 mg total) under the tongue every 5 (five) minutes as needed for chest pain.   oxyCODONE-acetaminophen 5-325 MG tablet Commonly known as: PERCOCET/ROXICET Take 1 tablet by mouth every 4 (four) hours as needed for severe pain. What changed: when to take this       ASK your doctor about these medications    celecoxib 200 MG capsule Commonly known as: CELEBREX TAKE 1 CAPSULE TWICE DAILY   losartan-hydrochlorothiazide 50-12.5 MG tablet Commonly known as: HYZAAR TAKE 1 TABLET EVERY DAY   rosuvastatin 20 MG tablet Commonly known as: CRESTOR TAKE 1 TABLET EVERY DAY   tadalafil 5 MG tablet Commonly known as: CIALIS TAKE 1 TABLET BY MOUTH EVERY DAY AS NEEDED   valACYclovir 1000 MG tablet Commonly known as: VALTREX 2 tablets twice a day for 1 day        Disposition: home  Final Dx: LL L3-4 L4-5  Discharge Instructions      Remove dressing in 72 hours   Complete by: As directed    Call MD for:  difficulty breathing, headache or visual disturbances   Complete by: As directed    Call MD for:  persistant nausea and vomiting   Complete by: As directed    Call MD for:  redness, tenderness, or signs of infection (pain, swelling, redness, odor or green/yellow discharge around incision site)   Complete by: As directed    Call MD for:  severe uncontrolled pain   Complete by: As directed    Call MD for:  temperature >100.4   Complete by: As directed    Diet - low sodium heart healthy   Complete by: As directed    Increase activity slowly   Complete by: As directed         Follow-up Information     Eustace Moore, MD. Schedule an appointment as soon as possible for a visit in 2 week(s).    Specialty: Neurosurgery Contact information: [address] [employer] [address] [place] [address] [phone]                  Signed: Eustace Moore 10/01/2020, 5:13 PM

## 2020-10-02 ENCOUNTER — Encounter (HOSPITAL_COMMUNITY): Payer: Self-pay | Admitting: Neurological Surgery

## 2020-10-18 DIAGNOSIS — M545 Low back pain, unspecified: Secondary | ICD-10-CM | POA: Diagnosis not present

## 2020-10-18 DIAGNOSIS — R293 Abnormal posture: Secondary | ICD-10-CM | POA: Diagnosis not present

## 2020-10-18 DIAGNOSIS — M6258 Muscle wasting and atrophy, not elsewhere classified, other site: Secondary | ICD-10-CM | POA: Diagnosis not present

## 2020-10-18 DIAGNOSIS — M546 Pain in thoracic spine: Secondary | ICD-10-CM | POA: Diagnosis not present

## 2020-10-23 DIAGNOSIS — M546 Pain in thoracic spine: Secondary | ICD-10-CM | POA: Diagnosis not present

## 2020-10-23 DIAGNOSIS — M545 Low back pain, unspecified: Secondary | ICD-10-CM | POA: Diagnosis not present

## 2020-10-23 DIAGNOSIS — R293 Abnormal posture: Secondary | ICD-10-CM | POA: Diagnosis not present

## 2020-10-23 DIAGNOSIS — M6258 Muscle wasting and atrophy, not elsewhere classified, other site: Secondary | ICD-10-CM | POA: Diagnosis not present

## 2020-10-25 DIAGNOSIS — M6258 Muscle wasting and atrophy, not elsewhere classified, other site: Secondary | ICD-10-CM | POA: Diagnosis not present

## 2020-10-25 DIAGNOSIS — M545 Low back pain, unspecified: Secondary | ICD-10-CM | POA: Diagnosis not present

## 2020-10-25 DIAGNOSIS — M546 Pain in thoracic spine: Secondary | ICD-10-CM | POA: Diagnosis not present

## 2020-10-25 DIAGNOSIS — R293 Abnormal posture: Secondary | ICD-10-CM | POA: Diagnosis not present

## 2020-10-30 DIAGNOSIS — M6258 Muscle wasting and atrophy, not elsewhere classified, other site: Secondary | ICD-10-CM | POA: Diagnosis not present

## 2020-10-30 DIAGNOSIS — M546 Pain in thoracic spine: Secondary | ICD-10-CM | POA: Diagnosis not present

## 2020-10-30 DIAGNOSIS — R293 Abnormal posture: Secondary | ICD-10-CM | POA: Diagnosis not present

## 2020-10-30 DIAGNOSIS — M545 Low back pain, unspecified: Secondary | ICD-10-CM | POA: Diagnosis not present

## 2020-10-31 ENCOUNTER — Encounter: Payer: Self-pay | Admitting: Family Medicine

## 2020-10-31 ENCOUNTER — Other Ambulatory Visit: Payer: Self-pay

## 2020-10-31 ENCOUNTER — Ambulatory Visit: Payer: Medicare PPO | Admitting: Family Medicine

## 2020-10-31 VITALS — BP 114/68 | HR 72 | Temp 96.0°F | Wt 120.2 lb

## 2020-10-31 DIAGNOSIS — R634 Abnormal weight loss: Secondary | ICD-10-CM

## 2020-10-31 DIAGNOSIS — Z209 Contact with and (suspected) exposure to unspecified communicable disease: Secondary | ICD-10-CM | POA: Diagnosis not present

## 2020-10-31 DIAGNOSIS — Z113 Encounter for screening for infections with a predominantly sexual mode of transmission: Secondary | ICD-10-CM | POA: Diagnosis not present

## 2020-10-31 DIAGNOSIS — Z205 Contact with and (suspected) exposure to viral hepatitis: Secondary | ICD-10-CM | POA: Diagnosis not present

## 2020-10-31 DIAGNOSIS — G479 Sleep disorder, unspecified: Secondary | ICD-10-CM

## 2020-10-31 MED ORDER — BELSOMRA 10 MG PO TABS: ORAL_TABLET | ORAL | 0 refills | Status: DC

## 2020-10-31 MED ORDER — DRONABINOL 2.5 MG PO CAPS: 2.5000 mg | ORAL_CAPSULE | Freq: Two times a day (BID) | ORAL | 0 refills | Status: DC

## 2020-10-31 NOTE — Progress Notes (Signed)
   Subjective:    Patient ID: [name], male    DOB: [birth year], 75 y.o.   MRN: [id number]  HPI He is here for consult concerning possible exposure to hepatitis B.  He has had no news recent sexual activity but still wants to be tested for STDs.  He also would like a refill on his Belsomra.  He uses this very sparingly. His main concern is continued weight loss.  He has been evaluated in the past and no etiology can be found.  Is concerned that this could be HIV related which is certainly possible.   Review of Systems     Objective:   Physical Exam Alert and in no distress otherwise not examined       Assessment & Plan:  Sleep disturbance - Plan: Suvorexant (BELSOMRA) 10 MG TABS  Weight loss - Plan: Testosterone, dronabinol (MARINOL) 2.5 MG capsule  Contact with or exposure to communicable disease - Plan: RPR, GC/Chlamydia Probe Amp  Exposure to hepatitis B - Plan: Hepatitis B surface antibody,qualitative Discussed the weight loss with him and I think is reasonable to make sure that there is no testosterone issues and then see if Marinol will help with his weight gain.  He was comfortable with that. Also discussed the use of belts, and he is using this appropriately.

## 2020-11-01 DIAGNOSIS — M545 Low back pain, unspecified: Secondary | ICD-10-CM | POA: Diagnosis not present

## 2020-11-01 DIAGNOSIS — M546 Pain in thoracic spine: Secondary | ICD-10-CM | POA: Diagnosis not present

## 2020-11-01 DIAGNOSIS — R293 Abnormal posture: Secondary | ICD-10-CM | POA: Diagnosis not present

## 2020-11-01 DIAGNOSIS — M6258 Muscle wasting and atrophy, not elsewhere classified, other site: Secondary | ICD-10-CM | POA: Diagnosis not present

## 2020-11-01 LAB — TESTOSTERONE: Testosterone: 369 ng/dL (ref 264–916)

## 2020-11-01 LAB — RPR: RPR Ser Ql: NONREACTIVE

## 2020-11-01 LAB — HEPATITIS B SURFACE ANTIBODY,QUALITATIVE: Hep B Surface Ab, Qual: REACTIVE

## 2020-11-03 LAB — GC/CHLAMYDIA PROBE AMP
Chlamydia trachomatis, NAA: NEGATIVE
Neisseria Gonorrhoeae by PCR: NEGATIVE

## 2020-11-06 DIAGNOSIS — M545 Low back pain, unspecified: Secondary | ICD-10-CM | POA: Diagnosis not present

## 2020-11-06 DIAGNOSIS — M546 Pain in thoracic spine: Secondary | ICD-10-CM | POA: Diagnosis not present

## 2020-11-06 DIAGNOSIS — R293 Abnormal posture: Secondary | ICD-10-CM | POA: Diagnosis not present

## 2020-11-06 DIAGNOSIS — M6258 Muscle wasting and atrophy, not elsewhere classified, other site: Secondary | ICD-10-CM | POA: Diagnosis not present

## 2020-11-08 ENCOUNTER — Other Ambulatory Visit: Payer: Self-pay | Admitting: Family Medicine

## 2020-11-08 DIAGNOSIS — M545 Low back pain, unspecified: Secondary | ICD-10-CM | POA: Diagnosis not present

## 2020-11-08 DIAGNOSIS — M6258 Muscle wasting and atrophy, not elsewhere classified, other site: Secondary | ICD-10-CM | POA: Diagnosis not present

## 2020-11-08 DIAGNOSIS — R293 Abnormal posture: Secondary | ICD-10-CM | POA: Diagnosis not present

## 2020-11-08 DIAGNOSIS — N521 Erectile dysfunction due to diseases classified elsewhere: Secondary | ICD-10-CM

## 2020-11-08 DIAGNOSIS — M546 Pain in thoracic spine: Secondary | ICD-10-CM | POA: Diagnosis not present

## 2020-11-08 NOTE — Telephone Encounter (Signed)
Is this okay to refill? 

## 2020-11-13 DIAGNOSIS — M545 Low back pain, unspecified: Secondary | ICD-10-CM | POA: Diagnosis not present

## 2020-11-13 DIAGNOSIS — M546 Pain in thoracic spine: Secondary | ICD-10-CM | POA: Diagnosis not present

## 2020-11-13 DIAGNOSIS — M6258 Muscle wasting and atrophy, not elsewhere classified, other site: Secondary | ICD-10-CM | POA: Diagnosis not present

## 2020-11-13 DIAGNOSIS — R293 Abnormal posture: Secondary | ICD-10-CM | POA: Diagnosis not present

## 2020-11-15 DIAGNOSIS — R293 Abnormal posture: Secondary | ICD-10-CM | POA: Diagnosis not present

## 2020-11-15 DIAGNOSIS — M6258 Muscle wasting and atrophy, not elsewhere classified, other site: Secondary | ICD-10-CM | POA: Diagnosis not present

## 2020-11-15 DIAGNOSIS — M546 Pain in thoracic spine: Secondary | ICD-10-CM | POA: Diagnosis not present

## 2020-11-15 DIAGNOSIS — M545 Low back pain, unspecified: Secondary | ICD-10-CM | POA: Diagnosis not present

## 2020-11-20 DIAGNOSIS — M545 Low back pain, unspecified: Secondary | ICD-10-CM | POA: Diagnosis not present

## 2020-11-20 DIAGNOSIS — R293 Abnormal posture: Secondary | ICD-10-CM | POA: Diagnosis not present

## 2020-11-20 DIAGNOSIS — M6258 Muscle wasting and atrophy, not elsewhere classified, other site: Secondary | ICD-10-CM | POA: Diagnosis not present

## 2020-11-20 DIAGNOSIS — M546 Pain in thoracic spine: Secondary | ICD-10-CM | POA: Diagnosis not present

## 2020-11-22 DIAGNOSIS — R293 Abnormal posture: Secondary | ICD-10-CM | POA: Diagnosis not present

## 2020-11-22 DIAGNOSIS — M545 Low back pain, unspecified: Secondary | ICD-10-CM | POA: Diagnosis not present

## 2020-11-22 DIAGNOSIS — M6258 Muscle wasting and atrophy, not elsewhere classified, other site: Secondary | ICD-10-CM | POA: Diagnosis not present

## 2020-11-22 DIAGNOSIS — M546 Pain in thoracic spine: Secondary | ICD-10-CM | POA: Diagnosis not present

## 2020-11-28 DIAGNOSIS — M6258 Muscle wasting and atrophy, not elsewhere classified, other site: Secondary | ICD-10-CM | POA: Diagnosis not present

## 2020-11-28 DIAGNOSIS — M545 Low back pain, unspecified: Secondary | ICD-10-CM | POA: Diagnosis not present

## 2020-11-28 DIAGNOSIS — R293 Abnormal posture: Secondary | ICD-10-CM | POA: Diagnosis not present

## 2020-11-28 DIAGNOSIS — M546 Pain in thoracic spine: Secondary | ICD-10-CM | POA: Diagnosis not present

## 2020-11-29 DIAGNOSIS — M6258 Muscle wasting and atrophy, not elsewhere classified, other site: Secondary | ICD-10-CM | POA: Diagnosis not present

## 2020-11-29 DIAGNOSIS — R293 Abnormal posture: Secondary | ICD-10-CM | POA: Diagnosis not present

## 2020-11-29 DIAGNOSIS — M546 Pain in thoracic spine: Secondary | ICD-10-CM | POA: Diagnosis not present

## 2020-11-29 DIAGNOSIS — M545 Low back pain, unspecified: Secondary | ICD-10-CM | POA: Diagnosis not present

## 2020-12-04 ENCOUNTER — Other Ambulatory Visit: Payer: Self-pay

## 2020-12-04 ENCOUNTER — Ambulatory Visit (HOSPITAL_COMMUNITY)
Admission: RE | Admit: 2020-12-04 | Discharge: 2020-12-04 | Disposition: A | Discharge status: Home / Self Care | Payer: Medicare PPO | Source: Ambulatory Visit | Attending: Cardiovascular Disease | Admitting: Cardiovascular Disease

## 2020-12-04 DIAGNOSIS — R0989 Other specified symptoms and signs involving the circulatory and respiratory systems: Secondary | ICD-10-CM | POA: Diagnosis not present

## 2020-12-04 DIAGNOSIS — M6258 Muscle wasting and atrophy, not elsewhere classified, other site: Secondary | ICD-10-CM | POA: Diagnosis not present

## 2020-12-04 DIAGNOSIS — R293 Abnormal posture: Secondary | ICD-10-CM | POA: Diagnosis not present

## 2020-12-04 DIAGNOSIS — M546 Pain in thoracic spine: Secondary | ICD-10-CM | POA: Diagnosis not present

## 2020-12-04 DIAGNOSIS — M545 Low back pain, unspecified: Secondary | ICD-10-CM | POA: Diagnosis not present

## 2020-12-05 ENCOUNTER — Other Ambulatory Visit: Payer: Self-pay | Admitting: Medical

## 2020-12-05 DIAGNOSIS — N521 Erectile dysfunction due to diseases classified elsewhere: Secondary | ICD-10-CM

## 2020-12-06 DIAGNOSIS — R293 Abnormal posture: Secondary | ICD-10-CM | POA: Diagnosis not present

## 2020-12-06 DIAGNOSIS — M6258 Muscle wasting and atrophy, not elsewhere classified, other site: Secondary | ICD-10-CM | POA: Diagnosis not present

## 2020-12-06 DIAGNOSIS — M545 Low back pain, unspecified: Secondary | ICD-10-CM | POA: Diagnosis not present

## 2020-12-06 DIAGNOSIS — M546 Pain in thoracic spine: Secondary | ICD-10-CM | POA: Diagnosis not present

## 2020-12-11 DIAGNOSIS — R293 Abnormal posture: Secondary | ICD-10-CM | POA: Diagnosis not present

## 2020-12-11 DIAGNOSIS — M546 Pain in thoracic spine: Secondary | ICD-10-CM | POA: Diagnosis not present

## 2020-12-11 DIAGNOSIS — M6258 Muscle wasting and atrophy, not elsewhere classified, other site: Secondary | ICD-10-CM | POA: Diagnosis not present

## 2020-12-11 DIAGNOSIS — M545 Low back pain, unspecified: Secondary | ICD-10-CM | POA: Diagnosis not present

## 2020-12-13 DIAGNOSIS — M546 Pain in thoracic spine: Secondary | ICD-10-CM | POA: Diagnosis not present

## 2020-12-13 DIAGNOSIS — M6258 Muscle wasting and atrophy, not elsewhere classified, other site: Secondary | ICD-10-CM | POA: Diagnosis not present

## 2020-12-13 DIAGNOSIS — R293 Abnormal posture: Secondary | ICD-10-CM | POA: Diagnosis not present

## 2020-12-13 DIAGNOSIS — M545 Low back pain, unspecified: Secondary | ICD-10-CM | POA: Diagnosis not present

## 2020-12-18 ENCOUNTER — Telehealth: Payer: Self-pay

## 2020-12-18 DIAGNOSIS — M6258 Muscle wasting and atrophy, not elsewhere classified, other site: Secondary | ICD-10-CM | POA: Diagnosis not present

## 2020-12-18 DIAGNOSIS — R293 Abnormal posture: Secondary | ICD-10-CM | POA: Diagnosis not present

## 2020-12-18 DIAGNOSIS — M546 Pain in thoracic spine: Secondary | ICD-10-CM | POA: Diagnosis not present

## 2020-12-18 DIAGNOSIS — M545 Low back pain, unspecified: Secondary | ICD-10-CM | POA: Diagnosis not present

## 2020-12-18 MED ORDER — CELECOXIB 200 MG PO CAPS: 200.0000 mg | ORAL_CAPSULE | Freq: Two times a day (BID) | ORAL | 1 refills | Status: DC

## 2020-12-18 NOTE — Telephone Encounter (Signed)
Received fax from [facility] [place] for a refill on Celecoxib last apt was 10/31/20 and next apt is 03/07/21.

## 2020-12-18 NOTE — Addendum Note (Signed)
Addended by: Denita Lung on: 12/18/2020 01:35 PM   Modules accepted: Orders

## 2020-12-20 DIAGNOSIS — R293 Abnormal posture: Secondary | ICD-10-CM | POA: Diagnosis not present

## 2020-12-20 DIAGNOSIS — M546 Pain in thoracic spine: Secondary | ICD-10-CM | POA: Diagnosis not present

## 2020-12-20 DIAGNOSIS — M6258 Muscle wasting and atrophy, not elsewhere classified, other site: Secondary | ICD-10-CM | POA: Diagnosis not present

## 2020-12-20 DIAGNOSIS — M545 Low back pain, unspecified: Secondary | ICD-10-CM | POA: Diagnosis not present

## 2020-12-25 DIAGNOSIS — M546 Pain in thoracic spine: Secondary | ICD-10-CM | POA: Diagnosis not present

## 2020-12-25 DIAGNOSIS — M545 Low back pain, unspecified: Secondary | ICD-10-CM | POA: Diagnosis not present

## 2020-12-25 DIAGNOSIS — M6258 Muscle wasting and atrophy, not elsewhere classified, other site: Secondary | ICD-10-CM | POA: Diagnosis not present

## 2020-12-25 DIAGNOSIS — R293 Abnormal posture: Secondary | ICD-10-CM | POA: Diagnosis not present

## 2020-12-27 ENCOUNTER — Other Ambulatory Visit: Payer: Medicare PPO

## 2020-12-27 DIAGNOSIS — M6258 Muscle wasting and atrophy, not elsewhere classified, other site: Secondary | ICD-10-CM | POA: Diagnosis not present

## 2020-12-27 DIAGNOSIS — R293 Abnormal posture: Secondary | ICD-10-CM | POA: Diagnosis not present

## 2020-12-27 DIAGNOSIS — M546 Pain in thoracic spine: Secondary | ICD-10-CM | POA: Diagnosis not present

## 2020-12-27 DIAGNOSIS — M545 Low back pain, unspecified: Secondary | ICD-10-CM | POA: Diagnosis not present

## 2020-12-28 ENCOUNTER — Other Ambulatory Visit: Payer: Self-pay

## 2020-12-28 ENCOUNTER — Other Ambulatory Visit: Payer: Medicare PPO

## 2020-12-28 DIAGNOSIS — Z23 Encounter for immunization: Secondary | ICD-10-CM

## 2021-01-01 ENCOUNTER — Other Ambulatory Visit: Payer: Self-pay | Admitting: Family Medicine

## 2021-01-01 DIAGNOSIS — M545 Low back pain, unspecified: Secondary | ICD-10-CM | POA: Diagnosis not present

## 2021-01-01 DIAGNOSIS — M6258 Muscle wasting and atrophy, not elsewhere classified, other site: Secondary | ICD-10-CM | POA: Diagnosis not present

## 2021-01-01 DIAGNOSIS — R293 Abnormal posture: Secondary | ICD-10-CM | POA: Diagnosis not present

## 2021-01-01 DIAGNOSIS — N521 Erectile dysfunction due to diseases classified elsewhere: Secondary | ICD-10-CM

## 2021-01-01 DIAGNOSIS — M546 Pain in thoracic spine: Secondary | ICD-10-CM | POA: Diagnosis not present

## 2021-01-01 NOTE — Telephone Encounter (Signed)
[facility] is requesting to fill pt tadalafil. Please advise [facility]

## 2021-01-02 ENCOUNTER — Other Ambulatory Visit: Payer: Self-pay | Admitting: Family Medicine

## 2021-01-02 DIAGNOSIS — I10 Essential (primary) hypertension: Secondary | ICD-10-CM

## 2021-01-02 DIAGNOSIS — E785 Hyperlipidemia, unspecified: Secondary | ICD-10-CM

## 2021-01-03 DIAGNOSIS — M546 Pain in thoracic spine: Secondary | ICD-10-CM | POA: Diagnosis not present

## 2021-01-03 DIAGNOSIS — M6258 Muscle wasting and atrophy, not elsewhere classified, other site: Secondary | ICD-10-CM | POA: Diagnosis not present

## 2021-01-03 DIAGNOSIS — R293 Abnormal posture: Secondary | ICD-10-CM | POA: Diagnosis not present

## 2021-01-03 DIAGNOSIS — M545 Low back pain, unspecified: Secondary | ICD-10-CM | POA: Diagnosis not present

## 2021-01-08 DIAGNOSIS — M545 Low back pain, unspecified: Secondary | ICD-10-CM | POA: Diagnosis not present

## 2021-01-08 DIAGNOSIS — M6258 Muscle wasting and atrophy, not elsewhere classified, other site: Secondary | ICD-10-CM | POA: Diagnosis not present

## 2021-01-08 DIAGNOSIS — R293 Abnormal posture: Secondary | ICD-10-CM | POA: Diagnosis not present

## 2021-01-08 DIAGNOSIS — M546 Pain in thoracic spine: Secondary | ICD-10-CM | POA: Diagnosis not present

## 2021-01-21 ENCOUNTER — Telehealth: Payer: Self-pay

## 2021-01-21 DIAGNOSIS — Z21 Asymptomatic human immunodeficiency virus [HIV] infection status: Secondary | ICD-10-CM

## 2021-01-21 MED ORDER — BIKTARVY 50-200-25 MG PO TABS: 1.0000 | ORAL_TABLET | Freq: Every day | ORAL | 3 refills | Status: DC

## 2021-01-21 NOTE — Telephone Encounter (Signed)
Received fax from [facility] for a refill on the pts. Biktarvy pt. Last apt was 10/31/20 and next apt is 03/07/21.

## 2021-01-24 ENCOUNTER — Telehealth: Payer: Self-pay

## 2021-01-24 NOTE — Telephone Encounter (Signed)
[place]. Please advise if he needs to coe in fro repeat labs just in case to level are off. Please advise [facility]

## 2021-01-25 NOTE — Telephone Encounter (Signed)
Pt was advised [name] 

## 2021-01-29 DIAGNOSIS — M545 Low back pain, unspecified: Secondary | ICD-10-CM | POA: Diagnosis not present

## 2021-01-29 DIAGNOSIS — M546 Pain in thoracic spine: Secondary | ICD-10-CM | POA: Diagnosis not present

## 2021-01-29 DIAGNOSIS — R293 Abnormal posture: Secondary | ICD-10-CM | POA: Diagnosis not present

## 2021-01-29 DIAGNOSIS — M6258 Muscle wasting and atrophy, not elsewhere classified, other site: Secondary | ICD-10-CM | POA: Diagnosis not present

## 2021-02-18 ENCOUNTER — Other Ambulatory Visit: Payer: Self-pay | Admitting: Family Medicine

## 2021-02-18 DIAGNOSIS — N521 Erectile dysfunction due to diseases classified elsewhere: Secondary | ICD-10-CM

## 2021-02-18 DIAGNOSIS — G479 Sleep disorder, unspecified: Secondary | ICD-10-CM

## 2021-02-18 NOTE — Telephone Encounter (Signed)
[facility] is requesting to fill pt belsomra and cialis. Please advise [facility]

## 2021-03-01 ENCOUNTER — Telehealth: Payer: Self-pay

## 2021-03-01 NOTE — Telephone Encounter (Signed)
Recv'd another P.A. TADALAFIL, pt uses [facility]

## 2021-03-07 ENCOUNTER — Ambulatory Visit: Payer: Medicare PPO | Admitting: Family Medicine

## 2021-03-07 ENCOUNTER — Other Ambulatory Visit: Payer: Self-pay

## 2021-03-07 ENCOUNTER — Encounter: Payer: Self-pay | Admitting: Family Medicine

## 2021-03-07 VITALS — BP 122/72 | HR 78 | Temp 97.8°F | Wt 123.8 lb

## 2021-03-07 DIAGNOSIS — Z23 Encounter for immunization: Secondary | ICD-10-CM | POA: Diagnosis not present

## 2021-03-07 DIAGNOSIS — I2510 Atherosclerotic heart disease of native coronary artery without angina pectoris: Secondary | ICD-10-CM | POA: Diagnosis not present

## 2021-03-07 DIAGNOSIS — Z79899 Other long term (current) drug therapy: Secondary | ICD-10-CM

## 2021-03-07 DIAGNOSIS — B351 Tinea unguium: Secondary | ICD-10-CM | POA: Insufficient documentation

## 2021-03-07 DIAGNOSIS — I700 Atherosclerosis of aorta: Secondary | ICD-10-CM

## 2021-03-07 DIAGNOSIS — G479 Sleep disorder, unspecified: Secondary | ICD-10-CM

## 2021-03-07 DIAGNOSIS — E785 Hyperlipidemia, unspecified: Secondary | ICD-10-CM | POA: Diagnosis not present

## 2021-03-07 DIAGNOSIS — N1831 Chronic kidney disease, stage 3a: Secondary | ICD-10-CM

## 2021-03-07 DIAGNOSIS — Z21 Asymptomatic human immunodeficiency virus [HIV] infection status: Secondary | ICD-10-CM

## 2021-03-07 DIAGNOSIS — L4050 Arthropathic psoriasis, unspecified: Secondary | ICD-10-CM

## 2021-03-07 DIAGNOSIS — Z9889 Other specified postprocedural states: Secondary | ICD-10-CM

## 2021-03-07 DIAGNOSIS — I10 Essential (primary) hypertension: Secondary | ICD-10-CM

## 2021-03-07 MED ORDER — CELECOXIB 200 MG PO CAPS: 200.0000 mg | ORAL_CAPSULE | Freq: Two times a day (BID) | ORAL | 1 refills | Status: DC

## 2021-03-07 MED ORDER — BIKTARVY 50-200-25 MG PO TABS: 1.0000 | ORAL_TABLET | Freq: Every day | ORAL | 3 refills | Status: DC

## 2021-03-07 MED ORDER — LOSARTAN POTASSIUM-HCTZ 50-12.5 MG PO TABS
1.0000 | ORAL_TABLET | Freq: Every day | ORAL | 3 refills | Status: DC
Start: 2021-03-07 — End: 2022-02-27

## 2021-03-07 MED ORDER — ROSUVASTATIN CALCIUM 20 MG PO TABS: 20.0000 mg | ORAL_TABLET | Freq: Every day | ORAL | 3 refills | Status: DC

## 2021-03-07 NOTE — Progress Notes (Signed)
° °  Subjective:    Patient ID: [name], male    DOB: 1945/06/27, 75 y.o.   MRN: [id number]  HPI He is here for an interval evaluation.  He has no particular concerns or complaints other than family stress related to his sister's husband who is now in an assisted living facility.  Does have difficulty with sleep and does use Belsomra on an as-needed basis.  He continues on Biktarvy and is having no fever, chills, weight change.  He has had no chest pain, shortness of breath, PND or DOE.  Continues on Crestor as well as his losartan.  Celebrex is helping with general arthritis as well as psoriatic arthritis.  He did have some surgery this summer but at this point is doing well using Celebrex for general aches and pains.  Does have x-ray evidence of aortic atherosclerosis.  Review of blood work actually shows evidence of CKD stage III   Review of Systems     Objective:   Physical Exam Alert and in no distress. Tympanic membranes and canals are normal. Pharyngeal area is normal. Neck is supple without adenopathy or thyromegaly. Cardiac exam shows a regular sinus rhythm without murmurs or gallops. Lungs are clear to auscultation. On exam of hands and feet does show arthritic changes with some ulnar deviation of his hands.  He also has thickened nails and has seen podiatry in the past for that.       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  Atherosclerosis of aorta ([name])  Sleep disturbance  ASHD (arteriosclerotic heart disease) - Plan: CBC with Differential/Platelet, Comprehensive metabolic panel, Lipid panel  Encounter for long-term (current) use of high-risk medication - Plan:  CBC with Differential/Platelet, Comprehensive metabolic panel, HIV-1 RNA quant-no reflex-bld, Lipid panel, T-helper cells (CD4) count (not at [name])  Psoriatic arthritis ([name]) - Plan: celecoxib (CELEBREX) 200 MG capsule  Hyperlipidemia, unspecified hyperlipidemia type - Plan: Lipid panel, rosuvastatin (CRESTOR) 20 MG tablet  S/P laminectomy  Stage 3a chronic kidney disease ([name])  Onychomycosis  Essential hypertension - Plan: losartan-hydrochlorothiazide (HYZAAR) 50-12.5 MG tablet Continue on present medication regimen.  Also see podiatry for his feet as he also has some calluses on it.  Discussed his sleep disturbance with him and the use of Celebrex.  He seems to be using these appropriately.  Over 45 minutes spent in counseling and coordination of care

## 2021-03-08 LAB — COMPREHENSIVE METABOLIC PANEL
ALT: 13 IU/L (ref 0–44)
AST: 19 IU/L (ref 0–40)
Albumin/Globulin Ratio: 2.7 — ABNORMAL HIGH (ref 1.2–2.2)
Albumin: 4.9 g/dL — ABNORMAL HIGH (ref 3.7–4.7)
Alkaline Phosphatase: 74 IU/L (ref 44–121)
BUN/Creatinine Ratio: 15 (ref 10–24)
BUN: 28 mg/dL — ABNORMAL HIGH (ref 8–27)
Bilirubin Total: 0.5 mg/dL (ref 0.0–1.2)
CO2: 22 mmol/L (ref 20–29)
Calcium: 9.9 mg/dL (ref 8.6–10.2)
Chloride: 101 mmol/L (ref 96–106)
Creatinine, Ser: 1.91 mg/dL — ABNORMAL HIGH (ref 0.76–1.27)
Globulin, Total: 1.8 g/dL (ref 1.5–4.5)
Glucose: 92 mg/dL (ref 70–99)
Potassium: 4.7 mmol/L (ref 3.5–5.2)
Sodium: 138 mmol/L (ref 134–144)
Total Protein: 6.7 g/dL (ref 6.0–8.5)
eGFR: 36 mL/min/{1.73_m2} — ABNORMAL LOW (ref >59)

## 2021-03-08 LAB — T-HELPER CELLS (CD4) COUNT (NOT AT ARMC)
% CD 4 Pos. Lymph.: 38.7 % (ref 30.8–58.5)
Absolute CD 4 Helper: 387 /uL (ref 359–1519)
Basophils Absolute: 0 10*3/uL (ref 0.0–0.2)
Basos: 1 %
EOS (ABSOLUTE): 0.1 10*3/uL (ref 0.0–0.4)
Eos: 1 %
Hematocrit: 39.8 % (ref 37.5–51.0)
Hemoglobin: 13.9 g/dL (ref 13.0–17.7)
Immature Grans (Abs): 0 10*3/uL (ref 0.0–0.1)
Immature Granulocytes: 0 %
Lymphocytes Absolute: 1 10*3/uL (ref 0.7–3.1)
Lymphs: 16 %
MCH: 33 pg (ref 26.6–33.0)
MCHC: 34.9 g/dL (ref 31.5–35.7)
MCV: 95 fL (ref 79–97)
Monocytes Absolute: 0.3 10*3/uL (ref 0.1–0.9)
Monocytes: 5 %
Neutrophils Absolute: 4.7 10*3/uL (ref 1.4–7.0)
Neutrophils: 77 %
Platelets: 148 10*3/uL — ABNORMAL LOW (ref 150–450)
RBC: 4.21 x10E6/uL (ref 4.14–5.80)
RDW: 12.3 % (ref 11.6–15.4)
WBC: 6.1 10*3/uL (ref 3.4–10.8)

## 2021-03-08 LAB — LIPID PANEL
Chol/HDL Ratio: 2.8 ratio (ref 0.0–5.0)
Cholesterol, Total: 121 mg/dL (ref 100–199)
HDL: 43 mg/dL (ref >39)
LDL Chol Calc (NIH): 54 mg/dL (ref 0–99)
Triglycerides: 134 mg/dL (ref 0–149)
VLDL Cholesterol Cal: 24 mg/dL (ref 5–40)

## 2021-03-08 LAB — HIV-1 RNA QUANT-NO REFLEX-BLD: HIV-1 RNA Viral Load: <20 copies/mL

## 2021-03-22 ENCOUNTER — Ambulatory Visit (INDEPENDENT_AMBULATORY_CARE_PROVIDER_SITE_OTHER): Payer: Medicare PPO

## 2021-03-22 VITALS — Ht 67.0 in | Wt 125.0 lb

## 2021-03-22 DIAGNOSIS — Z0000 Encounter for general adult medical examination without abnormal findings: Secondary | ICD-10-CM | POA: Diagnosis not present

## 2021-03-22 NOTE — Patient Instructions (Signed)
[name] , Thank you for taking time to come for your Medicare Wellness Visit. I appreciate your ongoing commitment to your health goals. Please review the following plan we discussed and let me know if I can assist you in the future.   Screening recommendations/referrals: Colonoscopy: cologuard 11/08/2018 Recommended yearly ophthalmology/optometry visit for glaucoma screening and checkup Recommended yearly dental visit for hygiene and checkup  Vaccinations: Influenza vaccine: completed 12/28/2020 Pneumococcal vaccine: completed 03/24/2013 Tdap vaccine: completed 03/15/2012, due 03/15/2022 Shingles vaccine: completed   Covid-19:  03/07/2021, 12/20/2019, 05/11/2019, 04/17/2019  Advanced directives: Advance directive discussed with you today.   Conditions/risks identified: none  Next appointment: Follow up in one year for your annual wellness visit.   Preventive Care 76 Years and Older, Male Preventive care refers to lifestyle choices and visits with your health care provider that can promote health and wellness. What does preventive care include? A yearly physical exam. This is also called an annual well check. Dental exams once or twice a year. Routine eye exams. Ask your health care provider how often you should have your eyes checked. Personal lifestyle choices, including: Daily care of your teeth and gums. Regular physical activity. Eating a healthy diet. Avoiding tobacco and drug use. Limiting alcohol use. Practicing safe sex. Taking low doses of aspirin every day. Taking vitamin and mineral supplements as recommended by your health care provider. What happens during an annual well check? The services and screenings done by your health care provider during your annual well check will depend on your age, overall health, lifestyle risk factors, and family history of disease. Counseling  Your health care provider may ask you questions about your: Alcohol use. Tobacco use. Drug  use. Emotional well-being. Home and relationship well-being. Sexual activity. Eating habits. History of falls. Memory and ability to understand (cognition). Work and work Statistician. Screening  You may have the following tests or measurements: Height, weight, and BMI. Blood pressure. Lipid and cholesterol levels. These may be checked every 5 years, or more frequently if you are over 56 years old. Skin check. Lung cancer screening. You may have this screening every year starting at age 32 if you have a 30-pack-year history of smoking and currently smoke or have quit within the past 15 years. Fecal occult blood test (FOBT) of the stool. You may have this test every year starting at age 69. Flexible sigmoidoscopy or colonoscopy. You may have a sigmoidoscopy every 5 years or a colonoscopy every 10 years starting at age 3. Prostate cancer screening. Recommendations will vary depending on your family history and other risks. Hepatitis C blood test. Hepatitis B blood test. Sexually transmitted disease (STD) testing. Diabetes screening. This is done by checking your blood sugar (glucose) after you have not eaten for a while (fasting). You may have this done every 1-3 years. Abdominal aortic aneurysm (AAA) screening. You may need this if you are a current or former smoker. Osteoporosis. You may be screened starting at age 24 if you are at high risk. Talk with your health care provider about your test results, treatment options, and if necessary, the need for more tests. Vaccines  Your health care provider may recommend certain vaccines, such as: Influenza vaccine. This is recommended every year. Tetanus, diphtheria, and acellular pertussis (Tdap, Td) vaccine. You may need a Td booster every 10 years. Zoster vaccine. You may need this after age 67. Pneumococcal 13-valent conjugate (PCV13) vaccine. One dose is recommended after age 45. Pneumococcal polysaccharide (PPSV23) vaccine. One dose is  recommended after age 17. Talk to your health care provider about which screenings and vaccines you need and how often you need them. This information is not intended to replace advice given to you by your health care provider. Make sure you discuss any questions you have with your health care provider. Document Released: 03/23/2015 Document Revised: 11/14/2015 Document Reviewed: 12/26/2014 Elsevier Interactive Patient Education  2017 [employer] Prevention in the Home Falls can cause injuries. They can happen to people of all ages. There are many things you can do to make your home safe and to help prevent falls. What can I do on the outside of my home? Regularly fix the edges of walkways and driveways and fix any cracks. Remove anything that might make you trip as you walk through a door, such as a raised step or threshold. Trim any bushes or trees on the path to your home. Use bright outdoor lighting. Clear any walking paths of anything that might make someone trip, such as rocks or tools. Regularly check to see if handrails are loose or broken. Make sure that both sides of any steps have handrails. Any raised decks and porches should have guardrails on the edges. Have any leaves, snow, or ice cleared regularly. Use sand or salt on walking paths during winter. Clean up any spills in your garage right away. This includes oil or grease spills. What can I do in the bathroom? Use night lights. Install grab bars by the toilet and in the tub and shower. Do not use towel bars as grab bars. Use non-skid mats or decals in the tub or shower. If you need to sit down in the shower, use a plastic, non-slip stool. Keep the floor dry. Clean up any water that spills on the floor as soon as it happens. Remove soap buildup in the tub or shower regularly. Attach bath mats securely with double-sided non-slip rug tape. Do not have throw rugs and other things on the floor that can make you  trip. What can I do in the bedroom? Use night lights. Make sure that you have a light by your bed that is easy to reach. Do not use any sheets or blankets that are too big for your bed. They should not hang down onto the floor. Have a firm chair that has side arms. You can use this for support while you get dressed. Do not have throw rugs and other things on the floor that can make you trip. What can I do in the kitchen? Clean up any spills right away. Avoid walking on wet floors. Keep items that you use a lot in easy-to-reach places. If you need to reach something above you, use a strong step stool that has a grab bar. Keep electrical cords out of the way. Do not use floor polish or wax that makes floors slippery. If you must use wax, use non-skid floor wax. Do not have throw rugs and other things on the floor that can make you trip. What can I do with my stairs? Do not leave any items on the stairs. Make sure that there are handrails on both sides of the stairs and use them. Fix handrails that are broken or loose. Make sure that handrails are as long as the stairways. Check any carpeting to make sure that it is firmly attached to the stairs. Fix any carpet that is loose or worn. Avoid having throw rugs at the top or bottom of the stairs. If you  do have throw rugs, attach them to the floor with carpet tape. Make sure that you have a light switch at the top of the stairs and the bottom of the stairs. If you do not have them, ask someone to add them for you. What else can I do to help prevent falls? Wear shoes that: Do not have high heels. Have rubber bottoms. Are comfortable and fit you well. Are closed at the toe. Do not wear sandals. If you use a stepladder: Make sure that it is fully opened. Do not climb a closed stepladder. Make sure that both sides of the stepladder are locked into place. Ask someone to hold it for you, if possible. Clearly mark and make sure that you can  see: Any grab bars or handrails. First and last steps. Where the edge of each step is. Use tools that help you move around (mobility aids) if they are needed. These include: Canes. Walkers. Scooters. Crutches. Turn on the lights when you go into a dark area. Replace any light bulbs as soon as they burn out. Set up your furniture so you have a clear path. Avoid moving your furniture around. If any of your floors are uneven, fix them. If there are any pets around you, be aware of where they are. Review your medicines with your doctor. Some medicines can make you feel dizzy. This can increase your chance of falling. Ask your doctor what other things that you can do to help prevent falls. This information is not intended to replace advice given to you by your health care provider. Make sure you discuss any questions you have with your health care provider. Document Released: 12/21/2008 Document Revised: 08/02/2015 Document Reviewed: 03/31/2014 Elsevier Interactive Patient Education  2017 [employer].

## 2021-03-22 NOTE — Progress Notes (Signed)
I connected with [name] today by telephone and verified that I am speaking with the correct person using two identifiers. Location patient: home Location provider: work Persons participating in the virtual visit: [name], [name].   I discussed the limitations, risks, security and privacy concerns of performing an evaluation and management service by telephone and the availability of in person appointments. I also discussed with the patient that there may be a patient responsible charge related to this service. The patient expressed understanding and verbally consented to this telephonic visit.    Interactive audio and video telecommunications were attempted between this provider and patient, however failed, due to patient having technical difficulties OR patient did not have access to video capability.  We continued and completed visit with audio only.     Vital signs may be patient reported or missing.  Subjective:   [name] is a 76 y.o. male who presents for Medicare Annual/Subsequent preventive examination.  Review of Systems     Cardiac Risk Factors include: advanced age (>45men, >38 women);dyslipidemia;male gender     Objective:    Today's Vitals   03/22/21 0811  Weight: 125 lb (56.7 kg)  Height: 5\' 7"  (1.702 m)   Body mass index is 19.58 kg/m.  Advanced Directives 03/22/2021 09/07/2020 03/13/2020 02/16/2019 09/16/2018 05/20/2017 09/25/2015  Does Patient Have a Medical Advance Directive? No No No Yes Yes Yes No  Type of Advance Directive - - Public librarian;Living will Living will;Healthcare Power of [place];Living will -  Does patient want to make changes to medical advance directive? - - - No - Patient declined No - Patient declined - -  Copy of [place] in Chart? - - - - No - copy requested - -  Would patient like information on creating a medical advance directive? - - Yes  (ED - Information included in AVS) - - - Yes - Educational materials given    Current Medications (verified) Outpatient Encounter Medications as of 03/22/2021  Medication Sig   acetaminophen (TYLENOL) 500 MG tablet Take 1,000 mg by mouth every 8 (eight) hours as needed for moderate pain.   BELSOMRA 10 MG TABS TAKE 1 TABLET BY MOUTH EVERY DAY AT BEDTIME AS NEEDED   bictegravir-emtricitabine-tenofovir AF (BIKTARVY) 50-200-25 MG TABS tablet Take 1 tablet by mouth daily.   celecoxib (CELEBREX) 200 MG capsule Take 1 capsule (200 mg total) by mouth 2 (two) times daily.   losartan-hydrochlorothiazide (HYZAAR) 50-12.5 MG tablet Take 1 tablet by mouth daily.   Multiple Vitamins-Minerals (MULTIVITAMIN WITH MINERALS) tablet Take 1 tablet by mouth daily.   nitroGLYCERIN (NITROSTAT) 0.3 MG SL tablet Place 1 tablet (0.3 mg total) under the tongue every 5 (five) minutes as needed for chest pain.   rosuvastatin (CRESTOR) 20 MG tablet Take 1 tablet (20 mg total) by mouth daily.   tadalafil (CIALIS) 5 MG tablet TAKE 1 TABLET BY MOUTH DAILY AS NEEDED FOR ERECTILE DYSFUNCTION.   valACYclovir (VALTREX) 1000 MG tablet 2 tablets twice a day for 1 day (Patient taking differently: Take 1,000 mg by mouth daily as needed (outbreaks).)   dronabinol (MARINOL) 2.5 MG capsule Take 1 capsule (2.5 mg total) by mouth 2 (two) times daily before a meal. (Patient not taking: Reported on 03/22/2021)   oxyCODONE-acetaminophen (PERCOCET/ROXICET) 5-325 MG tablet Take 1 tablet by mouth every 4 (four) hours as needed for severe pain. (Patient not taking: Reported on 03/22/2021)   No facility-administered  encounter medications on file as of 03/22/2021.    Allergies (verified) Patient has no known allergies.   History: Past Medical History:  Diagnosis Date   Allergy    RHINITIS   Arthritis    ASHD (arteriosclerotic heart disease)    Former smoker QUIT 08/09/2010   Herpes labialis    HH (hiatus hernia)    History of kidney stones     HIV infection ([place])    POSITIVE   Hypertriglyceridemia    Psoriasis    ARTHRITIS   Renal stones    Tinnitus    CHRONIC   Past Surgical History:  Procedure Laterality Date   CORONARY ARTERY BYPASS GRAFT  08/20/1998   CABG x4: LIMA-LAD, SVG-Intermediate, SVG-distal CX, SVG-PDA   LUMBAR LAMINECTOMY/DECOMPRESSION MICRODISCECTOMY Left 10/01/2020   Procedure: Laminectomy - left - Lumbar three-Lumbar four - Lumbar four-Lumbar five microdiskectomy;  Surgeon: Eustace Moore, MD;  Location: [place];  Service: Neurosurgery;  Laterality: Left;   Family History  Problem Relation Age of Onset   Coronary artery disease Mother    Coronary artery disease Father    CAD Brother    Social History   Socioeconomic History   Marital status: Single    Spouse name: Not on file   Number of children: Not on file   Years of education: Not on file   Highest education level: Not on file  Occupational History   Not on file  Tobacco Use   Smoking status: Some Days    Packs/day: 0.25    Types: Cigarettes   Smokeless tobacco: Never  Vaping Use   Vaping Use: Never used  Substance and Sexual Activity   Alcohol use: Yes    Alcohol/week: 4.0 standard drinks    Types: 4 Glasses of wine per week    Comment: occasional   Drug use: No   Sexual activity: Yes  Other Topics Concern   Not on file  Social History Narrative   Not on file   Social Determinants of Health   Financial Resource Strain: Low Risk    Difficulty of Paying Living Expenses: Not hard at all  Food Insecurity: No Food Insecurity   Worried About Charity fundraiser in the Last Year: Never true   [place] in the Last Year: Never true  Transportation Needs: No Transportation Needs   Lack of Transportation (Medical): No   Lack of Transportation (Non-Medical): No  Physical Activity: Inactive   Days of Exercise per Week: 0 days   Minutes of Exercise per Session: 0 min  Stress: No Stress Concern Present   Feeling of Stress : Not  at all  Social Connections: Not on file    Tobacco Counseling Ready to quit: Not Answered Counseling given: Not Answered   Clinical Intake:  Pre-visit preparation completed: Yes  Pain : No/denies pain     Nutritional Status: BMI of 19-24  Normal Nutritional Risks: None Diabetes: No  How often do you need to have someone help you when you read instructions, pamphlets, or other written materials from your doctor or pharmacy?: 1 - Never What is the last grade level you completed in school?: doctorate  Diabetic? no  Interpreter Needed?: No  Information entered by :: NAllen Allison   Activities of Daily Living In your present state of health, do you have any difficulty performing the following activities: 03/22/2021 10/01/2020  Hearing? N -  Vision? N -  Difficulty concentrating or making decisions? N -  Walking or  climbing stairs? N -  Dressing or bathing? N -  Doing errands, shopping? N N  Preparing Food and eating ? N -  Using the Toilet? N -  In the past six months, have you accidently leaked urine? N -  Do you have problems with loss of bowel control? N -  Managing your Medications? N -  Managing your Finances? N -  Housekeeping or managing your Housekeeping? N -  Some recent data might be hidden    Patient Care Team: Denita Lung, MD as PCP - General (Family Medicine) Nahser, Wonda Cheng, MD as PCP - Cardiology (Cardiology)  Indicate any recent Medical Services you may have received from other than [facility] providers in the past year (date may be approximate).     Assessment:   This is a routine wellness examination for [place].  Hearing/Vision screen Vision Screening - Comments:: Regular eye exams, [address]  Dietary issues and exercise activities discussed: Current Exercise Habits: The patient does not participate in regular exercise at present   Goals Addressed             This Visit's Progress    Patient Stated       03/22/2021, keep  walking       Depression Screen PHQ 2/9 Scores 03/22/2021 03/13/2020 12/20/2019 09/16/2018 09/29/2017 09/25/2016 09/25/2015  PHQ - 2 Score 0 0 0 0 0 0 0    Fall Risk Fall Risk  03/22/2021 12/20/2019 09/16/2018 09/29/2017 09/25/2016  Falls in the past year? 0 0 0 No No  Risk for fall due to : Impaired balance/gait;Medication side effect - - - -  Follow up Falls evaluation completed;Education provided;Falls prevention discussed - - - -    FALL RISK PREVENTION PERTAINING TO THE HOME:  Any stairs in or around the home? Yes  If so, are there any without handrails? No  Home free of loose throw rugs in walkways, pet beds, electrical cords, etc? Yes  Adequate lighting in your home to reduce risk of falls? Yes   ASSISTIVE DEVICES UTILIZED TO PREVENT FALLS:  Life alert? No  Use of a cane, walker or w/c? No  Grab bars in the bathroom? No  Shower chair or bench in shower? No  Elevated toilet seat or a handicapped toilet? Yes   TIMED UP AND GO:  Was the test performed? No .      Cognitive Function:     6CIT Screen 03/22/2021  What Year? 0 points  What month? 0 points  What time? 0 points  Count back from 20 0 points  Months in reverse 0 points  Repeat phrase 0 points  Total Score 0    Immunizations Immunization History  Administered Date(s) Administered   Fluad Quad(high Dose 65+) 11/29/2018, 12/20/2019, 12/28/2020   Influenza Split 01/15/2011, 03/15/2012   Influenza Whole 12/28/2006, 01/11/2009   Influenza, High Dose Seasonal PF 02/10/2013, 03/29/2014, 03/21/2015, 04/03/2016, 03/24/2017   PFIZER(Purple Top)SARS-COV-2 Vaccination 04/17/2019, 05/11/2019, 12/20/2019   Pfizer Covid-19 Vaccine Bivalent Booster 72yrs & up 03/07/2021   Pneumococcal Conjugate-13 02/12/2005   Pneumococcal Polysaccharide-23 03/24/2013   Tdap 03/15/2012   Zoster Recombinat (Shingrix) 01/09/2019, 07/11/2019    TDAP status: Up to date  Flu Vaccine status: Up to date  Pneumococcal vaccine status: Up to  date  Covid-19 vaccine status: Completed vaccines  Qualifies for Shingles Vaccine? Yes   Zostavax completed No   Shingrix Completed?: Yes  Screening Tests Health Maintenance  Topic Date Due   Fecal DNA (  Cologuard)  11/07/2021   TETANUS/TDAP  03/15/2022   Pneumonia Vaccine 84+ Years old  Completed   INFLUENZA VACCINE  Completed   COVID-19 Vaccine  Completed   Hepatitis C Screening  Completed   Zoster Vaccines- Shingrix  Completed   HPV VACCINES  Aged Out    Health Maintenance  There are no preventive care reminders to display for this patient.   Colorectal cancer screening: Type of screening: Cologuard. Completed 11/07/2020. Repeat every 3 years  Lung Cancer Screening: (Low Dose CT Chest recommended if Age 17-80 years, 30 pack-year currently smoking OR have quit w/in 15years.) does not qualify.   Lung Cancer Screening Referral: no  Additional Screening:  Hepatitis C Screening: does qualify; Completed 09/25/2015  Vision Screening: Recommended annual ophthalmology exams for early detection of glaucoma and other disorders of the eye. Is the patient up to date with their annual eye exam?  Yes  Who is the provider or what is the name of the office in which the patient attends annual eye exams? [facility] If pt is not established with a provider, would they like to be referred to a provider to establish care? No .   Dental Screening: Recommended annual dental exams for proper oral hygiene  Community Resource Referral / Chronic Care Management: CRR required this visit?  No   CCM required this visit?  No      Plan:     I have personally reviewed and noted the following in the patients chart:   Medical and social history Use of alcohol, tobacco or illicit drugs  Current medications and supplements including opioid prescriptions. Patient is not currently taking opioid prescriptions. Functional ability and status Nutritional status Physical activity Advanced  directives List of other physicians Hospitalizations, surgeries, and ER visits in previous 12 months Vitals Screenings to include cognitive, depression, and falls Referrals and appointments  In addition, I have reviewed and discussed with patient certain preventive protocols, quality metrics, and best practice recommendations. A written personalized care plan for preventive services as well as general preventive health recommendations were provided to patient.     Kellie Simmering, Allison   [id number]   Nurse Notes: none

## 2021-03-29 ENCOUNTER — Other Ambulatory Visit: Payer: Self-pay | Admitting: Family Medicine

## 2021-03-29 DIAGNOSIS — G479 Sleep disorder, unspecified: Secondary | ICD-10-CM

## 2021-03-31 ENCOUNTER — Other Ambulatory Visit: Payer: Self-pay | Admitting: Family Medicine

## 2021-03-31 DIAGNOSIS — N521 Erectile dysfunction due to diseases classified elsewhere: Secondary | ICD-10-CM

## 2021-04-30 ENCOUNTER — Other Ambulatory Visit: Payer: Self-pay | Admitting: Family Medicine

## 2021-04-30 DIAGNOSIS — N521 Erectile dysfunction due to diseases classified elsewhere: Secondary | ICD-10-CM

## 2021-04-30 NOTE — Telephone Encounter (Signed)
[facility] is requesting to fill pt tadalifil. Please advise [facility]

## 2021-05-27 ENCOUNTER — Other Ambulatory Visit: Payer: Self-pay | Admitting: Family Medicine

## 2021-05-27 DIAGNOSIS — L4050 Arthropathic psoriasis, unspecified: Secondary | ICD-10-CM

## 2021-05-27 NOTE — Telephone Encounter (Signed)
[facility] is requesting to fill pt celebrex. Please advise [place] ?

## 2021-08-07 ENCOUNTER — Other Ambulatory Visit: Payer: Self-pay | Admitting: Family Medicine

## 2021-08-07 DIAGNOSIS — G479 Sleep disorder, unspecified: Secondary | ICD-10-CM

## 2021-08-07 NOTE — Telephone Encounter (Signed)
[facility] is requesting to fill pt belsomra. Please advise [facility]

## 2021-09-02 ENCOUNTER — Ambulatory Visit: Payer: Medicare PPO | Admitting: Family Medicine

## 2021-09-16 ENCOUNTER — Ambulatory Visit: Payer: Medicare PPO | Admitting: Family Medicine

## 2021-09-16 VITALS — BP 138/70 | HR 59 | Temp 96.8°F | Wt 121.6 lb

## 2021-09-16 DIAGNOSIS — I2510 Atherosclerotic heart disease of native coronary artery without angina pectoris: Secondary | ICD-10-CM

## 2021-09-16 DIAGNOSIS — I700 Atherosclerosis of aorta: Secondary | ICD-10-CM

## 2021-09-16 DIAGNOSIS — K219 Gastro-esophageal reflux disease without esophagitis: Secondary | ICD-10-CM | POA: Diagnosis not present

## 2021-09-16 DIAGNOSIS — Z21 Asymptomatic human immunodeficiency virus [HIV] infection status: Secondary | ICD-10-CM | POA: Diagnosis not present

## 2021-09-16 DIAGNOSIS — E785 Hyperlipidemia, unspecified: Secondary | ICD-10-CM | POA: Diagnosis not present

## 2021-09-16 DIAGNOSIS — L4050 Arthropathic psoriasis, unspecified: Secondary | ICD-10-CM | POA: Diagnosis not present

## 2021-09-16 DIAGNOSIS — J301 Allergic rhinitis due to pollen: Secondary | ICD-10-CM

## 2021-09-16 DIAGNOSIS — N1831 Chronic kidney disease, stage 3a: Secondary | ICD-10-CM | POA: Diagnosis not present

## 2021-09-16 DIAGNOSIS — I77811 Abdominal aortic ectasia: Secondary | ICD-10-CM

## 2021-09-16 MED ORDER — DOVATO 50-300 MG PO TABS: 1.0000 | ORAL_TABLET | Freq: Every day | ORAL | 3 refills | Status: DC

## 2021-09-16 NOTE — Progress Notes (Signed)
   Subjective:    Patient ID: [name], male    DOB: 07/15/1945, 76 y.o.   MRN: [id number]  HPI He is here for recheck.  He continues on [place] but is interested in switching to a different medication thinking that this might be contributing to his weight loss.  He has had no nausea, vomiting, abdominal pain, diarrhea.  He does have underlying ASHD and is doing quite nicely with that.  Presently he is taking Hyzaar and having no difficulty with that.   He does have evidence of aortic atherosclerosis as well as an ectatic aorta.  Does occasionally use Cialis.  He uses Celebrex regularly for his underlying psoriatic arthritis.  Also uses Belsomra to help with sleep issues. He continues to have difficulty with back pain in spite of surgery.  He does have underlying allergies which seem to be under good control.  Reflux is causing very little trouble. The medical record was reviewed as well as health maintenance and immunizations. Review of Systems     Objective:   Physical Exam Alert and in no distress. Tympanic membranes and canals are normal. Pharyngeal area is normal. Neck is supple without adenopathy or thyromegaly. Cardiac exam shows a regular sinus rhythm without murmurs or gallops. Lungs are clear to auscultation.  Exam of the hands does show ulnar deviation at the MCP joints.        Assessment & Plan:  HIV positive ([place]) - Plan: CBC with Differential/Platelet, Comprehensive metabolic panel, HIV-1 RNA quant-no reflex-bld, Lipid panel, T-helper cells (CD4) count (not at [facility]), dolutegravir-lamiVUDine (DOVATO) 50-300 MG tablet  ASHD (arteriosclerotic heart disease) - Plan: CBC with Differential/Platelet, Comprehensive metabolic panel, Lipid panel  Atherosclerosis of aorta (HCC) - Plan: Lipid panel  Ectatic abdominal aorta (HCC)  Seasonal allergic rhinitis due to pollen  Gastroesophageal reflux disease without esophagitis  Psoriatic arthritis (HCC)  Stage 3a chronic  kidney disease (HCC)  Hyperlipidemia, unspecified hyperlipidemia type - Plan: Lipid panel I will switch him to Dovato to see if that helps with his weight and then move onto A new but if this does not work.  He was comfortable with that approach.  He will continue to be followed by cardiology.  Continue on his other medications.

## 2021-09-17 ENCOUNTER — Telehealth: Payer: Self-pay

## 2021-09-17 LAB — LIPID PANEL
Chol/HDL Ratio: 3.9 ratio (ref 0.0–5.0)
Cholesterol, Total: 153 mg/dL (ref 100–199)
HDL: 39 mg/dL — ABNORMAL LOW (ref >39)
LDL Chol Calc (NIH): 92 mg/dL (ref 0–99)
Triglycerides: 123 mg/dL (ref 0–149)
VLDL Cholesterol Cal: 22 mg/dL (ref 5–40)

## 2021-09-17 LAB — COMPREHENSIVE METABOLIC PANEL
ALT: 15 IU/L (ref 0–44)
AST: 20 IU/L (ref 0–40)
Albumin/Globulin Ratio: 2.5 — ABNORMAL HIGH (ref 1.2–2.2)
Albumin: 4.9 g/dL — ABNORMAL HIGH (ref 3.8–4.8)
Alkaline Phosphatase: 78 IU/L (ref 44–121)
BUN/Creatinine Ratio: 19 (ref 10–24)
BUN: 34 mg/dL — ABNORMAL HIGH (ref 8–27)
Bilirubin Total: 0.4 mg/dL (ref 0.0–1.2)
CO2: 23 mmol/L (ref 20–29)
Calcium: 10 mg/dL (ref 8.6–10.2)
Chloride: 99 mmol/L (ref 96–106)
Creatinine, Ser: 1.83 mg/dL — ABNORMAL HIGH (ref 0.76–1.27)
Globulin, Total: 2 g/dL (ref 1.5–4.5)
Glucose: 83 mg/dL (ref 70–99)
Potassium: 5.2 mmol/L (ref 3.5–5.2)
Sodium: 140 mmol/L (ref 134–144)
Total Protein: 6.9 g/dL (ref 6.0–8.5)
eGFR: 38 mL/min/{1.73_m2} — ABNORMAL LOW (ref >59)

## 2021-09-17 LAB — T-HELPER CELLS (CD4) COUNT (NOT AT ARMC)
% CD 4 Pos. Lymph.: 40.3 % (ref 30.8–58.5)
Absolute CD 4 Helper: 403 /uL (ref 359–1519)
Basophils Absolute: 0 10*3/uL (ref 0.0–0.2)
Basos: 0 %
EOS (ABSOLUTE): 0 10*3/uL (ref 0.0–0.4)
Eos: 1 %
Hematocrit: 42.1 % (ref 37.5–51.0)
Hemoglobin: 14.8 g/dL (ref 13.0–17.7)
Immature Grans (Abs): 0 10*3/uL (ref 0.0–0.1)
Immature Granulocytes: 0 %
Lymphocytes Absolute: 1 10*3/uL (ref 0.7–3.1)
Lymphs: 21 %
MCH: 34.3 pg — ABNORMAL HIGH (ref 26.6–33.0)
MCHC: 35.2 g/dL (ref 31.5–35.7)
MCV: 98 fL — ABNORMAL HIGH (ref 79–97)
Monocytes Absolute: 0.3 10*3/uL (ref 0.1–0.9)
Monocytes: 6 %
Neutrophils Absolute: 3.5 10*3/uL (ref 1.4–7.0)
Neutrophils: 72 %
Platelets: 122 10*3/uL — ABNORMAL LOW (ref 150–450)
RBC: 4.31 x10E6/uL (ref 4.14–5.80)
RDW: 12.6 % (ref 11.6–15.4)
WBC: 4.8 10*3/uL (ref 3.4–10.8)

## 2021-09-17 LAB — HIV-1 RNA QUANT-NO REFLEX-BLD: HIV-1 RNA Viral Load: <20 copies/mL

## 2021-09-17 NOTE — Telephone Encounter (Signed)
Pt. Called wanting to speak with you about the new medicine Dr. Redmond School called in for him yesterday. He wanted to know when he was supposed to stop the old medicine and how long to be off of it before starting his new Med Dovato. If you could call him back and let him know.

## 2021-09-18 NOTE — Telephone Encounter (Signed)
Pt was advised [facility] 

## 2021-09-18 NOTE — Telephone Encounter (Signed)
Lvm for pt to all back Kh

## 2021-10-07 ENCOUNTER — Encounter: Payer: Self-pay | Admitting: Cardiovascular Disease

## 2021-10-07 NOTE — Progress Notes (Unsigned)
Cardiology Office Note:    Date:  10/08/2021   ID:  [name], DOB 1946/01/10, MRN [id number]  PCP:  Gregory Lung, MD   [facility] Providers Cardiologist:  previouis [name], now [name]  Click to update primary MD,subspecialty MD or APP then REFRESH:1}    Referring MD: Gregory Lung, MD   Chief Complaint  Patient presents with   carotid bruit   Coronary Artery Disease         September 24, 2020   [name] is a 76 y.o. male with a hx of HIV, psoriatic arthritis ,  HTN , CABG    We are asked to see him for pre op evaluation by Dr. Ronnald Allison and Dr. Redmond Allison for pre op evaluation for his HTN piror to  back surgery   Has known HTN.  Is on Losartan- HCTZ Typically walks regularly . Now has spinal stenosis and is very limited   Previoius patient of [name] CABG in 2000 .   No recurrent symptoms  Has a family hx of CAD.     Aug. 1, 2023 [name] is seen for follow up of his CAD, CABG HTN, carotid artery disease.  Also has HIV disease Had back surgery last year. Walks 2-4 miles a day  No Cp , no dyspnea   Had 1 episode of profound dizziness while driving  Lasted for 10 minutes.  No CP now Walks without any issues   ECG shows a new RBBB and Inf. Q.waves inferiorly   I suspect he had a Inf. MI.  Will get an echo     Past Medical History:  Diagnosis Date   Allergy    RHINITIS   Arthritis    ASHD (arteriosclerotic heart disease)    Former smoker QUIT 08/09/2010   Herpes labialis    HH (hiatus hernia)    History of kidney stones    HIV infection ([place])    POSITIVE   Hypertriglyceridemia    Psoriasis    ARTHRITIS   Renal stones    Tinnitus    CHRONIC    Past Surgical History:  Procedure Laterality Date   CORONARY ARTERY BYPASS GRAFT  08/20/1998   CABG x4: LIMA-LAD, SVG-Intermediate, SVG-distal CX, SVG-PDA   LUMBAR LAMINECTOMY/DECOMPRESSION MICRODISCECTOMY Left 10/01/2020   Procedure: Laminectomy - left - Lumbar three-Lumbar  four - Lumbar four-Lumbar five microdiskectomy;  Surgeon: Gregory Moore, MD;  Location: [facility];  Service: Neurosurgery;  Laterality: Left;    Current Medications: Current Meds  Medication Sig   acetaminophen (TYLENOL) 500 MG tablet Take 1,000 mg by mouth every 8 (eight) hours as needed for moderate pain.   BELSOMRA 10 MG TABS TAKE 1 TABLET BY MOUTH EVERY DAY AT BEDTIME AS NEEDED   celecoxib (CELEBREX) 200 MG capsule TAKE 1 CAPSULE TWICE DAILY   dolutegravir-lamiVUDine (DOVATO) 50-300 MG tablet Take 1 tablet by mouth daily.   ezetimibe (ZETIA) 10 MG tablet Take 1 tablet (10 mg total) by mouth daily.   losartan-hydrochlorothiazide (HYZAAR) 50-12.5 MG tablet Take 1 tablet by mouth daily.   Multiple Vitamins-Minerals (MULTIVITAMIN WITH MINERALS) tablet Take 1 tablet by mouth daily.   nitroGLYCERIN (NITROSTAT) 0.3 MG SL tablet Place 1 tablet (0.3 mg total) under the tongue every 5 (five) minutes as needed for chest pain.   oxyCODONE-acetaminophen (PERCOCET/ROXICET) 5-325 MG tablet Take 1 tablet by mouth every 4 (four) hours as needed for severe pain.   rosuvastatin (CRESTOR) 20 MG tablet Take 1 tablet (  20 mg total) by mouth daily.   tadalafil (CIALIS) 5 MG tablet TAKE 1 TABLET BY MOUTH DAILY AS NEEDED FOR ERECTILE DYSFUNCTION.   valACYclovir (VALTREX) 1000 MG tablet 2 tablets twice a day for 1 day     Allergies:   Patient has no known allergies.   Social History   Socioeconomic History   Marital status: Single    Spouse name: Not on file   Number of children: Not on file   Years of education: Not on file   Highest education level: Not on file  Occupational History   Not on file  Tobacco Use   Smoking status: Some Days    Packs/day: 0.25    Types: Cigarettes   Smokeless tobacco: Never  Vaping Use   Vaping Use: Never used  Substance and Sexual Activity   Alcohol use: Yes    Alcohol/week: 4.0 standard drinks of alcohol    Types: 4 Glasses of wine per week    Comment: occasional    Drug use: No   Sexual activity: Yes  Other Topics Concern   Not on file  Social History Narrative   Not on file   Social Determinants of Health   Financial Resource Strain: Low Risk  (03/22/2021)   Overall Financial Resource Strain (CARDIA)    Difficulty of Paying Living Expenses: Not hard at all  Food Insecurity: No Food Insecurity (03/22/2021)   Hunger Vital Sign    Worried About Running Out of Food in the Last Year: Never true    Ran Out of Food in the Last Year: Never true  Transportation Needs: No Transportation Needs (03/22/2021)   PRAPARE - Hydrologist (Medical): No    Lack of Transportation (Non-Medical): No  Physical Activity: Inactive (03/22/2021)   Exercise Vital Sign    Days of Exercise per Week: 0 days    Minutes of Exercise per Session: 0 min  Stress: No Stress Concern Present (03/22/2021)   [place]    Feeling of Stress : Not at all  Social Connections: Not on file     Family History: The patient's family history includes CAD in his brother; Coronary artery disease in his father and mother.  ROS:   Please see the history of present illness.     All other systems reviewed and are negative.  EKGs/Labs/Other Studies Reviewed:    The following studies were reviewed today:   EKG: Aug. 1, 2023 ,  RBBB, old Inf. MI     Recent Labs: 09/16/2021: ALT 15; BUN 34; Creatinine, Ser 1.83; Hemoglobin 14.8; Platelets 122; Potassium 5.2; Sodium 140  Recent Lipid Panel    Component Value Date/Time   CHOL 153 09/16/2021 1137   TRIG 123 09/16/2021 1137   HDL 39 (L) 09/16/2021 1137   CHOLHDL 3.9 09/16/2021 1137   CHOLHDL 5.3 (H) 09/25/2016 0753   VLDL 40 (H) 09/25/2016 0753   LDLCALC 92 09/16/2021 1137     Risk Assessment/Calculations:           Physical Exam:    Physical Exam: Blood pressure 128/70, pulse 73, height '5\' 7"'$  (1.702 m), weight 123 lb (55.8 kg), SpO2 99  %.  GEN:  Well nourished, well developed in no acute distress HEENT: Normal NECK: No JVD; No carotid bruits LYMPHATICS: No lymphadenopathy CARDIAC: RRR , no murmurs, rubs, gallops RESPIRATORY:  Clear to auscultation without rales, wheezing or rhonchi  ABDOMEN: Soft, non-tender, non-distended MUSCULOSKELETAL:  No edema; No deformity  SKIN: Warm and dry NEUROLOGIC:  Alert and oriented x 3   ASSESSMENT:    1. ASHD (arteriosclerotic heart disease)   2. Mixed hyperlipidemia   3. Primary hypertension   4. Medication management     PLAN:      CAD :   Has a history of coronary artery bypass grafting in 2002.  I suspect that he had a occlusion of his SVG to RCA graft several months ago.  This was associated with profound dizziness. We will get an echocardiogram.  I have advised him to completely stop smoking as this will help reduce his risk of further coronary events.    2.  Hyperlipidemia:   His LDL is still a bit high.  We will add Zetia 10 mg a day to his rosuvastatin.  Check lipids and ALT in 3 months.  His goal LDL is 55.   3.  Near syncopal episode: Which had an episode of near syncope while driving.  The episode lasted for 10 to 15 minutes.  Was described as a sudden dizziness that made him pull his car over.  He does not recall any chest pain.  EKG today shows a new right bundle branch block with inferior Q waves.  I suspect that he had an inferior wall myocardial infarction likely due to occlusion of his SVG to the RCA.  We will get an echocardiogram to evaluate his LV function.  At this point I do not think there is anything additional that we need to do.  He is back walking and is asymptomatic.  Continue medical therapy.  Continue aspirin and aggressive lipid-lowering.     Medication Adjustments/Labs and Tests Ordered: Current medicines are reviewed at length with the patient today.  Concerns regarding medicines are outlined above.  Orders Placed This Encounter   Procedures   Lipid panel   ALT   EKG 12-Lead    Meds ordered this encounter  Medications   ezetimibe (ZETIA) 10 MG tablet    Sig: Take 1 tablet (10 mg total) by mouth daily.    Dispense:  90 tablet    Refill:  3      Patient Instructions  Medication Instructions:  START Zetia '10mg'$  daily (Ezetimibe) *If you need a refill on your cardiac medications before your next appointment, please call your pharmacy*   Lab Work: ALT, LIPIDS in 3 months If you have labs (blood work) drawn today and your tests are completely normal, you will receive your results only by: [place] (if you have MyChart) OR A paper copy in the mail If you have any lab test that is abnormal or we need to change your treatment, we will call you to review the results.   Testing/Procedures: NONE   Follow-Up: At [facility], you and your health needs are our priority.  As part of our continuing mission to provide you with exceptional heart care, we have created designated Provider Care Teams.  These Care Teams include your primary Cardiologist (physician) and Advanced Practice Providers (APPs -  Physician Assistants and Nurse Practitioners) who all work together to provide you with the care you need, when you need it.  Your next appointment:   1 year(s)  The format for your next appointment:   In Person  Provider:   Jacklynn Lewis or Kathlen Mody {     Important Information About Sugar         Signed, Mertie Moores, MD  10/08/2021 8:32 AM  [facility]

## 2021-10-08 ENCOUNTER — Ambulatory Visit: Payer: Medicare PPO | Admitting: Cardiovascular Disease

## 2021-10-08 ENCOUNTER — Encounter: Payer: Self-pay | Admitting: Cardiovascular Disease

## 2021-10-08 VITALS — BP 128/70 | HR 73 | Ht 67.0 in | Wt 123.0 lb

## 2021-10-08 DIAGNOSIS — E782 Mixed hyperlipidemia: Secondary | ICD-10-CM

## 2021-10-08 DIAGNOSIS — I2510 Atherosclerotic heart disease of native coronary artery without angina pectoris: Secondary | ICD-10-CM

## 2021-10-08 DIAGNOSIS — I10 Essential (primary) hypertension: Secondary | ICD-10-CM | POA: Diagnosis not present

## 2021-10-08 DIAGNOSIS — Z79899 Other long term (current) drug therapy: Secondary | ICD-10-CM | POA: Diagnosis not present

## 2021-10-08 MED ORDER — EZETIMIBE 10 MG PO TABS: 10.0000 mg | ORAL_TABLET | Freq: Every day | ORAL | 3 refills | Status: DC

## 2021-10-08 NOTE — Patient Instructions (Addendum)
Medication Instructions:  START Zetia '10mg'$  daily (Ezetimibe) *If you need a refill on your cardiac medications before your next appointment, please call your pharmacy*   Lab Work: ALT, LIPIDS in 3 months If you have labs (blood work) drawn today and your tests are completely normal, you will receive your results only by: [place] (if you have MyChart) OR A paper copy in the mail If you have any lab test that is abnormal or we need to change your treatment, we will call you to review the results.   Testing/Procedures: ECHO Your physician has requested that you have an echocardiogram. Echocardiography is a painless test that uses sound waves to create images of your heart. It provides your doctor with information about the size and shape of your heart and how well your heart's chambers and valves are working. This procedure takes approximately one hour. There are no restrictions for this procedure.    Follow-Up: At [facility], you and your health needs are our priority.  As part of our continuing mission to provide you with exceptional heart care, we have created designated Provider Care Teams.  These Care Teams include your primary Cardiologist (physician) and Advanced Practice Providers (APPs -  Physician Assistants and Nurse Practitioners) who all work together to provide you with the care you need, when you need it.  Your next appointment:   1 year(s)  The format for your next appointment:   In Person  Provider:   [place], [place] or Kathlen Mody {     Important Information About Sugar

## 2021-10-21 ENCOUNTER — Ambulatory Visit (HOSPITAL_COMMUNITY): Payer: Medicare PPO | Attending: Cardiology

## 2021-10-21 DIAGNOSIS — I2510 Atherosclerotic heart disease of native coronary artery without angina pectoris: Secondary | ICD-10-CM | POA: Diagnosis not present

## 2021-10-21 LAB — ECHOCARDIOGRAM COMPLETE
Area-P 1/2: 3.39 cm2
P 1/2 time: 871 msec
S' Lateral: 4.1 cm

## 2021-10-28 ENCOUNTER — Other Ambulatory Visit: Payer: Self-pay | Admitting: Family Medicine

## 2021-10-28 DIAGNOSIS — N521 Erectile dysfunction due to diseases classified elsewhere: Secondary | ICD-10-CM

## 2021-10-28 DIAGNOSIS — G479 Sleep disorder, unspecified: Secondary | ICD-10-CM

## 2021-10-28 NOTE — Telephone Encounter (Signed)
Refill request for Belsomra and Tadalafil last apt 09/16/21 next apt 04/01/22.

## 2021-10-29 ENCOUNTER — Telehealth: Payer: Self-pay | Admitting: Family Medicine

## 2021-10-29 NOTE — Telephone Encounter (Signed)
Pt asking for you to call him on his mobile number when you get a chance.

## 2021-10-30 NOTE — Telephone Encounter (Signed)
Spoke to pt and per his request saving card was placed up front for pick up. Pt was advised it may not work due to [employer]. Pt was also given a sample just in case. [place]

## 2021-11-06 DIAGNOSIS — L570 Actinic keratosis: Secondary | ICD-10-CM | POA: Diagnosis not present

## 2021-11-06 DIAGNOSIS — Z85828 Personal history of other malignant neoplasm of skin: Secondary | ICD-10-CM | POA: Diagnosis not present

## 2021-11-06 DIAGNOSIS — L578 Other skin changes due to chronic exposure to nonionizing radiation: Secondary | ICD-10-CM | POA: Diagnosis not present

## 2021-11-06 DIAGNOSIS — L409 Psoriasis, unspecified: Secondary | ICD-10-CM | POA: Diagnosis not present

## 2021-11-06 DIAGNOSIS — L821 Other seborrheic keratosis: Secondary | ICD-10-CM | POA: Diagnosis not present

## 2021-11-06 DIAGNOSIS — L814 Other melanin hyperpigmentation: Secondary | ICD-10-CM | POA: Diagnosis not present

## 2021-11-06 DIAGNOSIS — D225 Melanocytic nevi of trunk: Secondary | ICD-10-CM | POA: Diagnosis not present

## 2021-11-06 DIAGNOSIS — M25541 Pain in joints of right hand: Secondary | ICD-10-CM | POA: Diagnosis not present

## 2021-11-13 ENCOUNTER — Encounter: Payer: Self-pay | Admitting: Internal Medicine

## 2021-12-30 ENCOUNTER — Encounter: Payer: Self-pay | Admitting: Internal Medicine

## 2022-01-01 ENCOUNTER — Telehealth: Payer: Self-pay

## 2022-01-01 NOTE — Telephone Encounter (Signed)
Pt. Called stating his back is hurting pretty bad. He had surgery on it little bit over a year ago and he must have messed something up in it. He has been taking his Oxycodone that he had left over from his surgery and that has helped but he only has one left know and will take half a tablet of it today and tomorrow. I got him scheduled with you Monday because you are full tomorrow and Friday. He wanted to know if you could refill the oxycodone before his apt on Monday or had any other recommendations. He has already taking ibuprofen 800 mg three times daily and that did not help at all.

## 2022-01-03 ENCOUNTER — Encounter: Payer: Self-pay | Admitting: Family Medicine

## 2022-01-03 ENCOUNTER — Ambulatory Visit: Payer: Medicare PPO | Admitting: Family Medicine

## 2022-01-03 VITALS — BP 112/70 | HR 64 | Temp 98.5°F | Wt 123.0 lb

## 2022-01-03 DIAGNOSIS — Z9889 Other specified postprocedural states: Secondary | ICD-10-CM

## 2022-01-03 DIAGNOSIS — G8929 Other chronic pain: Secondary | ICD-10-CM | POA: Diagnosis not present

## 2022-01-03 DIAGNOSIS — M545 Low back pain, unspecified: Secondary | ICD-10-CM | POA: Diagnosis not present

## 2022-01-03 NOTE — Progress Notes (Signed)
   Subjective:    Patient ID: [name], male    DOB: 12/07/1945, 76 y.o.   MRN: [id number]  HPI He states that on Tuesday he was doing some yard work and was using 1 hand more so than both developing some right low back pain in the area where he has had previous surgery.  This was quite excruciating and did not respond initially to ibuprofen.  He does use codeine which did help.  He states today he is essentially 99% back to normal.   Review of Systems     Objective:   Physical Exam Alert and in no distress.  Full motion of the back with no tenderness to palpation.  Normal lumbar curve.       Assessment & Plan:  Chronic bilateral low back pain without sciatica  S/P laminectomy Since he is now back to normal, no further intervention needed.  Did discuss proper technique when using his yard tools.

## 2022-01-06 ENCOUNTER — Ambulatory Visit: Payer: Medicare PPO | Admitting: Family Medicine

## 2022-01-08 ENCOUNTER — Ambulatory Visit: Payer: Medicare PPO | Attending: Cardiovascular Disease

## 2022-01-08 ENCOUNTER — Other Ambulatory Visit: Payer: Self-pay | Admitting: Family Medicine

## 2022-01-08 DIAGNOSIS — E785 Hyperlipidemia, unspecified: Secondary | ICD-10-CM

## 2022-01-08 DIAGNOSIS — E782 Mixed hyperlipidemia: Secondary | ICD-10-CM

## 2022-01-08 DIAGNOSIS — Z79899 Other long term (current) drug therapy: Secondary | ICD-10-CM | POA: Diagnosis not present

## 2022-01-08 LAB — LIPID PANEL
Chol/HDL Ratio: 2.3 ratio (ref 0.0–5.0)
Cholesterol, Total: 97 mg/dL — ABNORMAL LOW (ref 100–199)
HDL: 43 mg/dL (ref >39)
LDL Chol Calc (NIH): 42 mg/dL (ref 0–99)
Triglycerides: 47 mg/dL (ref 0–149)
VLDL Cholesterol Cal: 12 mg/dL (ref 5–40)

## 2022-01-08 LAB — ALT: ALT: 12 IU/L (ref 0–44)

## 2022-02-03 IMAGING — CR DG CHEST 2V
2 series · 2 of 2 positions shown · non-contrast
Comparison: 05/02/2003

CLINICAL DATA: Preoperative respiratory evaluation for lumbar spine
surgery.

EXAM:
CHEST - 2 VIEW

[w chest pa]
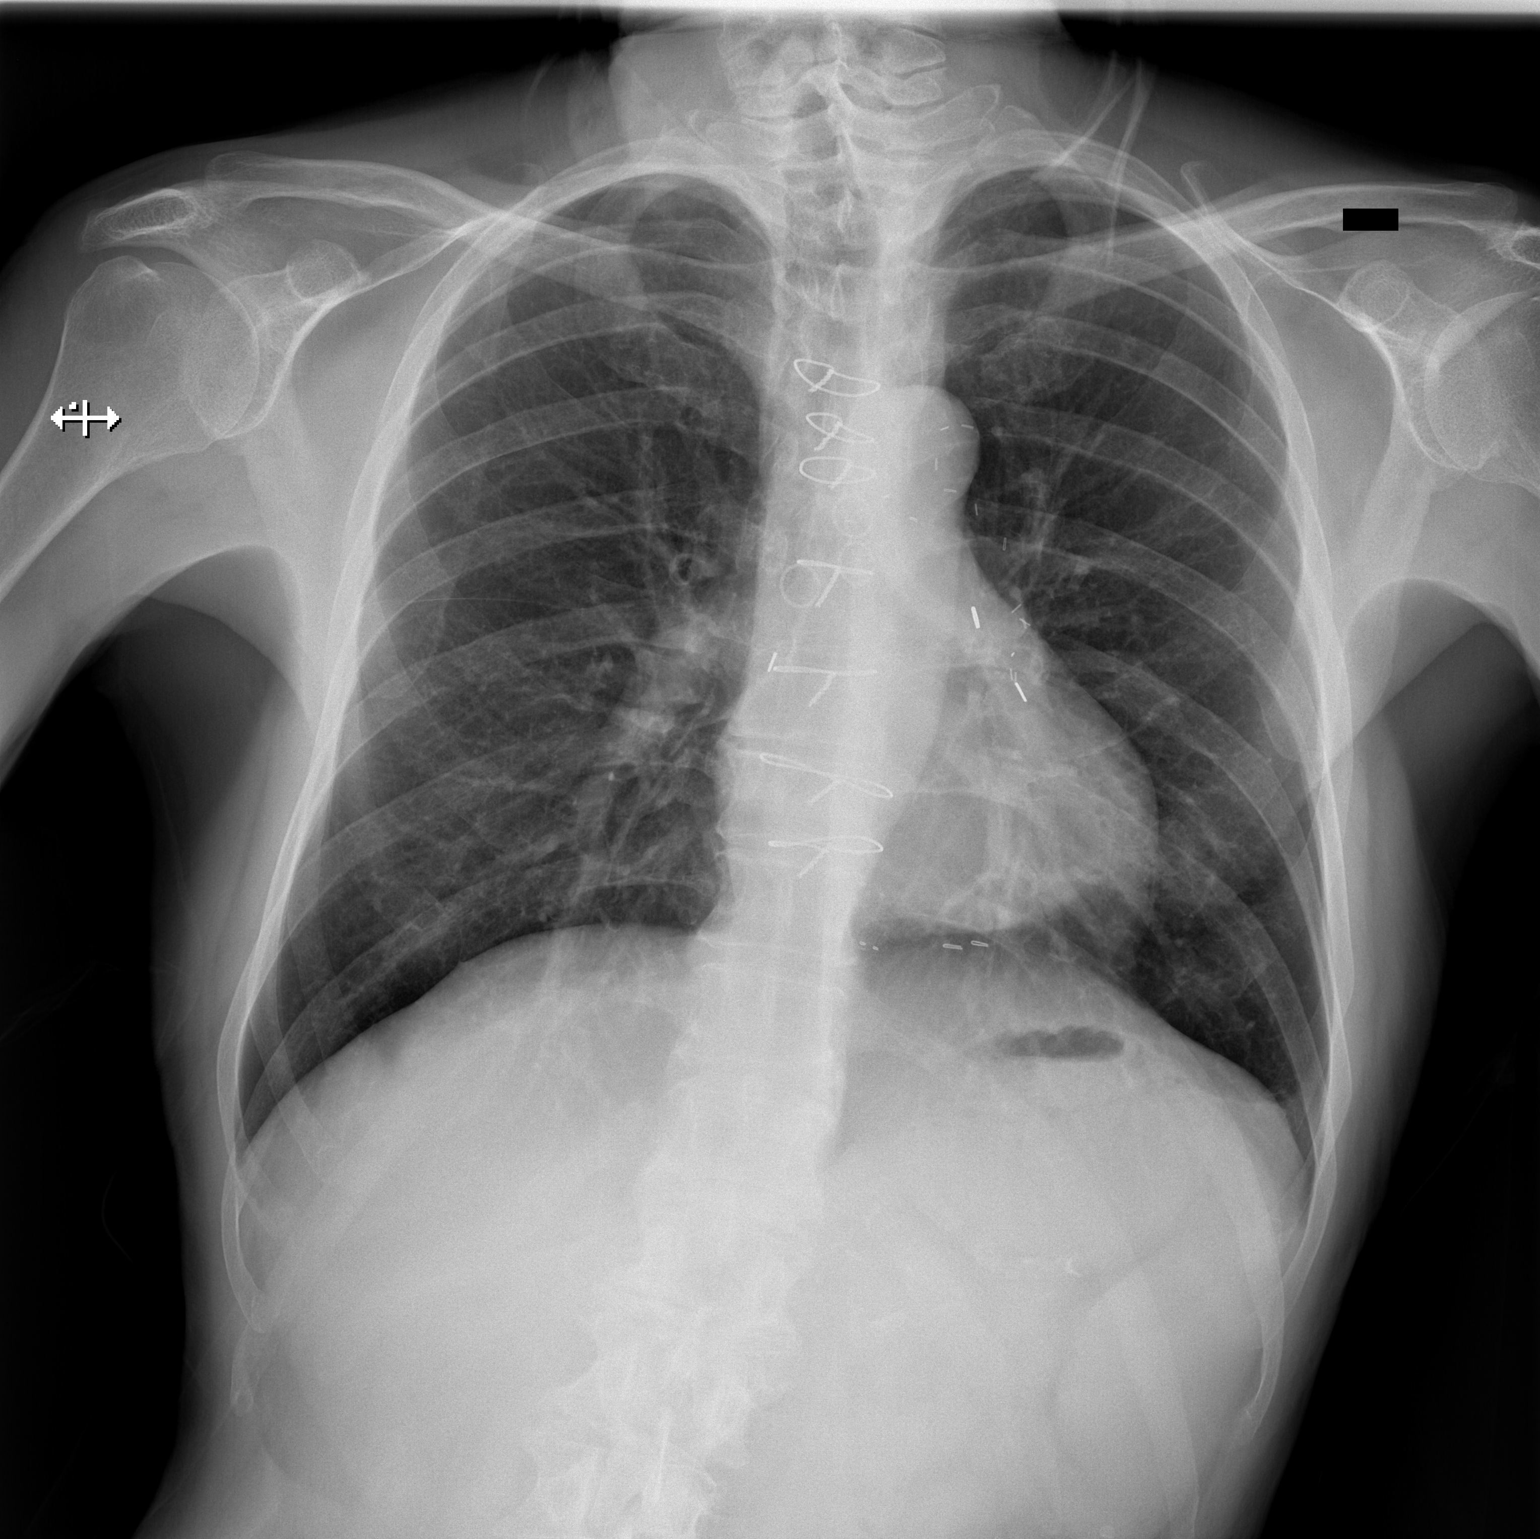

[w chest lat]
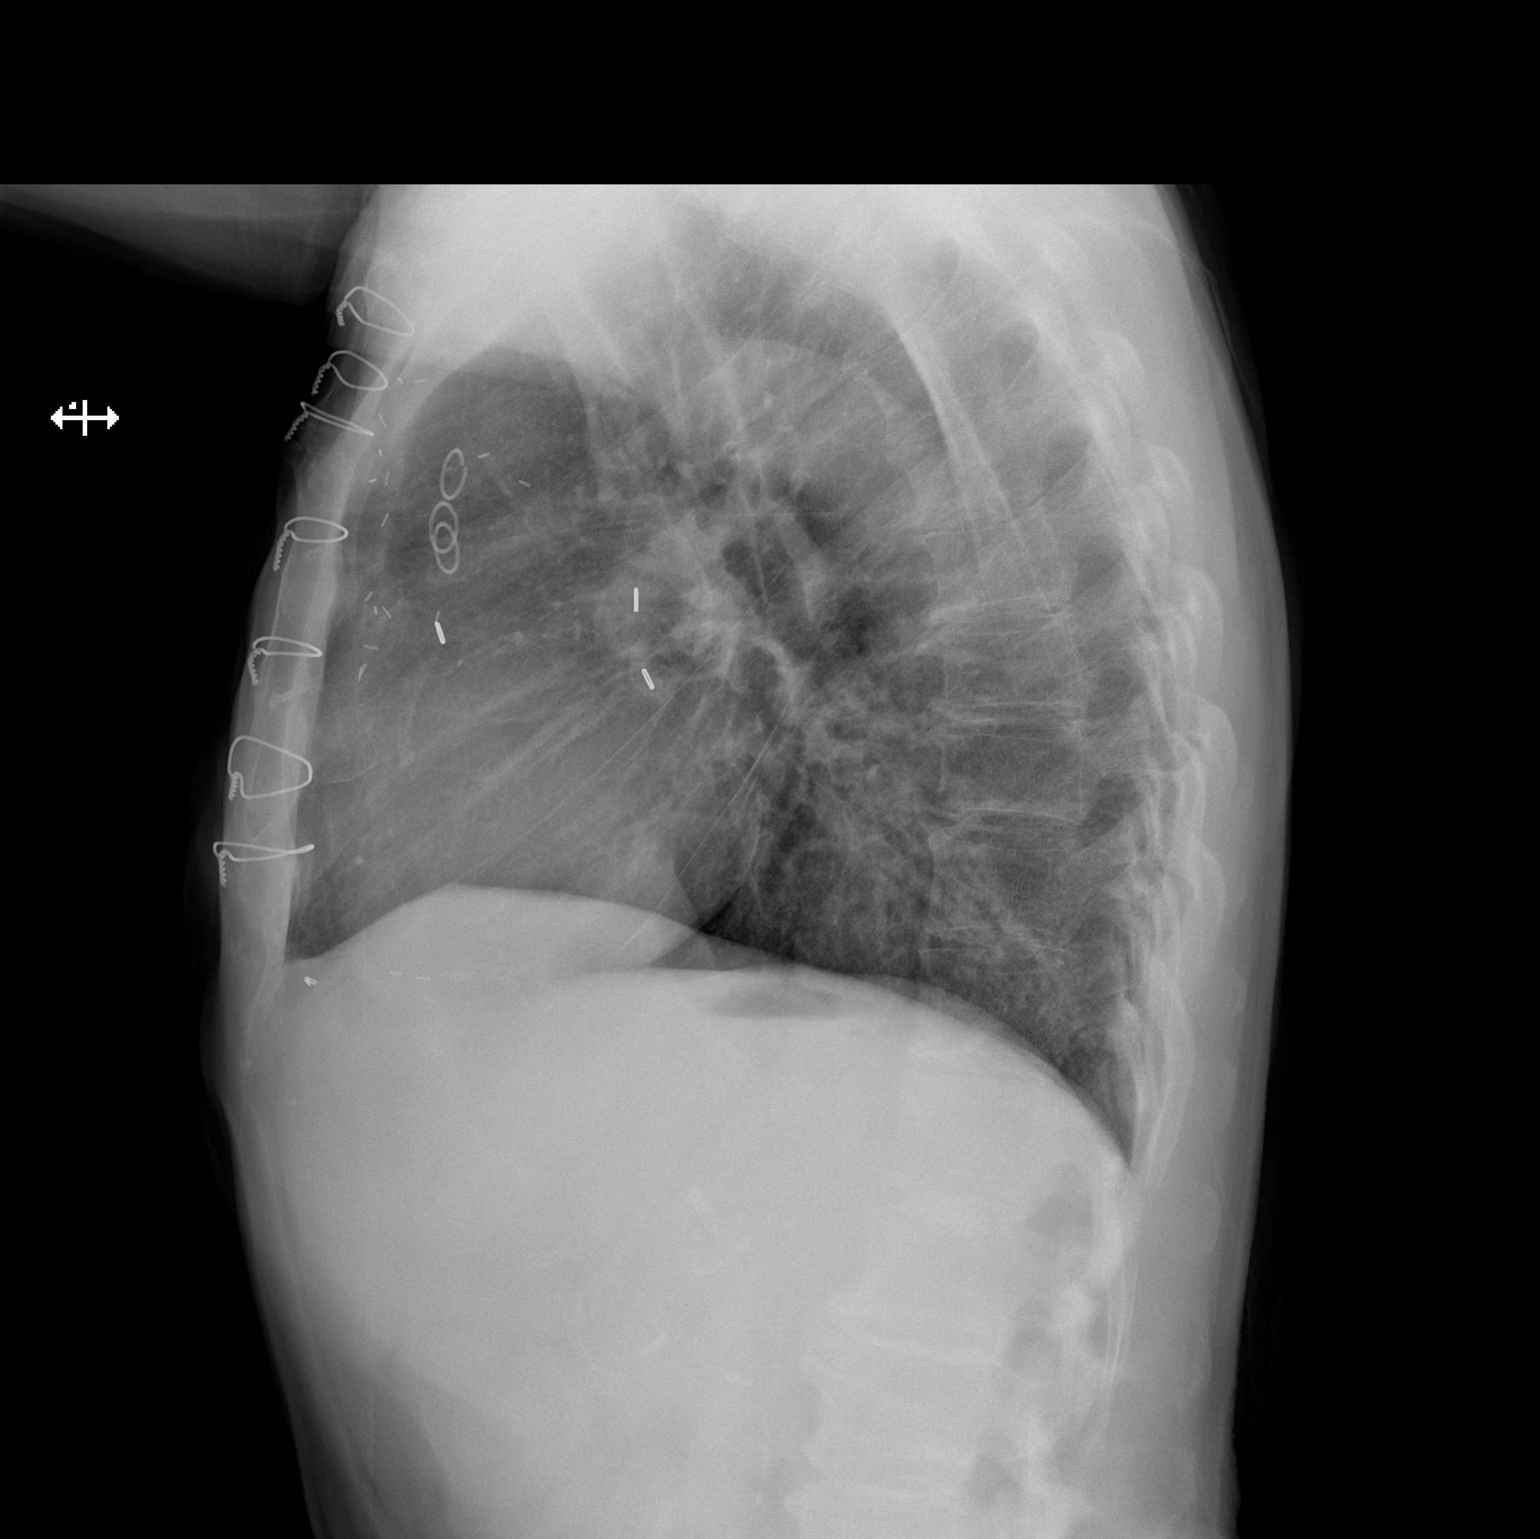

[2 of 2 positions shown; findings below may reference images not displayed]

FINDINGS: The lungs are clear without focal pneumonia, edema, pneumothorax or
pleural effusion. Status post CABG. The cardiopericardial silhouette
is within normal limits for size. The visualized bony structures of
the thorax show no acute abnormality.
IMPRESSION: No active cardiopulmonary disease.

## 2022-02-27 ENCOUNTER — Other Ambulatory Visit: Payer: Self-pay | Admitting: Family Medicine

## 2022-02-27 DIAGNOSIS — I10 Essential (primary) hypertension: Secondary | ICD-10-CM

## 2022-02-27 IMAGING — CR DG LUMBAR SPINE 1V
1 series · 1 of 1 positions shown · non-contrast
Comparison: Lumbar spine radiograph dated 11/29/2018 and MRI dated
12/23/2019.

CLINICAL DATA: 74-year-old male with lumbar localization.

EXAM:
LUMBAR SPINE - 1 VIEW

[lateral]
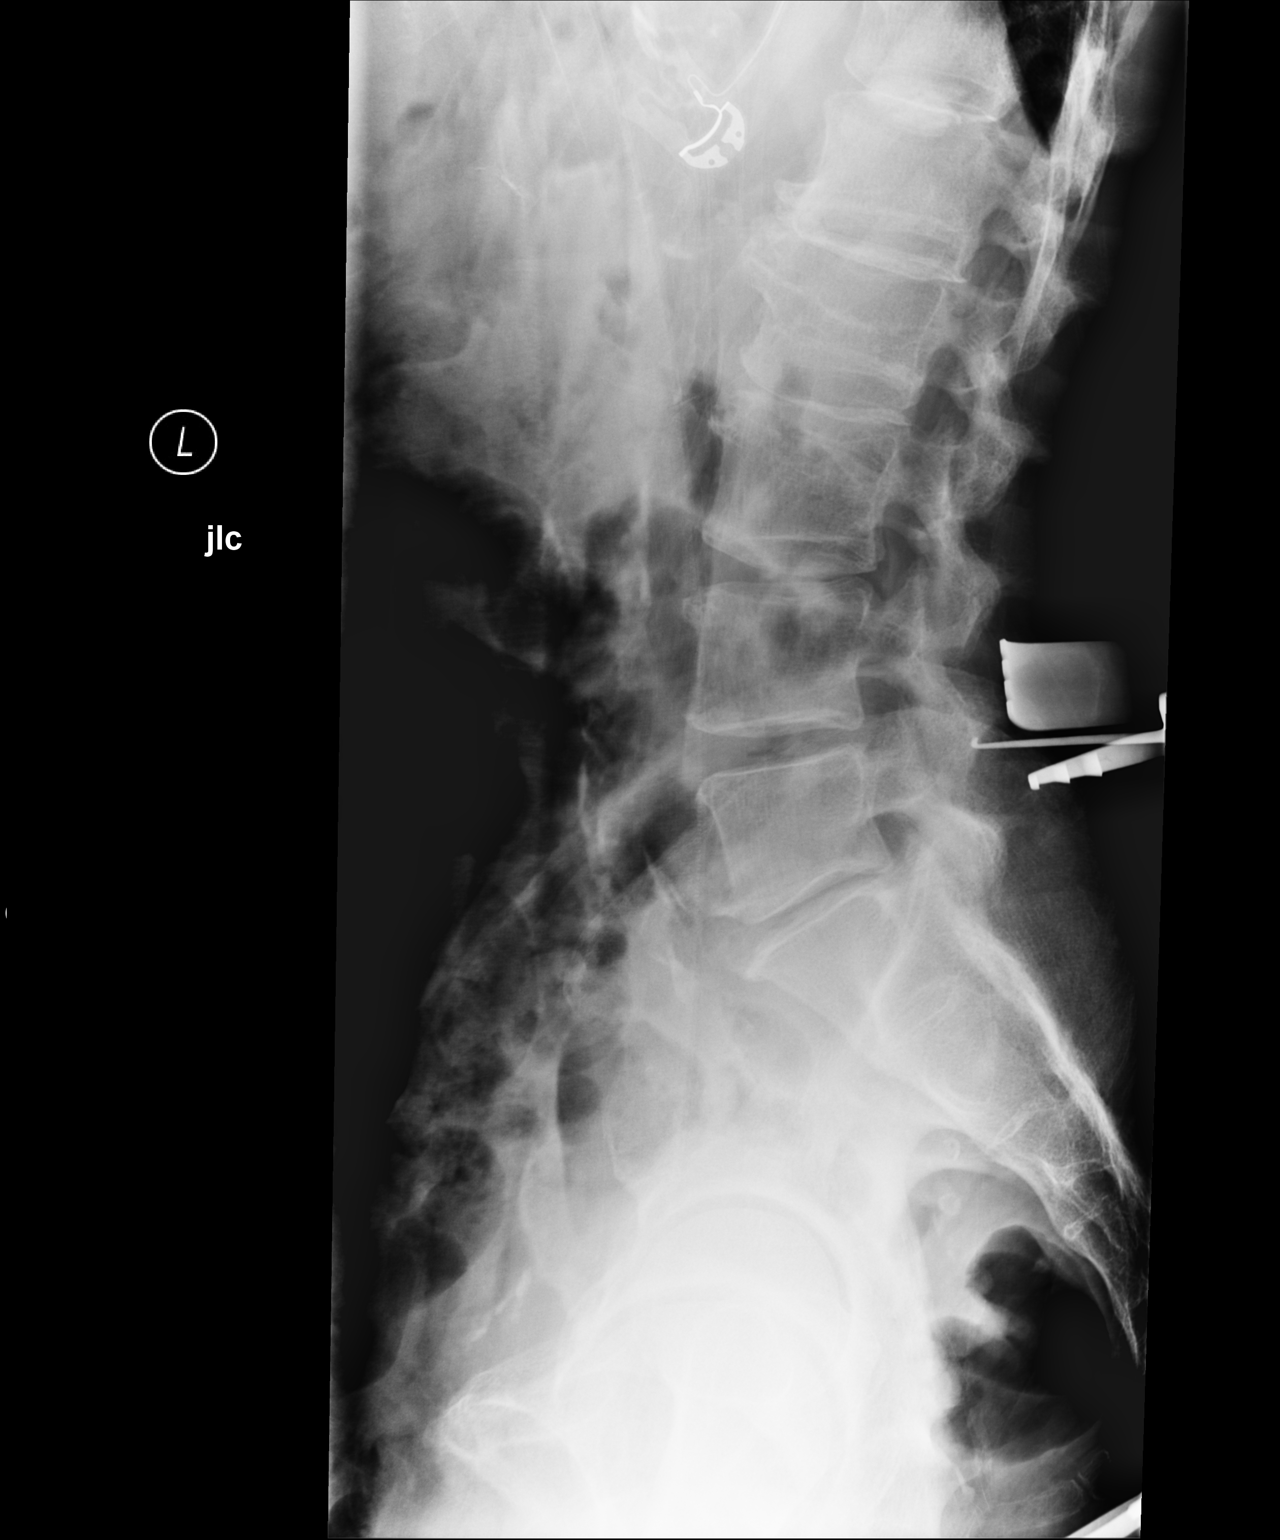

[1 of 1 positions shown; findings below may reference images not displayed]

FINDINGS: The tip of the localizer is at the level of L4-L5 facet and L5
superior endplate.
IMPRESSION: Localizer at the level of L5 superior endplate.

## 2022-03-12 ENCOUNTER — Other Ambulatory Visit: Payer: Self-pay | Admitting: Family Medicine

## 2022-03-12 DIAGNOSIS — N521 Erectile dysfunction due to diseases classified elsewhere: Secondary | ICD-10-CM

## 2022-03-19 ENCOUNTER — Other Ambulatory Visit: Payer: Self-pay | Admitting: Family Medicine

## 2022-03-19 DIAGNOSIS — G479 Sleep disorder, unspecified: Secondary | ICD-10-CM

## 2022-03-19 NOTE — Telephone Encounter (Signed)
Is this okay to refill? 

## 2022-03-25 ENCOUNTER — Ambulatory Visit (INDEPENDENT_AMBULATORY_CARE_PROVIDER_SITE_OTHER): Payer: Medicare PPO

## 2022-03-25 VITALS — Ht 67.0 in | Wt 125.0 lb

## 2022-03-25 DIAGNOSIS — Z0000 Encounter for general adult medical examination without abnormal findings: Secondary | ICD-10-CM | POA: Diagnosis not present

## 2022-03-25 NOTE — Patient Instructions (Signed)
[name] , Thank you for taking time to come for your Medicare Wellness Visit. I appreciate your ongoing commitment to your health goals. Please review the following plan we discussed and let me know if I can assist you in the future.   These are the goals we discussed:  Goals      Patient Stated     03/22/2021, keep walking     Patient Stated     03/25/2022, continue walking        This is a list of the screening recommended for you and due dates:  Health Maintenance  Topic Date Due   Flu Shot  10/08/2021   COVID-19 Vaccine (6 - 2023-24 season) 02/03/2022   DTaP/Tdap/Td vaccine (2 - Td or Tdap) 03/15/2022   Medicare Annual Wellness Visit  03/26/2023   Pneumonia Vaccine  Completed   Hepatitis C Screening: USPSTF Recommendation to screen - Ages 18-79 yo.  Completed   Zoster (Shingles) Vaccine  Completed   HPV Vaccine  Aged Out   Cologuard (Stool DNA test)  Discontinued    Advanced directives: Advance directive discussed with you today. Even though you declined this today please call our office should you change your mind and we can give you the proper paperwork for you to fill out.  Conditions/risks identified: smoking  Next appointment: Follow up in one year for your annual wellness visit.   Preventive Care 77 Years and Older, Male  Preventive care refers to lifestyle choices and visits with your health care provider that can promote health and wellness. What does preventive care include? A yearly physical exam. This is also called an annual well check. Dental exams once or twice a year. Routine eye exams. Ask your health care provider how often you should have your eyes checked. Personal lifestyle choices, including: Daily care of your teeth and gums. Regular physical activity. Eating a healthy diet. Avoiding tobacco and drug use. Limiting alcohol use. Practicing safe sex. Taking low doses of aspirin every day. Taking vitamin and mineral supplements as recommended  by your health care provider. What happens during an annual well check? The services and screenings done by your health care provider during your annual well check will depend on your age, overall health, lifestyle risk factors, and family history of disease. Counseling  Your health care provider may ask you questions about your: Alcohol use. Tobacco use. Drug use. Emotional well-being. Home and relationship well-being. Sexual activity. Eating habits. History of falls. Memory and ability to understand (cognition). Work and work Statistician. Screening  You may have the following tests or measurements: Height, weight, and BMI. Blood pressure. Lipid and cholesterol levels. These may be checked every 5 years, or more frequently if you are over 49 years old. Skin check. Lung cancer screening. You may have this screening every year starting at age 75 if you have a 30-pack-year history of smoking and currently smoke or have quit within the past 15 years. Fecal occult blood test (FOBT) of the stool. You may have this test every year starting at age 6. Flexible sigmoidoscopy or colonoscopy. You may have a sigmoidoscopy every 5 years or a colonoscopy every 10 years starting at age 23. Prostate cancer screening. Recommendations will vary depending on your family history and other risks. Hepatitis C blood test. Hepatitis B blood test. Sexually transmitted disease (STD) testing. Diabetes screening. This is done by checking your blood sugar (glucose) after you have not eaten for a while (fasting). You may have this done  every 1-3 years. Abdominal aortic aneurysm (AAA) screening. You may need this if you are a current or former smoker. Osteoporosis. You may be screened starting at age 56 if you are at high risk. Talk with your health care provider about your test results, treatment options, and if necessary, the need for more tests. Vaccines  Your health care provider may recommend certain  vaccines, such as: Influenza vaccine. This is recommended every year. Tetanus, diphtheria, and acellular pertussis (Tdap, Td) vaccine. You may need a Td booster every 10 years. Zoster vaccine. You may need this after age 40. Pneumococcal 13-valent conjugate (PCV13) vaccine. One dose is recommended after age 47. Pneumococcal polysaccharide (PPSV23) vaccine. One dose is recommended after age 25. Talk to your health care provider about which screenings and vaccines you need and how often you need them. This information is not intended to replace advice given to you by your health care provider. Make sure you discuss any questions you have with your health care provider. Document Released: 03/23/2015 Document Revised: 11/14/2015 Document Reviewed: 12/26/2014 Elsevier Interactive Patient Education  2017 [place] Prevention in the Home Falls can cause injuries. They can happen to people of all ages. There are many things you can do to make your home safe and to help prevent falls. What can I do on the outside of my home? Regularly fix the edges of walkways and driveways and fix any cracks. Remove anything that might make you trip as you walk through a door, such as a raised step or threshold. Trim any bushes or trees on the path to your home. Use bright outdoor lighting. Clear any walking paths of anything that might make someone trip, such as rocks or tools. Regularly check to see if handrails are loose or broken. Make sure that both sides of any steps have handrails. Any raised decks and porches should have guardrails on the edges. Have any leaves, snow, or ice cleared regularly. Use sand or salt on walking paths during winter. Clean up any spills in your garage right away. This includes oil or grease spills. What can I do in the bathroom? Use night lights. Install grab bars by the toilet and in the tub and shower. Do not use towel bars as grab bars. Use non-skid mats or decals in  the tub or shower. If you need to sit down in the shower, use a plastic, non-slip stool. Keep the floor dry. Clean up any water that spills on the floor as soon as it happens. Remove soap buildup in the tub or shower regularly. Attach bath mats securely with double-sided non-slip rug tape. Do not have throw rugs and other things on the floor that can make you trip. What can I do in the bedroom? Use night lights. Make sure that you have a light by your bed that is easy to reach. Do not use any sheets or blankets that are too big for your bed. They should not hang down onto the floor. Have a firm chair that has side arms. You can use this for support while you get dressed. Do not have throw rugs and other things on the floor that can make you trip. What can I do in the kitchen? Clean up any spills right away. Avoid walking on wet floors. Keep items that you use a lot in easy-to-reach places. If you need to reach something above you, use a strong step stool that has a grab bar. Keep electrical cords out of the  way. Do not use floor polish or wax that makes floors slippery. If you must use wax, use non-skid floor wax. Do not have throw rugs and other things on the floor that can make you trip. What can I do with my stairs? Do not leave any items on the stairs. Make sure that there are handrails on both sides of the stairs and use them. Fix handrails that are broken or loose. Make sure that handrails are as long as the stairways. Check any carpeting to make sure that it is firmly attached to the stairs. Fix any carpet that is loose or worn. Avoid having throw rugs at the top or bottom of the stairs. If you do have throw rugs, attach them to the floor with carpet tape. Make sure that you have a light switch at the top of the stairs and the bottom of the stairs. If you do not have them, ask someone to add them for you. What else can I do to help prevent falls? Wear shoes that: Do not have high  heels. Have rubber bottoms. Are comfortable and fit you well. Are closed at the toe. Do not wear sandals. If you use a stepladder: Make sure that it is fully opened. Do not climb a closed stepladder. Make sure that both sides of the stepladder are locked into place. Ask someone to hold it for you, if possible. Clearly mark and make sure that you can see: Any grab bars or handrails. First and last steps. Where the edge of each step is. Use tools that help you move around (mobility aids) if they are needed. These include: Canes. Walkers. Scooters. Crutches. Turn on the lights when you go into a dark area. Replace any light bulbs as soon as they burn out. Set up your furniture so you have a clear path. Avoid moving your furniture around. If any of your floors are uneven, fix them. If there are any pets around you, be aware of where they are. Review your medicines with your doctor. Some medicines can make you feel dizzy. This can increase your chance of falling. Ask your doctor what other things that you can do to help prevent falls. This information is not intended to replace advice given to you by your health care provider. Make sure you discuss any questions you have with your health care provider. Document Released: 12/21/2008 Document Revised: 08/02/2015 Document Reviewed: 03/31/2014 Elsevier Interactive Patient Education  2017 [employer].

## 2022-03-25 NOTE — Progress Notes (Signed)
I connected with [name] today by telephone and verified that I am speaking with the correct person using two identifiers. Location patient: home Location provider: work Persons participating in the virtual visit: [place], [place] LPN.   I discussed the limitations, risks, security and privacy concerns of performing an evaluation and management service by telephone and the availability of in person appointments. I also discussed with the patient that there may be a patient responsible charge related to this service. The patient expressed understanding and verbally consented to this telephonic visit.    Interactive audio and video telecommunications were attempted between this provider and patient, however failed, due to patient having technical difficulties OR patient did not have access to video capability.  We continued and completed visit with audio only.     Vital signs may be patient reported or missing.  Subjective:   [name] is a 77 y.o. male who presents for Medicare Annual/Subsequent preventive examination.  Review of Systems     Cardiac Risk Factors include: advanced age (>43mn, >>45women);dyslipidemia;hypertension;male gender;smoking/ tobacco exposure     Objective:    Today's Vitals   03/25/22 1356  Weight: 125 lb (56.7 kg)  Height: '5\' 7"'$  (1.702 m)   Body mass index is 19.58 kg/m.     03/25/2022    2:05 PM 03/22/2021    8:18 AM 09/07/2020    2:35 PM 03/13/2020    8:46 AM 02/16/2019    8:51 AM 09/16/2018    9:33 AM 05/20/2017   11:36 AM  Advanced Directives  Does Patient Have a Medical Advance Directive? No No No No Yes Yes Yes  Type of APersonnel officerLiving will Living will;Healthcare Power of APadroniLiving will  Does patient want to make changes to medical advance directive?     No - Patient declined No - Patient declined   [facility] [place]  [place]?      No - copy requested   Would patient like information on creating a medical advance directive?    Yes (ED - Information included in AVS)       Current Medications (verified) Outpatient Encounter Medications as of 03/25/2022  Medication Sig   acetaminophen (TYLENOL) 500 MG tablet Take 1,000 mg by mouth every 8 (eight) hours as needed for moderate pain.   BELSOMRA 10 MG TABS TAKE 1 TABLET BY MOUTH EVERY DAY AT BEDTIME AS NEEDED   celecoxib (CELEBREX) 200 MG capsule TAKE 1 CAPSULE TWICE DAILY   dolutegravir-lamiVUDine (DOVATO) 50-300 MG tablet Take 1 tablet by mouth daily.   ezetimibe (ZETIA) 10 MG tablet Take 1 tablet (10 mg total) by mouth daily.   losartan-hydrochlorothiazide (HYZAAR) 50-12.5 MG tablet TAKE 1 TABLET BY MOUTH EVERY DAY   Multiple Vitamins-Minerals (MULTIVITAMIN WITH MINERALS) tablet Take 1 tablet by mouth daily.   nitroGLYCERIN (NITROSTAT) 0.3 MG SL tablet Place 1 tablet (0.3 mg total) under the tongue every 5 (five) minutes as needed for chest pain.   rosuvastatin (CRESTOR) 20 MG tablet TAKE 1 TABLET BY MOUTH EVERY DAY   tadalafil (CIALIS) 5 MG tablet TAKE 1 TABLET BY MOUTH DAILY AS NEEDED FOR ERECTILE DYSFUNCTION.   valACYclovir (VALTREX) 1000 MG tablet 2 tablets twice a day for 1 day   dronabinol (MARINOL) 2.5 MG capsule Take 1 capsule (2.5 mg total) by mouth 2 (two) times daily before a meal. (Patient not taking: Reported on 03/22/2021)  oxyCODONE-acetaminophen (PERCOCET/ROXICET) 5-325 MG tablet Take 1 tablet by mouth every 4 (four) hours as needed for severe pain. (Patient not taking: Reported on 03/25/2022)   No facility-administered encounter medications on file as of 03/25/2022.    Allergies (verified) Patient has no known allergies.   History: Past Medical History:  Diagnosis Date   Allergy    RHINITIS   Arthritis    ASHD (arteriosclerotic heart disease)    Former smoker QUIT 08/09/2010   Herpes labialis    HH (hiatus hernia)    History of kidney  stones    HIV infection ([place])    POSITIVE   Hypertriglyceridemia    Psoriasis    ARTHRITIS   Renal stones    Tinnitus    CHRONIC   Past Surgical History:  Procedure Laterality Date   CORONARY ARTERY BYPASS GRAFT  08/20/1998   CABG x4: LIMA-LAD, SVG-Intermediate, SVG-distal CX, SVG-PDA   LUMBAR LAMINECTOMY/DECOMPRESSION MICRODISCECTOMY Left 10/01/2020   Procedure: Laminectomy - left - Lumbar three-Lumbar four - Lumbar four-Lumbar five microdiskectomy;  Surgeon: Eustace Moore, MD;  Location: [place];  Service: Neurosurgery;  Laterality: Left;   Family History  Problem Relation Age of Onset   Coronary artery disease Mother    Coronary artery disease Father    CAD Brother    Social History   Socioeconomic History   Marital status: Single    Spouse name: Not on file   Number of children: Not on file   Years of education: Not on file   Highest education level: Not on file  Occupational History   Not on file  Tobacco Use   Smoking status: Some Days    Packs/day: 0.25    Types: Cigarettes   Smokeless tobacco: Never  Vaping Use   Vaping Use: Never used  Substance and Sexual Activity   Alcohol use: Yes    Alcohol/week: 4.0 standard drinks of alcohol    Types: 4 Glasses of wine per week    Comment: occasional   Drug use: No   Sexual activity: Yes  Other Topics Concern   Not on file  Social History Narrative   Not on file   Social Determinants of Health   Financial Resource Strain: Low Risk  (03/25/2022)   Overall Financial Resource Strain (CARDIA)    Difficulty of Paying Living Expenses: Not hard at all  Food Insecurity: No Food Insecurity (03/25/2022)   Hunger Vital Sign    Worried About Running Out of Food in the Last Year: Never true    Ran Out of Food in the Last Year: Never true  Transportation Needs: No Transportation Needs (03/25/2022)   PRAPARE - Hydrologist (Medical): No    Lack of Transportation (Non-Medical): No  Physical  Activity: Sufficiently Active (03/25/2022)   Exercise Vital Sign    Days of Exercise per Week: 7 days    Minutes of Exercise per Session: 60 min  Stress: No Stress Concern Present (03/25/2022)   [place]    Feeling of Stress : Only a little  Social Connections: Not on file    Tobacco Counseling Ready to quit: Yes Counseling given: Not Answered   Clinical Intake:  Pre-visit preparation completed: Yes  Pain : No/denies pain     Nutritional Status: BMI of 19-24  Normal Nutritional Risks: None Diabetes: No  How often do you need to have someone help you when you read instructions, pamphlets, or other  written materials from your doctor or pharmacy?: 1 - Never  Diabetic? no  Interpreter Needed?: No  Information entered by :: NAllen LPN   Activities of Daily Living    03/25/2022    2:06 PM  In your present state of health, do you have any difficulty performing the following activities:  Hearing? 0  Vision? 0  Difficulty concentrating or making decisions? 0  Walking or climbing stairs? 0  Dressing or bathing? 0  Doing errands, shopping? 0  Preparing Food and eating ? N  Using the Toilet? N  In the past six months, have you accidently leaked urine? N  Do you have problems with loss of bowel control? N  Managing your Medications? N  Managing your Finances? N  Housekeeping or managing your Housekeeping? N    Patient Care Team: Denita Lung, MD as PCP - General (Family Medicine) Nahser, Wonda Cheng, MD as PCP - Cardiology (Cardiology)  Indicate any recent Medical Services you may have received from other than [facility] providers in the past year (date may be approximate).     Assessment:   This is a routine wellness examination for [name].  Hearing/Vision screen Vision Screening - Comments:: Regular eye exams, [place]  Dietary issues and exercise activities discussed: Current Exercise  Habits: Home exercise routine, Type of exercise: walking, Time (Minutes): 60, Frequency (Times/Week): 7, Weekly Exercise (Minutes/Week): 420   Goals Addressed             This Visit's Progress    Patient Stated       03/25/2022, continue walking       Depression Screen    03/25/2022    2:06 PM 03/22/2021    8:19 AM 03/13/2020    8:49 AM 12/20/2019    3:44 PM 09/16/2018    8:43 AM 09/29/2017    8:09 AM 09/25/2016    8:08 AM  PHQ 2/9 Scores  PHQ - 2 Score 0 0 0 0 0 0 0  PHQ- 9 Score 0          Fall Risk    03/25/2022    2:05 PM 03/22/2021    8:19 AM 12/20/2019    3:45 PM 09/16/2018    8:43 AM 09/29/2017    8:09 AM  Fall Risk   Falls in the past year? 0 0 0 0 No  Number falls in past yr: 0      Injury with Fall? 0      Risk for fall due to : Medication side effect Impaired balance/gait;Medication side effect     Follow up Falls prevention discussed;Education provided;Falls evaluation completed Falls evaluation completed;Education provided;Falls prevention discussed       FALL RISK PREVENTION PERTAINING TO THE HOME:  Any stairs in or around the home? Yes  If so, are there any without handrails? No  Home free of loose throw rugs in walkways, pet beds, electrical cords, etc? Yes  Adequate lighting in your home to reduce risk of falls? Yes   ASSISTIVE DEVICES UTILIZED TO PREVENT FALLS:  Life alert? No  Use of a cane, walker or w/c? No  Grab bars in the bathroom? Yes  Shower chair or bench in shower? No  Elevated toilet seat or a handicapped toilet? Yes   TIMED UP AND GO:  Was the test performed? No .      Cognitive Function:        03/25/2022    2:09 PM 03/22/2021    8:21  AM  6CIT Screen  What Year? 0 points 0 points  What month? 0 points 0 points  What time? 0 points 0 points  Count back from 20 0 points 0 points  Months in reverse 2 points 0 points  Repeat phrase 0 points 0 points  Total Score 2 points 0 points    Immunizations Immunization History   Administered Date(s) Administered   Fluad Quad(high Dose 65+) 11/29/2018, 12/20/2019, 12/28/2020   Influenza Split 01/15/2011, 03/15/2012   Influenza Whole 12/28/2006, 01/11/2009   Influenza, High Dose Seasonal PF 02/10/2013, 03/29/2014, 03/21/2015, 04/03/2016, 03/24/2017   PFIZER(Purple Top)SARS-COV-2 Vaccination 04/17/2019, 05/11/2019, 12/20/2019   Pfizer Covid-19 Vaccine Bivalent Booster 58yr & up 03/07/2021   Pneumococcal Conjugate-13 02/12/2005   Pneumococcal Polysaccharide-23 03/24/2013   Tdap 03/15/2012   Unspecified SARS-COV-2 Vaccination 12/09/2021   Zoster Recombinat (Shingrix) 01/09/2019, 07/11/2019    TDAP status: Due, Education has been provided regarding the importance of this vaccine. Advised may receive this vaccine at local pharmacy or Health Dept. Aware to provide a copy of the vaccination record if obtained from local pharmacy or Health Dept. Verbalized acceptance and understanding.  Flu Vaccine status: Due, Education has been provided regarding the importance of this vaccine. Advised may receive this vaccine at local pharmacy or Health Dept. Aware to provide a copy of the vaccination record if obtained from local pharmacy or Health Dept. Verbalized acceptance and understanding.  Pneumococcal vaccine status: Up to date  Covid-19 vaccine status: Completed vaccines  Qualifies for Shingles Vaccine? Yes   Zostavax completed Yes   Shingrix Completed?: Yes  Screening Tests Health Maintenance  Topic Date Due   INFLUENZA VACCINE  10/08/2021   COVID-19 Vaccine (6 - 2023-24 season) 02/03/2022   DTaP/Tdap/Td (2 - Td or Tdap) 03/15/2022   Medicare Annual Wellness (AWV)  03/26/2023   Pneumonia Vaccine 77 Years old  Completed   Hepatitis C Screening  Completed   Zoster Vaccines- Shingrix  Completed   HPV VACCINES  Aged Out   Fecal DNA (Cologuard)  Discontinued    Health Maintenance  Health Maintenance Due  Topic Date Due   INFLUENZA VACCINE  10/08/2021   COVID-19  Vaccine (6 - 2023-24 season) 02/03/2022   DTaP/Tdap/Td (2 - Td or Tdap) 03/15/2022    Colorectal cancer screening: No longer required.   Lung Cancer Screening: (Low Dose CT Chest recommended if Age 77-80years, 30 pack-year currently smoking OR have quit w/in 15years.) does not qualify.   Lung Cancer Screening Referral: no  Additional Screening:  Hepatitis C Screening: does qualify; Completed 09/25/2015  Vision Screening: Recommended annual ophthalmology exams for early detection of glaucoma and other disorders of the eye. Is the patient up to date with their annual eye exam?  Yes  Who is the provider or what is the name of the office in which the patient attends annual eye exams? [email or web address] [facility] is not established with a provider, would they like to be referred to a provider to establish care? No .   Dental Screening: Recommended annual dental exams for proper oral hygiene  Community Resource Referral / Chronic Care Management: CRR required this visit?  No   CCM required this visit?  No      Plan:     I have personally reviewed and noted the following in the patient's chart:   Medical and social history Use of alcohol, tobacco or illicit drugs  Current medications and supplements including opioid prescriptions. Patient is not currently taking opioid  prescriptions. Functional ability and status Nutritional status Physical activity Advanced directives List of other physicians Hospitalizations, surgeries, and ER visits in previous 12 months Vitals Screenings to include cognitive, depression, and falls Referrals and appointments  In addition, I have reviewed and discussed with patient certain preventive protocols, quality metrics, and best practice recommendations. A written personalized care plan for preventive services as well as general preventive health recommendations were provided to patient.     Kellie Simmering, LPN   5/33/1740   Nurse Notes:  none  Due to this being a virtual visit, the after visit summary with patients personalized plan was offered to patient via mail or my-chart. Patient would like to access on my-chart

## 2022-03-28 ENCOUNTER — Ambulatory Visit: Payer: Medicare PPO

## 2022-03-31 DIAGNOSIS — G479 Sleep disorder, unspecified: Secondary | ICD-10-CM | POA: Insufficient documentation

## 2022-04-01 ENCOUNTER — Encounter: Payer: Self-pay | Admitting: Family Medicine

## 2022-04-01 ENCOUNTER — Ambulatory Visit (INDEPENDENT_AMBULATORY_CARE_PROVIDER_SITE_OTHER): Payer: Medicare PPO | Admitting: Family Medicine

## 2022-04-01 VITALS — BP 136/70 | HR 77 | Resp 16 | Ht 65.5 in | Wt 127.0 lb

## 2022-04-01 DIAGNOSIS — Z0000 Encounter for general adult medical examination without abnormal findings: Secondary | ICD-10-CM

## 2022-04-01 DIAGNOSIS — I2510 Atherosclerotic heart disease of native coronary artery without angina pectoris: Secondary | ICD-10-CM | POA: Diagnosis not present

## 2022-04-01 DIAGNOSIS — J301 Allergic rhinitis due to pollen: Secondary | ICD-10-CM

## 2022-04-01 DIAGNOSIS — Z9889 Other specified postprocedural states: Secondary | ICD-10-CM

## 2022-04-01 DIAGNOSIS — N522 Drug-induced erectile dysfunction: Secondary | ICD-10-CM

## 2022-04-01 DIAGNOSIS — E782 Mixed hyperlipidemia: Secondary | ICD-10-CM

## 2022-04-01 DIAGNOSIS — I10 Essential (primary) hypertension: Secondary | ICD-10-CM

## 2022-04-01 DIAGNOSIS — Z209 Contact with and (suspected) exposure to unspecified communicable disease: Secondary | ICD-10-CM

## 2022-04-01 DIAGNOSIS — L4050 Arthropathic psoriasis, unspecified: Secondary | ICD-10-CM | POA: Diagnosis not present

## 2022-04-01 DIAGNOSIS — G479 Sleep disorder, unspecified: Secondary | ICD-10-CM

## 2022-04-01 DIAGNOSIS — Z79899 Other long term (current) drug therapy: Secondary | ICD-10-CM

## 2022-04-01 DIAGNOSIS — Z21 Asymptomatic human immunodeficiency virus [HIV] infection status: Secondary | ICD-10-CM

## 2022-04-01 DIAGNOSIS — K4020 Bilateral inguinal hernia, without obstruction or gangrene, not specified as recurrent: Secondary | ICD-10-CM

## 2022-04-01 DIAGNOSIS — N521 Erectile dysfunction due to diseases classified elsewhere: Secondary | ICD-10-CM

## 2022-04-01 DIAGNOSIS — K219 Gastro-esophageal reflux disease without esophagitis: Secondary | ICD-10-CM | POA: Diagnosis not present

## 2022-04-01 DIAGNOSIS — Z23 Encounter for immunization: Secondary | ICD-10-CM

## 2022-04-01 DIAGNOSIS — N1831 Chronic kidney disease, stage 3a: Secondary | ICD-10-CM | POA: Diagnosis not present

## 2022-04-01 DIAGNOSIS — I700 Atherosclerosis of aorta: Secondary | ICD-10-CM | POA: Diagnosis not present

## 2022-04-01 DIAGNOSIS — E785 Hyperlipidemia, unspecified: Secondary | ICD-10-CM

## 2022-04-01 MED ORDER — BELSOMRA 10 MG PO TABS: 1.0000 | ORAL_TABLET | Freq: Every evening | ORAL | 1 refills | Status: DC | PRN

## 2022-04-01 MED ORDER — EZETIMIBE 10 MG PO TABS: 10.0000 mg | ORAL_TABLET | Freq: Every day | ORAL | 3 refills | Status: DC

## 2022-04-01 MED ORDER — TADALAFIL 5 MG PO TABS: ORAL_TABLET | ORAL | 2 refills | Status: DC

## 2022-04-01 MED ORDER — CELECOXIB 200 MG PO CAPS: 200.0000 mg | ORAL_CAPSULE | Freq: Two times a day (BID) | ORAL | 1 refills | Status: DC

## 2022-04-01 MED ORDER — DOVATO 50-300 MG PO TABS: 1.0000 | ORAL_TABLET | Freq: Every day | ORAL | 3 refills | Status: DC

## 2022-04-01 MED ORDER — LOSARTAN POTASSIUM-HCTZ 50-12.5 MG PO TABS: 1.0000 | ORAL_TABLET | Freq: Every day | ORAL | 3 refills | Status: DC

## 2022-04-01 MED ORDER — ROSUVASTATIN CALCIUM 20 MG PO TABS: 20.0000 mg | ORAL_TABLET | Freq: Every day | ORAL | 3 refills | Status: DC

## 2022-04-01 NOTE — Progress Notes (Signed)
Complete physical exam  Patient: [name]   DOB: [birth year]   77 y.o. Male  MRN: [id number]  Subjective:    Chief Complaint  Patient presents with   Annual Exam    [name] is a 77 y.o. male who presents today for a complete physical exam. He reports consuming a general diet. Home exercise routine includes walking 1-2 miles daily. He generally feels well. He reports sleeping fairly well. He does have bilateral inguinal hernias that he is known about for quite some time.  He is now ready to have something done about them.  Otherwise he has no particular concerns or complaints.  No chest pain, shortness of breath PND.  He continues on Crestor and Zetia.  He is also taking losartan/HCTZ.  Uses Cialis on an as-needed basis.  Also Belsomra is helping for intermittent sleep issues.  He continues on Dovato for his underlying HIV and has had no difficulty with that.  He is sexually active and would like to be STD tested.  He uses Celebrex for his psoriatic arthritis.  Allergies seem to be under good control.  He still does have occasional difficulty with low back pain.  Blood work does show evidence of CKD.  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  He is in a 25-year relationship however they both apparently have extracurricular activities.   Most recent fall risk assessment:    03/25/2022    2:05 PM  [place] in the past year? 0  Number falls in past yr: 0  Injury with Fall? 0  Risk for fall due to : Medication side effect  Follow up Falls prevention discussed;Education provided;Falls evaluation completed     Most recent depression screenings:    03/25/2022    2:06 PM 03/22/2021    8:19 AM  PHQ 2/9 Scores  PHQ - 2 Score 0 0  PHQ- 9 Score 0     Vision:Within last year and Dental: Receives regular dental care    Patient Care Team: Denita Lung, MD as PCP - General (Family Medicine) Nahser, Wonda Cheng, MD as PCP -  Cardiology (Cardiology)   Outpatient Medications Prior to Visit  Medication Sig   acetaminophen (TYLENOL) 500 MG tablet Take 1,000 mg by mouth every 8 (eight) hours as needed for moderate pain.   dronabinol (MARINOL) 2.5 MG capsule Take 1 capsule (2.5 mg total) by mouth 2 (two) times daily before a meal.   Multiple Vitamins-Minerals (MULTIVITAMIN WITH MINERALS) tablet Take 1 tablet by mouth daily.   nitroGLYCERIN (NITROSTAT) 0.3 MG SL tablet Place 1 tablet (0.3 mg total) under the tongue every 5 (five) minutes as needed for chest pain.   valACYclovir (VALTREX) 1000 MG tablet 2 tablets twice a day for 1 day   oxyCODONE-acetaminophen (PERCOCET/ROXICET) 5-325 MG tablet Take 1 tablet by mouth every 4 (four) hours as needed for severe pain. (Patient not taking: Reported on 03/25/2022)   [DISCONTINUED] BELSOMRA 10 MG TABS TAKE 1 TABLET BY MOUTH EVERY DAY AT BEDTIME AS NEEDED   [DISCONTINUED] celecoxib (CELEBREX) 200 MG capsule TAKE 1 CAPSULE TWICE DAILY   [DISCONTINUED] dolutegravir-lamiVUDine (DOVATO) 50-300 MG tablet Take 1 tablet by mouth daily.   [DISCONTINUED] ezetimibe (ZETIA) 10 MG tablet Take 1 tablet (10 mg total) by mouth daily.   [DISCONTINUED] losartan-hydrochlorothiazide (HYZAAR) 50-12.5 MG tablet TAKE 1 TABLET BY MOUTH EVERY DAY   [DISCONTINUED] rosuvastatin (CRESTOR) 20 MG tablet TAKE 1  TABLET BY MOUTH EVERY DAY   [DISCONTINUED] tadalafil (CIALIS) 5 MG tablet TAKE 1 TABLET BY MOUTH DAILY AS NEEDED FOR ERECTILE DYSFUNCTION.   No facility-administered medications prior to visit.    Review of Systems  All other systems reviewed and are negative.         Objective:     BP 136/70   Pulse 77   Resp 16   Ht 5' 5.5" (1.664 m)   Wt 127 lb (57.6 kg)   SpO2 98% Comment: room air  BMI 20.81 kg/m    Physical Exam  Alert and in no distress. Tympanic membranes and canals are normal. Pharyngeal area is normal. Neck is supple without adenopathy or thyromegaly. Cardiac exam shows a  regular sinus rhythm without murmurs or gallops. Lungs are clear to auscultation.  Abdominal exam shows relatively large bilateral inguinal hernias      Assessment & Plan:    Routine general medical examination at a health care facility - Plan: CBC with Differential/Platelet, Comprehensive metabolic panel, Lipid panel  ASHD (arteriosclerotic heart disease) - Plan: CBC with Differential/Platelet, Comprehensive metabolic panel, Lipid panel  Atherosclerosis of aorta (HCC) - Plan: Lipid panel  Seasonal allergic rhinitis due to pollen  Gastroesophageal reflux disease without esophagitis  Psoriatic arthritis ([place]) - Plan: celecoxib (CELEBREX) 200 MG capsule  Stage 3a chronic kidney disease ([place]) - Plan: Comprehensive metabolic panel  Encounter for long-term (current) use of high-risk medication - Plan: CBC with Differential/Platelet, Comprehensive metabolic panel, HIV-1 RNA quant-no reflex-bld, Lipid panel, T-helper cells (CD4) count (not at [facility])  History of lumbar laminectomy  HIV positive ([place]) - Plan: CBC with Differential/Platelet, Comprehensive metabolic panel, HIV-1 RNA quant-no reflex-bld, Lipid panel, T-helper cells (CD4) count (not at [facility]), dolutegravir-lamiVUDine (DOVATO) 50-300 MG tablet  Sleep disturbance - Plan: Suvorexant (BELSOMRA) 10 MG TABS  Erectile dysfunction due to diseases classified elsewhere - Plan: tadalafil (CIALIS) 5 MG tablet  Hyperlipidemia, unspecified hyperlipidemia type - Plan: rosuvastatin (CRESTOR) 20 MG tablet, ezetimibe (ZETIA) 10 MG tablet  Essential hypertension - Plan: CBC with Differential/Platelet, Comprehensive metabolic panel, losartan-hydrochlorothiazide (HYZAAR) 50-12.5 MG tablet  Need for influenza vaccination - Plan: Flu Vaccine QUAD High Dose(Fluad)  Contact with or exposure to communicable disease - Plan: RPR, GC/Chlamydia Probe Amp  Bilateral inguinal hernia without obstruction or gangrene, recurrence not specified - Plan: Ambulatory  referral to General Surgery  Immunization History  Administered Date(s) Administered   Fluad Quad(high Dose 65+) 11/29/2018, 12/20/2019, 12/28/2020, 04/01/2022   Influenza Split 01/15/2011, 03/15/2012   Influenza Whole 12/28/2006, 01/11/2009   Influenza, High Dose Seasonal PF 02/10/2013, 03/29/2014, 03/21/2015, 04/03/2016, 03/24/2017   PFIZER(Purple Top)SARS-COV-2 Vaccination 04/17/2019, 05/11/2019, 12/20/2019   Pfizer Covid-19 Vaccine Bivalent Booster 44yr & up 03/07/2021   Pneumococcal Conjugate-13 02/12/2005   Pneumococcal Polysaccharide-23 03/24/2013   Tdap 03/15/2012   Unspecified SARS-COV-2 Vaccination 12/09/2021   Zoster Recombinat (Shingrix) 01/09/2019, 07/11/2019    Health Maintenance  Topic Date Due   DTaP/Tdap/Td (2 - Td or Tdap) 03/15/2022   COVID-19 Vaccine (6 - 2023-24 season) 06/18/2022 (Originally 02/03/2022)   Medicare Annual Wellness (AWV)  03/26/2023   Pneumonia Vaccine 77 Years old  Completed   INFLUENZA VACCINE  Completed   Hepatitis C Screening  Completed   Zoster Vaccines- Shingrix  Completed   HPV VACCINES  Aged Out   Fecal DNA (Cologuard)  Discontinued  Recommend Tdap and RSV at the drugstore  Discussed health benefits of physical activity, and encouraged him to engage in regular exercise appropriate for his age  and condition.  Problem List Items Addressed This Visit     Allergic rhinitis due to pollen (Chronic)   ASHD (arteriosclerotic heart disease) (Chronic)   Relevant Medications   tadalafil (CIALIS) 5 MG tablet   rosuvastatin (CRESTOR) 20 MG tablet   losartan-hydrochlorothiazide (HYZAAR) 50-12.5 MG tablet   ezetimibe (ZETIA) 10 MG tablet   Other Relevant Orders   CBC with Differential/Platelet   Comprehensive metabolic panel   Lipid panel   HIV positive (HCC) (Chronic)   Relevant Medications   dolutegravir-lamiVUDine (DOVATO) 50-300 MG tablet   Other Relevant Orders   CBC with Differential/Platelet   Comprehensive metabolic panel    HIV-1 RNA quant-no reflex-bld   Lipid panel   T-helper cells (CD4) count (not at [facility])   Hyperlipidemia (Chronic)   Relevant Medications   tadalafil (CIALIS) 5 MG tablet   rosuvastatin (CRESTOR) 20 MG tablet   losartan-hydrochlorothiazide (HYZAAR) 50-12.5 MG tablet   ezetimibe (ZETIA) 10 MG tablet   Psoriatic arthritis (HCC) (Chronic)   Relevant Medications   celecoxib (CELEBREX) 200 MG capsule   Atherosclerosis of aorta (HCC)   Relevant Medications   tadalafil (CIALIS) 5 MG tablet   rosuvastatin (CRESTOR) 20 MG tablet   losartan-hydrochlorothiazide (HYZAAR) 50-12.5 MG tablet   ezetimibe (ZETIA) 10 MG tablet   Other Relevant Orders   Lipid panel   Encounter for long-term (current) use of high-risk medication   Relevant Orders   CBC with Differential/Platelet   Comprehensive metabolic panel   HIV-1 RNA quant-no reflex-bld   Lipid panel   T-helper cells (CD4) count (not at [facility])   GERD (gastroesophageal reflux disease)   History of lumbar laminectomy   Sleep disturbance   Relevant Medications   Suvorexant (BELSOMRA) 10 MG TABS   Stage 3a chronic kidney disease (HCC)   Relevant Orders   Comprehensive metabolic panel   Other Visit Diagnoses     Routine general medical examination at a health care facility    -  Primary   Relevant Orders   CBC with Differential/Platelet   Comprehensive metabolic panel   Lipid panel   Erectile dysfunction due to diseases classified elsewhere       Relevant Medications   tadalafil (CIALIS) 5 MG tablet   Essential hypertension       Relevant Medications   tadalafil (CIALIS) 5 MG tablet   rosuvastatin (CRESTOR) 20 MG tablet   losartan-hydrochlorothiazide (HYZAAR) 50-12.5 MG tablet   ezetimibe (ZETIA) 10 MG tablet   Other Relevant Orders   CBC with Differential/Platelet   Comprehensive metabolic panel   Need for influenza vaccination       Relevant Orders   Flu Vaccine QUAD High Dose(Fluad) (Completed)   Contact with or exposure to  communicable disease       Relevant Orders   RPR   GC/Chlamydia Probe Amp   Bilateral inguinal hernia without obstruction or gangrene, recurrence not specified          Recheck 6 months    Jill Alexanders, MD

## 2022-04-02 LAB — T-HELPER CELLS (CD4) COUNT (NOT AT ARMC)
% CD 4 Pos. Lymph.: 40.4 % (ref 30.8–58.5)
Absolute CD 4 Helper: 364 /uL (ref 359–1519)
Basophils Absolute: 0 10*3/uL (ref 0.0–0.2)
Basos: 0 %
EOS (ABSOLUTE): 0.1 10*3/uL (ref 0.0–0.4)
Eos: 1 %
Hematocrit: 40.9 % (ref 37.5–51.0)
Hemoglobin: 14.6 g/dL (ref 13.0–17.7)
Immature Grans (Abs): 0 10*3/uL (ref 0.0–0.1)
Immature Granulocytes: 0 %
Lymphocytes Absolute: 0.9 10*3/uL (ref 0.7–3.1)
Lymphs: 20 %
MCH: 35.4 pg — ABNORMAL HIGH (ref 26.6–33.0)
MCHC: 35.7 g/dL (ref 31.5–35.7)
MCV: 99 fL — ABNORMAL HIGH (ref 79–97)
Monocytes Absolute: 0.3 10*3/uL (ref 0.1–0.9)
Monocytes: 6 %
Neutrophils Absolute: 3.4 10*3/uL (ref 1.4–7.0)
Neutrophils: 73 %
Platelets: 115 10*3/uL — ABNORMAL LOW (ref 150–450)
RBC: 4.12 x10E6/uL — ABNORMAL LOW (ref 4.14–5.80)
RDW: 12.3 % (ref 11.6–15.4)
WBC: 4.7 10*3/uL (ref 3.4–10.8)

## 2022-04-02 LAB — LIPID PANEL
Chol/HDL Ratio: 2.5 ratio (ref 0.0–5.0)
Cholesterol, Total: 98 mg/dL — ABNORMAL LOW (ref 100–199)
HDL: 39 mg/dL — ABNORMAL LOW (ref >39)
LDL Chol Calc (NIH): 39 mg/dL (ref 0–99)
Triglycerides: 104 mg/dL (ref 0–149)
VLDL Cholesterol Cal: 20 mg/dL (ref 5–40)

## 2022-04-02 LAB — COMPREHENSIVE METABOLIC PANEL
ALT: 15 IU/L (ref 0–44)
AST: 20 IU/L (ref 0–40)
Albumin/Globulin Ratio: 2.6 — ABNORMAL HIGH (ref 1.2–2.2)
Albumin: 4.9 g/dL — ABNORMAL HIGH (ref 3.8–4.8)
Alkaline Phosphatase: 66 IU/L (ref 44–121)
BUN/Creatinine Ratio: 15 (ref 10–24)
BUN: 28 mg/dL — ABNORMAL HIGH (ref 8–27)
Bilirubin Total: 0.6 mg/dL (ref 0.0–1.2)
CO2: 20 mmol/L (ref 20–29)
Calcium: 9.6 mg/dL (ref 8.6–10.2)
Chloride: 100 mmol/L (ref 96–106)
Creatinine, Ser: 1.86 mg/dL — ABNORMAL HIGH (ref 0.76–1.27)
Globulin, Total: 1.9 g/dL (ref 1.5–4.5)
Glucose: 93 mg/dL (ref 70–99)
Potassium: 4.7 mmol/L (ref 3.5–5.2)
Sodium: 136 mmol/L (ref 134–144)
Total Protein: 6.8 g/dL (ref 6.0–8.5)
eGFR: 37 mL/min/{1.73_m2} — ABNORMAL LOW (ref >59)

## 2022-04-02 LAB — HIV-1 RNA QUANT-NO REFLEX-BLD: HIV-1 RNA Viral Load: <20 copies/mL

## 2022-04-02 LAB — RPR: RPR Ser Ql: NONREACTIVE

## 2022-04-03 LAB — GC/CHLAMYDIA PROBE AMP
Chlamydia trachomatis, NAA: NEGATIVE
Neisseria Gonorrhoeae by PCR: NEGATIVE

## 2022-04-18 ENCOUNTER — Ambulatory Visit: Payer: Self-pay | Admitting: Surgery

## 2022-04-18 ENCOUNTER — Telehealth: Payer: Self-pay | Admitting: *Deleted

## 2022-04-18 DIAGNOSIS — K4020 Bilateral inguinal hernia, without obstruction or gangrene, not specified as recurrent: Secondary | ICD-10-CM | POA: Diagnosis not present

## 2022-04-18 NOTE — Telephone Encounter (Signed)
   Name: [name]  DOB: Mar 23, 1945  MRN: [id number]  Primary Cardiologist: Mertie Moores, MD   Preoperative team, please contact this patient and set up a phone call appointment for further preoperative risk assessment. Please obtain consent and complete medication review. Thank you for your help.  I confirm that guidance regarding antiplatelet and oral anticoagulation therapy has been completed and, if necessary, noted below.  No medications requested.   Mable Fill, Marissa Nestle, NP 04/18/2022, 3:44 PM [place]

## 2022-04-18 NOTE — H&P (Signed)
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 [name]), former tobacco abuse (quit 2012), hiatal hernia, kidney stones, HIV, hypertriglyceridemia, psoriasis, chronic kidney disease (last creatinine 1.86 last month) presents for evaluation of bilateral inguinal hernias.  He has known about these for several years.  They do not cause him any pain or other symptoms but they have increased in size over time and he now feels ready to have them repaired.  He had a CT scan done in October 2021 which I have reviewed; this demonstrates a very tiny fat-containing umbilical hernia as well as bilateral inguinal hernias with small bowel entering the right side but only fat on the left.  Both sides are fairly sizable defects.  Additional findings noted concern for prostate cancer, diverticulosis, aortic atherosclerosis, hepatic steatosis, subcentimeter left adrenal nodule.  Denies tobacco use.  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  He walks 2 to 4 miles a day.   Review of Systems: A complete review of systems was obtained from the patient.  I have reviewed this information and discussed as appropriate with the patient.  See HPI as well for other ROS.   Medical History: Past Medical History:  Diagnosis Date   Arthritis    Hyperlipidemia     There is no problem list on file for this patient.   Past Surgical History:  Procedure Laterality Date   SPINE SURGERY       No Known Allergies  Current Outpatient Medications on File Prior to Visit  Medication Sig Dispense Refill   celecoxib (CELEBREX) 200 MG capsule Take 200 mg by mouth 2 (two) times daily     DOVATO 50-300 mg Tab Take 1 tablet by  mouth once daily     losartan-hydroCHLOROthiazide (HYZAAR) 50-12.5 mg tablet Take 1 tablet by mouth once daily     rosuvastatin (CRESTOR) 20 MG tablet Take 20 mg by mouth once daily     ezetimibe (ZETIA) 10 mg tablet Take 10 mg by mouth once daily     No current facility-administered medications on file prior to visit.    Family History  Problem Relation Age of Onset   Hyperlipidemia (Elevated cholesterol) Mother    Diabetes Mother      Social History   Tobacco Use  Smoking Status Every Day   Types: Cigarettes  Smokeless Tobacco Never     Social History   Socioeconomic History   Marital status: Single  Tobacco Use   Smoking status: Every Day    Types: Cigarettes   Smokeless tobacco: Never  Substance and Sexual Activity   Alcohol use: Not Currently   Drug use: Never    Objective:    Vitals:   04/18/22 1032  BP: (!) 154/78  Pulse: 86  Temp: 36.7 C (98 F)  SpO2: 98%  Weight: 58.1 kg (128 lb)  Height: 165.1 cm (5' 5"$ )    Body mass index is 21.3 kg/m.  Gen: A&Ox3, no distress  Unlabored respirations Abdomen soft and nontender.  There is a chronically incarcerated small umbilical hernia containing fat which is nontender.  He does have large bilateral inguinal hernias.  Assessment and Plan:  Diagnoses and all orders for this visit:  Non-recurrent bilateral inguinal hernia without obstruction or gangrene    These are quite large and based on the CT scan he had 2-1/2 years ago, there is essentially disruption of the inguinal floor.  I do think these would be best managed with an open repair to afford an attempt at reconstructing the inguinal floor.  I discussed this with him.  We went over the surgery and discussed the relevant anatomy and we discussed the technique of the procedure.  Discussed risks of bleeding, infection, pain, scarring, injury to structures in the area including nerves, blood vessels, bowel, bladder, risk of chronic pain, hernia recurrence,  risk of seroma or hematoma, urinary retention, and risks of general anesthesia including cardiovascular, pulmonary, and thromboembolic complications.  Discussed postoperative activity limitations and expected timeline.  Questions were answered.  Patient wishes to proceed with scheduling.   Chelsey Redondo Raquel James, MD

## 2022-04-18 NOTE — Telephone Encounter (Signed)
   Pre-operative Risk Assessment    Patient Name: [name]  DOB: 1945-10-30 MRN: [id number]      Request for Surgical Clearance    Procedure:   HERNIA REPAIR  Date of Surgery:  Clearance TBD                                 Surgeon:  DR. Jens Som Surgeon's Group or Practice Name:  [facility] Phone number:  [phone] Fax number:  [phone] ATTN: Karren Cobble, CMA   Type of Clearance Requested:   - Medical ; NO MEDICATIONS LISTED AS NEEDING TO BE HELD   Type of Anesthesia:  General    Additional requests/questions:    Jiles Prows   04/18/2022, 3:32 PM

## 2022-04-21 ENCOUNTER — Telehealth: Payer: Self-pay | Admitting: *Deleted

## 2022-04-21 NOTE — Telephone Encounter (Signed)
Pt has been scheduled for tele pre op appt 04/25/22 @ 9:40. Med rec and consent are done.     Patient Consent for Virtual Visit        [name] has provided verbal consent on 04/21/2022 for a virtual visit (video or telephone).   CONSENT FOR VIRTUAL VISIT FOR:  [name]  By participating in this virtual visit I agree to the following:  I hereby voluntarily request, consent and authorize [name] and its employed or contracted physicians, physician assistants, nurse practitioners or other licensed health care professionals (the Practitioner), to provide me with telemedicine health care services (the "Services") as deemed necessary by the treating Practitioner. I acknowledge and consent to receive the Services by the Practitioner via telemedicine. I understand that the telemedicine visit will involve communicating with the Practitioner through live audiovisual communication technology and the disclosure of certain medical information by electronic transmission. I acknowledge that I have been given the opportunity to request an in-person assessment or other available alternative prior to the telemedicine visit and am voluntarily participating in the telemedicine visit.  I understand that I have the right to withhold or withdraw my consent to the use of telemedicine in the course of my care at any time, without affecting my right to future care or treatment, and that the Practitioner or I may terminate the telemedicine visit at any time. I understand that I have the right to inspect all information obtained and/or recorded in the course of the telemedicine visit and may receive copies of available information for a reasonable fee.  I understand that some of the potential risks of receiving the Services via telemedicine include:  Delay or interruption in medical evaluation due to technological equipment failure or disruption; Information transmitted may not be  sufficient (e.g. poor resolution of images) to allow for appropriate medical decision making by the Practitioner; and/or  In rare instances, security protocols could fail, causing a breach of personal health information.  Furthermore, I acknowledge that it is my responsibility to provide information about my medical history, conditions and care that is complete and accurate to the best of my ability. I acknowledge that Practitioner's advice, recommendations, and/or decision may be based on factors not within their control, such as incomplete or inaccurate data provided by me or distortions of diagnostic images or specimens that may result from electronic transmissions. I understand that the practice of medicine is not an exact science and that Practitioner makes no warranties or guarantees regarding treatment outcomes. I acknowledge that a copy of this consent can be made available to me via my patient portal ([name]), or I can request a printed copy by calling the office of [name].    I understand that my insurance will be billed for this visit.   I have read or had this consent read to me. I understand the contents of this consent, which adequately explains the benefits and risks of the Services being provided via telemedicine.  I have been provided ample opportunity to ask questions regarding this consent and the Services and have had my questions answered to my satisfaction. I give my informed consent for the services to be provided through the use of telemedicine in my medical care

## 2022-04-21 NOTE — Telephone Encounter (Signed)
Pt has been scheduled for tele pre op appt 04/25/22 @ 9:40. Med rec and consent are done.

## 2022-04-25 ENCOUNTER — Ambulatory Visit: Payer: Medicare PPO | Attending: Internal Medicine | Admitting: Physician Assistant

## 2022-04-25 DIAGNOSIS — Z01810 Encounter for preprocedural cardiovascular examination: Secondary | ICD-10-CM | POA: Diagnosis not present

## 2022-04-25 NOTE — Progress Notes (Signed)
Virtual Visit via Telephone Note   Because of [name]'s co-morbid illnesses, he is at least at moderate risk for complications without adequate follow up.  This format is felt to be most appropriate for this patient at this time.  The patient did not have access to video technology/had technical difficulties with video requiring transitioning to audio format only (telephone).  All issues noted in this document were discussed and addressed.  No physical exam could be performed with this format.  Please refer to the patient's chart for his consent to telehealth for [facility].  Evaluation Performed:  Preoperative cardiovascular risk assessment _____________   Date:  04/25/2022   Patient ID:  [name], DOB 30-May-1945, MRN [id number] Patient Location:  Home Provider location:   Office  Primary Care Provider:  Denita Lung, MD Primary Cardiologist:  Gregory Moores, MD  Chief Complaint / Patient Profile   77 y.o. y/o male with a h/o HIV, psoriatic arhtritis, HTN, and CAD s/p CABG (2000) who is pending [name] and presents today for telephonic preoperative cardiovascular risk assessment.  History of Present Illness    [name] is a 77 y.o. male who presents via audio/video conferencing for a telehealth visit today.  Pt was last seen in cardiology clinic on 10/08/21 by Dr. Acie Allison.  At that time [name] was doing well.  The patient is now pending procedure as outlined above. Since his last visit, he had a repeat echo that showed preserved EF, grade 2 DD, and no regional wall motion abnormalities. He continues to walk 2 miles daily without angina.   Past Medical History    Past Medical History:  Diagnosis Date   Allergy    RHINITIS   Arthritis    ASHD (arteriosclerotic heart disease)    Former smoker QUIT 08/09/2010   Herpes labialis    HH (hiatus Gregory)    History of kidney stones    HIV infection ([place])     POSITIVE   Hypertriglyceridemia    Psoriasis    ARTHRITIS   Renal stones    Tinnitus    CHRONIC   Past Surgical History:  Procedure Laterality Date   CORONARY ARTERY BYPASS GRAFT  08/20/1998   CABG x4: LIMA-LAD, SVG-Intermediate, SVG-distal CX, SVG-PDA   LUMBAR LAMINECTOMY/DECOMPRESSION MICRODISCECTOMY Left 10/01/2020   Procedure: Laminectomy - left - Lumbar three-Lumbar four - Lumbar four-Lumbar five microdiskectomy;  Surgeon: Gregory Moore, MD;  Location: [facility];  Service: Neurosurgery;  Laterality: Left;    Allergies  No Known Allergies  Home Medications    Prior to Admission medications   Medication Sig Start Date End Date Taking? Authorizing Provider  acetaminophen (TYLENOL) 500 MG tablet Take 1,000 mg by mouth every 8 (eight) hours as needed for moderate pain.    [provider]  celecoxib (CELEBREX) 200 MG capsule Take 1 capsule (200 mg total) by mouth 2 (two) times daily. 04/01/22   Gregory Lung, MD  dolutegravir-lamiVUDine (DOVATO) 50-300 MG tablet Take 1 tablet by mouth daily. 04/01/22   Gregory Lung, MD  dronabinol (MARINOL) 2.5 MG capsule Take 1 capsule (2.5 mg total) by mouth 2 (two) times daily before a meal. 10/31/20   Gregory Lung, MD  ezetimibe (ZETIA) 10 MG tablet Take 1 tablet (10 mg total) by mouth daily. 04/01/22   Gregory Lung, MD  losartan-hydrochlorothiazide (HYZAAR) 50-12.5 MG tablet Take 1 tablet by mouth daily. 04/01/22  Gregory Lung, MD  Multiple Vitamins-Minerals (MULTIVITAMIN WITH MINERALS) tablet Take 1 tablet by mouth daily.    [provider]  nitroGLYCERIN (NITROSTAT) 0.3 MG SL tablet Place 1 tablet (0.3 mg total) under the tongue every 5 (five) minutes as needed for chest pain. 09/18/14   Gregory Lung, MD  oxyCODONE-acetaminophen (PERCOCET/ROXICET) 5-325 MG tablet Take 1 tablet by mouth every 4 (four) hours as needed for severe pain. Patient not taking: Reported on 03/25/2022 10/01/20   Gregory Moore, MD  rosuvastatin  (CRESTOR) 20 MG tablet Take 1 tablet (20 mg total) by mouth daily. 04/01/22   Gregory Lung, MD  Suvorexant (BELSOMRA) 10 MG TABS Take 1 tablet (10 mg total) by mouth at bedtime as needed. 04/01/22   Gregory Lung, MD  tadalafil (CIALIS) 5 MG tablet TAKE 1 TABLET BY MOUTH DAILY AS NEEDED FOR ERECTILE DYSFUNCTION. 04/01/22   Gregory Lung, MD  valACYclovir (VALTREX) 1000 MG tablet 2 tablets twice a day for 1 day 04/01/18   Gregory Lung, MD    Physical Exam    Vital Signs:  [name] does not have vital signs available for review today.  Given telephonic nature of communication, physical exam is limited. AAOx3. NAD. Normal affect.  Speech and respirations are unlabored.  Accessory Clinical Findings    None  Assessment & Plan    1.  Preoperative Cardiovascular Risk Assessment:  He has a history of CAD with prior CABG, but no history of CVA, insulin dependence or creatinine greater than 2. According to the RCRI, he has a 0.9% risk of MACE during the perioperative period. If this surgery will be open abdominal, this would increase his risk to 6.6%. He is able to complete greater than 4.0 METS (walks 2 miles daily) without chest pain. He has a history of diastolic dysfunction without heart failure. I do not think he has any unstable cardiac conditions at this time. He understands his risk and wishes to proceed.   Therefore, based on ACC/AHA guidelines, the patient would be at acceptable risk for the planned procedure without further cardiovascular testing.   The patient was advised that if he develops new symptoms prior to surgery to contact our office to arrange for a follow-up visit, and he verbalized understanding.   A copy of this note will be routed to requesting surgeon.  Time:   Today, I have spent 10 minutes with the patient with telehealth technology discussing medical history, symptoms, and management plan.     Gregory Lin Melodee Lupe, PA  04/25/2022, 9:59 AM

## 2022-05-06 DIAGNOSIS — L821 Other seborrheic keratosis: Secondary | ICD-10-CM | POA: Diagnosis not present

## 2022-05-06 DIAGNOSIS — L570 Actinic keratosis: Secondary | ICD-10-CM | POA: Diagnosis not present

## 2022-05-13 NOTE — Patient Instructions (Signed)
DUE TO COVID-19 ONLY TWO VISITORS  (aged 77 and older)  ARE ALLOWED TO COME WITH YOU AND STAY IN THE WAITING ROOM ONLY DURING PRE OP AND PROCEDURE.   **NO VISITORS ARE ALLOWED IN THE SHORT STAY AREA OR RECOVERY ROOM!!**  IF YOU WILL BE ADMITTED INTO THE HOSPITAL YOU ARE ALLOWED ONLY FOUR SUPPORT PEOPLE DURING VISITATION HOURS ONLY (7 AM -8PM)   The support person(s) must pass our screening, gel in and out, and wear a mask at all times, including in the patient's room. Patients must also wear a mask when staff or their support person are in the room. Visitors GUEST BADGE MUST BE WORN VISIBLY  One adult visitor may remain with you overnight and MUST be in the room by 8 P.M.     Your procedure is scheduled on: 05/23/22   Report to [facility] Main Entrance    Report to admitting at : 8:15 AM   Call this number if you have problems the morning of surgery [phone]   Do not eat food :After Midnight.   After Midnight you may have the following liquids until : 7:30 AM DAY OF SURGERY  Water Black Coffee (sugar ok, NO MILK/CREAM OR CREAMERS)  Tea (sugar ok, NO MILK/CREAM OR CREAMERS) regular and decaf                             Plain Jell-O (NO RED)                                           Fruit ices (not with fruit pulp, NO RED)                                     Popsicles (NO RED)                                                                  Juice: apple, WHITE grape, WHITE cranberry Sports drinks like Gatorade (NO RED)           Oral Hygiene is also important to reduce your risk of infection.                                    Remember - BRUSH YOUR TEETH THE MORNING OF SURGERY WITH YOUR REGULAR TOOTHPASTE  DENTURES WILL BE REMOVED PRIOR TO SURGERY PLEASE DO NOT APPLY "Poly grip" OR ADHESIVES!!!   Do NOT smoke after Midnight   Take these medicines the morning of surgery with A SIP OF WATER: Tylenol as needed.                              You may not have any metal on  your body including hair pins, jewelry, and body piercing              Men may shave face and neck.   Do not bring valuables  to the hospital. [place].   Contacts, glasses, or bridgework may not be worn into surgery.   Bring small overnight bag day of surgery.   DO NOT [place]. PHARMACY WILL DISPENSE MEDICATIONS LISTED ON YOUR MEDICATION LIST TO YOU DURING YOUR ADMISSION [facility]!    Patients discharged on the day of surgery will not be allowed to drive home.  Someone NEEDS to stay with you for the first 24 hours after anesthesia.   Special Instructions: Bring a copy of your healthcare power of attorney and living will documents         the day of surgery if you haven't scanned them before.              Please read over the following fact sheets you were given: IF YOU HAVE QUESTIONS ABOUT YOUR PRE-OP INSTRUCTIONS PLEASE CALL [phone]    [facility] - Preparing for Surgery Before surgery, you can play an important role.  Because skin is not sterile, your skin needs to be as free of germs as possible.  You can reduce the number of germs on your skin by washing with CHG (chlorahexidine gluconate) soap before surgery.  CHG is an antiseptic cleaner which kills germs and bonds with the skin to continue killing germs even after washing. Please DO NOT use if you have an allergy to CHG or antibacterial soaps.  If your skin becomes reddened/irritated stop using the CHG and inform your nurse when you arrive at Short Stay. Do not shave (including legs and underarms) for at least 48 hours prior to the first CHG shower.  You may shave your face/neck. Please follow these instructions carefully:  1.  Shower with CHG Soap the night before surgery and the  morning of Surgery.  2.  If you choose to wash your hair, wash your hair first as usual with your  normal  shampoo.  3.  After you shampoo, rinse your hair  and body thoroughly to remove the  shampoo.                           4.  Use CHG as you would any other liquid soap.  You can apply chg directly  to the skin and wash                       Gently with a scrungie or clean washcloth.  5.  Apply the CHG Soap to your body ONLY FROM THE NECK DOWN.   Do not use on face/ open                           Wound or open sores. Avoid contact with eyes, ears mouth and genitals (private parts).                       Wash face,  Genitals (private parts) with your normal soap.             6.  Wash thoroughly, paying special attention to the area where your surgery  will be performed.  7.  Thoroughly rinse your body with warm water from the neck down.  8.  DO NOT shower/wash with your normal soap after using and rinsing off  the CHG Soap.                9.  Pat yourself dry with a clean towel.            10.  Wear clean pajamas.            11.  Place clean sheets on your bed the night of your first shower and do not  sleep with pets. Day of Surgery : Do not apply any lotions/deodorants the morning of surgery.  Please wear clean clothes to the hospital/surgery center.  FAILURE TO FOLLOW THESE INSTRUCTIONS MAY RESULT IN THE CANCELLATION OF YOUR SURGERY PATIENT SIGNATURE_________________________________  NURSE SIGNATURE__________________________________  ________________________________________________________________________

## 2022-05-14 ENCOUNTER — Encounter (HOSPITAL_COMMUNITY)
Admission: RE | Admit: 2022-05-14 | Discharge: 2022-05-14 | Disposition: A | Discharge status: Home / Self Care | Payer: Medicare PPO | Source: Ambulatory Visit | Attending: Surgery | Admitting: Surgery

## 2022-05-14 ENCOUNTER — Other Ambulatory Visit: Payer: Self-pay

## 2022-05-14 ENCOUNTER — Encounter (HOSPITAL_COMMUNITY): Payer: Self-pay

## 2022-05-14 VITALS — BP 147/83 | HR 79 | Temp 98.0°F | Ht 67.0 in | Wt 123.0 lb

## 2022-05-14 DIAGNOSIS — I129 Hypertensive chronic kidney disease with stage 1 through stage 4 chronic kidney disease, or unspecified chronic kidney disease: Secondary | ICD-10-CM | POA: Diagnosis not present

## 2022-05-14 DIAGNOSIS — Z951 Presence of aortocoronary bypass graft: Secondary | ICD-10-CM | POA: Diagnosis not present

## 2022-05-14 DIAGNOSIS — I10 Essential (primary) hypertension: Secondary | ICD-10-CM

## 2022-05-14 DIAGNOSIS — I2510 Atherosclerotic heart disease of native coronary artery without angina pectoris: Secondary | ICD-10-CM | POA: Diagnosis not present

## 2022-05-14 DIAGNOSIS — Z87891 Personal history of nicotine dependence: Secondary | ICD-10-CM | POA: Insufficient documentation

## 2022-05-14 DIAGNOSIS — Z21 Asymptomatic human immunodeficiency virus [HIV] infection status: Secondary | ICD-10-CM | POA: Diagnosis not present

## 2022-05-14 DIAGNOSIS — N183 Chronic kidney disease, stage 3 unspecified: Secondary | ICD-10-CM | POA: Insufficient documentation

## 2022-05-14 DIAGNOSIS — K469 Unspecified abdominal hernia without obstruction or gangrene: Secondary | ICD-10-CM | POA: Diagnosis not present

## 2022-05-14 DIAGNOSIS — Z01812 Encounter for preprocedural laboratory examination: Secondary | ICD-10-CM | POA: Insufficient documentation

## 2022-05-14 HISTORY — DX: Essential (primary) hypertension: I10

## 2022-05-14 HISTORY — DX: Malignant (primary) neoplasm, unspecified: C80.1

## 2022-05-14 LAB — CBC
HCT: 39.6 % (ref 39.0–52.0)
Hemoglobin: 13.5 g/dL (ref 13.0–17.0)
MCH: 34.9 pg — ABNORMAL HIGH (ref 26.0–34.0)
MCHC: 34.1 g/dL (ref 30.0–36.0)
MCV: 102.3 fL — ABNORMAL HIGH (ref 80.0–100.0)
Platelets: 109 10*3/uL — ABNORMAL LOW (ref 150–400)
RBC: 3.87 MIL/uL — ABNORMAL LOW (ref 4.22–5.81)
RDW: 12.5 % (ref 11.5–15.5)
WBC: 4 10*3/uL (ref 4.0–10.5)
nRBC: 0 % (ref 0.0–0.2)

## 2022-05-14 LAB — BASIC METABOLIC PANEL
Anion gap: 10 (ref 5–15)
BUN: 34 mg/dL — ABNORMAL HIGH (ref 8–23)
CO2: 23 mmol/L (ref 22–32)
Calcium: 9.6 mg/dL (ref 8.9–10.3)
Chloride: 106 mmol/L (ref 98–111)
Creatinine, Ser: 2.09 mg/dL — ABNORMAL HIGH (ref 0.61–1.24)
GFR, Estimated: 32 mL/min — ABNORMAL LOW (ref >60)
Glucose, Bld: 93 mg/dL (ref 70–99)
Potassium: 4.4 mmol/L (ref 3.5–5.1)
Sodium: 139 mmol/L (ref 135–145)

## 2022-05-14 NOTE — Progress Notes (Signed)
For Short Stay: [facility] appointment date:  Bowel Prep reminder: N/A   For Anesthesia: PCP - Dr. Jill Alexanders Cardiologist - Dr. Mertie Moores. LOV: 10/08/21. Clearance: Fabian Sharp: PA: 04/25/22  Chest x-ray -  EKG - 10/08/21 Stress Test -  ECHO - 10/21/21 Cardiac Cath -  Pacemaker/ICD device last checked: Pacemaker orders received: Device Rep notified:  Spinal Cord Stimulator:  Sleep Study -  CPAP -   Fasting Blood Sugar -  Checks Blood Sugar _____ times a day Date and result of last Hgb A1c-  Last dose of GLP1 agonist-  GLP1 instructions:   Last dose of SGLT-2 inhibitors-  SGLT-2 instructions:   Blood Thinner Instructions: Aspirin Instructions: Last Dose:  Activity level: Can go up a flight of stairs and activities of daily living without stopping and without chest pain and/or shortness of breath   Able to exercise without chest pain and/or shortness of breath  Anesthesia review: Hx: HTN,CAD(CABG),Smoker  Patient denies shortness of breath, fever, cough and chest pain at PAT appointment   Patient verbalized understanding of instructions that were given to them at the PAT appointment. Patient was also instructed that they will need to review over the PAT instructions again at home before surgery.

## 2022-05-14 NOTE — Progress Notes (Signed)
Lab. Results: Creatinine: 2.09. Platelets: 109.

## 2022-05-15 NOTE — Progress Notes (Signed)
Anesthesia Chart Review   Case: [id number] Date/Time: 05/23/22 [id number]   Procedure: OPEN BILATERAL INGUINAL HERNIA REPAIRS (Bilateral)   Anesthesia type: General   Pre-op diagnosis: HERNIAS   Location: [facility] ROOM 01 / [facility]   Surgeons: Clovis Riley, MD       DISCUSSION:77 y.o. some day smoker with h/o HIV, HTN, CKD Stage III, CAD (CABG 2000), hernia scheduled for above procedure 05/23/2022 with Dr. Romana Juniper.   Pt last seen by cardiology 04/25/2022. Per OV note, "He has a history of CAD with prior CABG, but no history of CVA, insulin dependence or creatinine greater than 2. According to the RCRI, he has a 0.9% risk of MACE during the perioperative period. If this surgery will be open abdominal, this would increase his risk to 6.6%. He is able to complete greater than 4.0 METS (walks 2 miles daily) without chest pain. He has a history of diastolic dysfunction without heart failure. I do not think he has any unstable cardiac conditions at this time. He understands his risk and wishes to proceed.    Therefore, based on ACC/AHA guidelines, the patient would be at acceptable risk for the planned procedure without further cardiovascular testing."  Creatinine 1.91 03/07/2021 Creatinine 1.83 09/16/2021 Creatinine 1.86 04/01/2022 Creatinine 2.09 05/14/2022 VS: BP (!) 147/83   Pulse 79   Temp 36.7 C (Oral)   Ht '5\' 7"'$  (1.702 m)   Wt 55.8 kg   SpO2 98%   BMI 19.26 kg/m   PROVIDERS: Denita Lung, MD is PCP   Primary Cardiologist:  Mertie Moores, MD  LABS: Labs reviewed: Acceptable for surgery. (all labs ordered are listed, but only abnormal results are displayed)  Labs Reviewed  CBC - Abnormal; Notable for the following components:      Result Value   RBC 3.87 (*)    MCV 102.3 (*)    MCH 34.9 (*)    Platelets 109 (*)    All other components within normal limits  BASIC METABOLIC PANEL - Abnormal; Notable for the following components:   BUN 34 (*)    Creatinine, Ser 2.09 (*)     GFR, Estimated 32 (*)    All other components within normal limits     IMAGES:   EKG:   CV: Echo 10/21/2021 1. Left ventricular ejection fraction by 3D volume is 52 %. The left  ventricle has low normal function. The left ventricle demonstrates global  hypokinesis. Left ventricular diastolic parameters are consistent with  Grade II diastolic dysfunction  (pseudonormalization).   2. Right ventricular systolic function is normal. The right ventricular  size is normal. There is normal pulmonary artery systolic pressure.   3. Left atrial size was mildly dilated.   4. The mitral valve is myxomatous. Mild mitral valve regurgitation. No  evidence of mitral stenosis.   5. Tricuspid valve regurgitation is mild to moderate.   6. The aortic valve is tricuspid. There is moderate thickening of the  aortic valve. Aortic valve regurgitation is mild to moderate. Aortic valve  sclerosis/calcification is present, without any evidence of aortic  stenosis.   7. The inferior vena cava is normal in size with <50% respiratory  variability, suggesting right atrial pressure of 8 mmHg.  Past Medical History:  Diagnosis Date   Allergy    RHINITIS   Arthritis    ASHD (arteriosclerotic heart disease)    Cancer (HCC)    skin cancer   Former smoker QUIT 08/09/2010   Herpes labialis  HH (hiatus hernia)    History of kidney stones    HIV infection ([place])    POSITIVE   Hypertension    Hypertriglyceridemia    Psoriasis    ARTHRITIS   Tinnitus    CHRONIC    Past Surgical History:  Procedure Laterality Date   APPENDECTOMY     CORONARY ARTERY BYPASS GRAFT  08/20/1998   CABG x4: LIMA-LAD, SVG-Intermediate, SVG-distal CX, SVG-PDA   LUMBAR LAMINECTOMY/DECOMPRESSION MICRODISCECTOMY Left 10/01/2020   Procedure: Laminectomy - left - Lumbar three-Lumbar four - Lumbar four-Lumbar five microdiskectomy;  Surgeon: Eustace Moore, MD;  Location: [facility];  Service: Neurosurgery;  Laterality: Left;     MEDICATIONS:  acetaminophen (TYLENOL) 500 MG tablet   celecoxib (CELEBREX) 200 MG capsule   dolutegravir-lamiVUDine (DOVATO) 50-300 MG tablet   ezetimibe (ZETIA) 10 MG tablet   losartan-hydrochlorothiazide (HYZAAR) 50-12.5 MG tablet   Multiple Vitamins-Minerals (MULTIVITAMIN WITH MINERALS) tablet   nitroGLYCERIN (NITROSTAT) 0.3 MG SL tablet   rosuvastatin (CRESTOR) 20 MG tablet   Suvorexant (BELSOMRA) 10 MG TABS   tadalafil (CIALIS) 5 MG tablet   valACYclovir (VALTREX) 1000 MG tablet   dronabinol (MARINOL) 2.5 MG capsule   oxyCODONE-acetaminophen (PERCOCET/ROXICET) 5-325 MG tablet   No current facility-administered medications for this encounter.    Konrad Felix Ward, PA-C [facility] [phone]

## 2022-05-15 NOTE — Anesthesia Preprocedure Evaluation (Addendum)
Anesthesia Evaluation  Patient identified by MRN, date of birth, ID band Patient awake    Reviewed: Allergy & Precautions, NPO status , Patient's Chart, lab work & pertinent test results  History of Anesthesia Complications Negative for: history of anesthetic complications  Airway Mallampati: III  TM Distance: >3 FB Neck ROM: Full    Dental  (+) Teeth Intact, Dental Advisory Given   Pulmonary Current Smoker   breath sounds clear to auscultation       Cardiovascular hypertension, Pt. on medications + CAD and + CABG (2000)   Rhythm:Regular Rate:Normal  TTE 10/21/21: EF 52%, global hypokinesis, grade II DD, mild LAE, mild MR, mild to moderate TR, mild to moderate AR      Neuro/Psych negative neurological ROS     GI/Hepatic Neg liver ROS, hiatal hernia,GERD  Controlled,,  Endo/Other  negative endocrine ROS    Renal/GU Renal InsufficiencyRenal disease (Cr 2.09)     Musculoskeletal  (+) Arthritis ,    Abdominal   Peds  Hematology  (+) HIVPlt 109k   Anesthesia Other Findings Day of surgery medications reviewed with patient.  Reproductive/Obstetrics                             Anesthesia Physical Anesthesia Plan  ASA: 3  Anesthesia Plan: General   Post-op Pain Management: Tylenol PO (pre-op)*   Induction: Intravenous  PONV Risk Score and Plan: 1 and Treatment may vary due to age or medical condition, Dexamethasone and Ondansetron  Airway Management Planned: Oral ETT  Additional Equipment: None  Intra-op Plan:   Post-operative Plan: Extubation in OR  Informed Consent: I have reviewed the patients History and Physical, chart, labs and discussed the procedure including the risks, benefits and alternatives for the proposed anesthesia with the patient or authorized representative who has indicated his/her understanding and acceptance.     Dental advisory given  Plan Discussed with:  CRNA  Anesthesia Plan Comments: (See PAT note 05/14/2022)       Anesthesia Quick Evaluation

## 2022-05-22 NOTE — Progress Notes (Signed)
Attempted to call pt.to notify him of his surgery time on 05/23/22. No answer at home phone or mobile phone. Message already left.

## 2022-05-22 NOTE — H&P (Signed)
 Nolton Depuy [id number]   Referring Provider:  Lalonde, John Charles, *   Subjective   Chief Complaint: New Consultation (- Bilateral inguinal hernia)     History of Present Illness:    Very has not 77-year-old male with history of arthritis, arteriosclerotic heart disease status post 4v CABG in 2000 (cardiologist is Dr. Nahser), former tobacco abuse (quit 2012), hiatal hernia, kidney stones, HIV, hypertriglyceridemia, psoriasis, chronic kidney disease (last creatinine 1.86 last month) presents for evaluation of bilateral inguinal hernias.  He has known about these for several years.  They do not cause him any pain or other symptoms but they have increased in size over time and he now feels ready to have them repaired.  He had a CT scan done in October 2021 which I have reviewed; this demonstrates a very tiny fat-containing umbilical hernia as well as bilateral inguinal hernias with small bowel entering the right side but only fat on the left.  Both sides are fairly sizable defects.  Additional findings noted concern for prostate cancer, diverticulosis, aortic atherosclerosis, hepatic steatosis, subcentimeter left adrenal nodule.  Denies tobacco use.  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  He walks 2 to 4 miles a day.   Review of Systems: A complete review of systems was obtained from the patient.  I have reviewed this information and discussed as appropriate with the patient.  See HPI as well for other ROS.   Medical History: Past Medical History:  Diagnosis Date   Arthritis    Hyperlipidemia     There is no problem list on file for this patient.   Past Surgical History:  Procedure Laterality Date   SPINE SURGERY       No Known Allergies  Current Outpatient Medications on File Prior to Visit  Medication Sig Dispense Refill   celecoxib (CELEBREX) 200 MG capsule Take 200 mg by mouth 2 (two) times daily     DOVATO 50-300 mg Tab Take 1 tablet by  mouth once daily     losartan-hydroCHLOROthiazide (HYZAAR) 50-12.5 mg tablet Take 1 tablet by mouth once daily     rosuvastatin (CRESTOR) 20 MG tablet Take 20 mg by mouth once daily     ezetimibe (ZETIA) 10 mg tablet Take 10 mg by mouth once daily     No current facility-administered medications on file prior to visit.    Family History  Problem Relation Age of Onset   Hyperlipidemia (Elevated cholesterol) Mother    Diabetes Mother      Social History   Tobacco Use  Smoking Status Every Day   Types: Cigarettes  Smokeless Tobacco Never     Social History   Socioeconomic History   Marital status: Single  Tobacco Use   Smoking status: Every Day    Types: Cigarettes   Smokeless tobacco: Never  Substance and Sexual Activity   Alcohol use: Not Currently   Drug use: Never    Objective:    Vitals:   04/18/22 1032  BP: (!) 154/78  Pulse: 86  Temp: 36.7 C (98 F)  SpO2: 98%  Weight: 58.1 kg (128 lb)  Height: 165.1 cm (5' 5")    Body mass index is 21.3 kg/m.  Gen: A&Ox3, no distress  Unlabored respirations Abdomen soft and nontender.  There is a chronically incarcerated small umbilical hernia containing fat which is nontender.  He does have large bilateral inguinal hernias.  Assessment and Plan:  Diagnoses and all orders for this visit:    Non-recurrent bilateral inguinal hernia without obstruction or gangrene    These are quite large and based on the CT scan he had 2-1/2 years ago, there is essentially disruption of the inguinal floor.  I do think these would be best managed with an open repair to afford an attempt at reconstructing the inguinal floor.  I discussed this with him.  We went over the surgery and discussed the relevant anatomy and we discussed the technique of the procedure.  Discussed risks of bleeding, infection, pain, scarring, injury to structures in the area including nerves, blood vessels, bowel, bladder, risk of chronic pain, hernia recurrence,  risk of seroma or hematoma, urinary retention, and risks of general anesthesia including cardiovascular, pulmonary, and thromboembolic complications.  Discussed postoperative activity limitations and expected timeline.  Questions were answered.  Patient wishes to proceed with scheduling.   Ceili Boshers AMANDA Lillymae Duet, MD  

## 2022-05-23 ENCOUNTER — Other Ambulatory Visit: Payer: Self-pay

## 2022-05-23 ENCOUNTER — Ambulatory Visit (HOSPITAL_COMMUNITY)
Admission: RE | Admit: 2022-05-23 | Discharge: 2022-05-23 | Disposition: A | Discharge status: Home / Self Care | Payer: Medicare PPO | Source: Ambulatory Visit | Attending: Surgery | Admitting: Surgery

## 2022-05-23 ENCOUNTER — Ambulatory Visit (HOSPITAL_BASED_OUTPATIENT_CLINIC_OR_DEPARTMENT_OTHER): Payer: Medicare PPO

## 2022-05-23 ENCOUNTER — Ambulatory Visit (HOSPITAL_COMMUNITY): Payer: Medicare PPO | Admitting: Physician Assistant

## 2022-05-23 ENCOUNTER — Encounter (HOSPITAL_COMMUNITY): Payer: Self-pay | Admitting: Surgery

## 2022-05-23 ENCOUNTER — Encounter (HOSPITAL_COMMUNITY)
Admission: RE | Disposition: A | Discharge status: Home / Self Care | Payer: Self-pay | Source: Ambulatory Visit | Attending: Surgery

## 2022-05-23 DIAGNOSIS — I2510 Atherosclerotic heart disease of native coronary artery without angina pectoris: Secondary | ICD-10-CM | POA: Insufficient documentation

## 2022-05-23 DIAGNOSIS — Z87891 Personal history of nicotine dependence: Secondary | ICD-10-CM | POA: Diagnosis not present

## 2022-05-23 DIAGNOSIS — K4020 Bilateral inguinal hernia, without obstruction or gangrene, not specified as recurrent: Secondary | ICD-10-CM | POA: Insufficient documentation

## 2022-05-23 DIAGNOSIS — K219 Gastro-esophageal reflux disease without esophagitis: Secondary | ICD-10-CM | POA: Diagnosis not present

## 2022-05-23 DIAGNOSIS — Z951 Presence of aortocoronary bypass graft: Secondary | ICD-10-CM | POA: Diagnosis not present

## 2022-05-23 DIAGNOSIS — I10 Essential (primary) hypertension: Secondary | ICD-10-CM | POA: Diagnosis not present

## 2022-05-23 DIAGNOSIS — N189 Chronic kidney disease, unspecified: Secondary | ICD-10-CM | POA: Insufficient documentation

## 2022-05-23 DIAGNOSIS — F17200 Nicotine dependence, unspecified, uncomplicated: Secondary | ICD-10-CM | POA: Diagnosis not present

## 2022-05-23 DIAGNOSIS — F17210 Nicotine dependence, cigarettes, uncomplicated: Secondary | ICD-10-CM

## 2022-05-23 DIAGNOSIS — M1990 Unspecified osteoarthritis, unspecified site: Secondary | ICD-10-CM | POA: Insufficient documentation

## 2022-05-23 DIAGNOSIS — E785 Hyperlipidemia, unspecified: Secondary | ICD-10-CM | POA: Insufficient documentation

## 2022-05-23 DIAGNOSIS — Z79899 Other long term (current) drug therapy: Secondary | ICD-10-CM | POA: Insufficient documentation

## 2022-05-23 DIAGNOSIS — Z21 Asymptomatic human immunodeficiency virus [HIV] infection status: Secondary | ICD-10-CM | POA: Diagnosis not present

## 2022-05-23 DIAGNOSIS — B20 Human immunodeficiency virus [HIV] disease: Secondary | ICD-10-CM | POA: Insufficient documentation

## 2022-05-23 DIAGNOSIS — I129 Hypertensive chronic kidney disease with stage 1 through stage 4 chronic kidney disease, or unspecified chronic kidney disease: Secondary | ICD-10-CM | POA: Insufficient documentation

## 2022-05-23 HISTORY — PX: INGUINAL HERNIA REPAIR: SHX194

## 2022-05-23 SURGERY — REPAIR, HERNIA, INGUINAL, BILATERAL, ADULT
Anesthesia: General | Site: Abdomen | Laterality: Bilateral

## 2022-05-23 MED ORDER — FENTANYL CITRATE PF 50 MCG/ML IJ SOSY: 25.0000 ug | PREFILLED_SYRINGE | INTRAMUSCULAR | Status: DC | PRN

## 2022-05-23 MED ORDER — FENTANYL CITRATE (PF) 100 MCG/2ML IJ SOLN
INTRAMUSCULAR | Status: AC
  Filled 2022-05-23: qty 2

## 2022-05-23 MED ORDER — SODIUM CHLORIDE 0.9% FLUSH: 3.0000 mL | INTRAVENOUS | Status: DC | PRN

## 2022-05-23 MED ORDER — GABAPENTIN 300 MG PO CAPS
300.0000 mg | ORAL_CAPSULE | ORAL | Status: AC
  Administered 2022-05-23: 300 mg via ORAL
  Filled 2022-05-23: qty 1

## 2022-05-23 MED ORDER — EPHEDRINE SULFATE (PRESSORS) 50 MG/ML IJ SOLN
INTRAMUSCULAR | Status: DC | PRN
  Administered 2022-05-23: 5 mg via INTRAVENOUS
  Administered 2022-05-23: 10 mg via INTRAVENOUS
  Administered 2022-05-23 (×2): 5 mg via INTRAVENOUS

## 2022-05-23 MED ORDER — PHENYLEPHRINE HCL (PRESSORS) 10 MG/ML IV SOLN
INTRAVENOUS | Status: DC | PRN
  Administered 2022-05-23: 160 ug via INTRAVENOUS
  Administered 2022-05-23 (×6): 80 ug via INTRAVENOUS

## 2022-05-23 MED ORDER — PROPOFOL 10 MG/ML IV BOLUS
INTRAVENOUS | Status: AC
  Filled 2022-05-23: qty 20

## 2022-05-23 MED ORDER — FENTANYL CITRATE PF 50 MCG/ML IJ SOSY
25.0000 ug | PREFILLED_SYRINGE | INTRAMUSCULAR | Status: DC | PRN
  Administered 2022-05-23: 50 ug via INTRAVENOUS

## 2022-05-23 MED ORDER — SODIUM CHLORIDE 0.9 % IV SOLN: 250.0000 mL | INTRAVENOUS | Status: DC | PRN

## 2022-05-23 MED ORDER — OXYCODONE HCL 5 MG/5ML PO SOLN: 5.0000 mg | Freq: Once | ORAL | Status: AC | PRN

## 2022-05-23 MED ORDER — CHLORHEXIDINE GLUCONATE 4 % EX LIQD: 60.0000 mL | Freq: Once | CUTANEOUS | Status: DC

## 2022-05-23 MED ORDER — ROCURONIUM BROMIDE 100 MG/10ML IV SOLN
INTRAVENOUS | Status: DC | PRN
  Administered 2022-05-23: 50 mg via INTRAVENOUS

## 2022-05-23 MED ORDER — ACETAMINOPHEN 325 MG PO TABS: 650.0000 mg | ORAL_TABLET | ORAL | Status: DC | PRN

## 2022-05-23 MED ORDER — BUPIVACAINE LIPOSOME 1.3 % IJ SUSP
INTRAMUSCULAR | Status: DC | PRN
  Administered 2022-05-23: 20 mL

## 2022-05-23 MED ORDER — LIDOCAINE HCL (PF) 2 % IJ SOLN
INTRAMUSCULAR | Status: AC
  Filled 2022-05-23: qty 5

## 2022-05-23 MED ORDER — OXYCODONE HCL 5 MG PO TABS
ORAL_TABLET | ORAL | Status: AC
  Filled 2022-05-23: qty 1

## 2022-05-23 MED ORDER — DEXAMETHASONE SODIUM PHOSPHATE 10 MG/ML IJ SOLN
INTRAMUSCULAR | Status: DC | PRN
  Administered 2022-05-23: 4 mg via INTRAVENOUS

## 2022-05-23 MED ORDER — FENTANYL CITRATE PF 50 MCG/ML IJ SOSY
PREFILLED_SYRINGE | INTRAMUSCULAR | Status: AC
  Filled 2022-05-23: qty 1

## 2022-05-23 MED ORDER — CEFAZOLIN SODIUM-DEXTROSE 2-4 GM/100ML-% IV SOLN
2.0000 g | INTRAVENOUS | Status: AC
  Administered 2022-05-23: 2 g via INTRAVENOUS
  Filled 2022-05-23: qty 100

## 2022-05-23 MED ORDER — OXYCODONE HCL 5 MG PO TABS: 5.0000 mg | ORAL_TABLET | Freq: Three times a day (TID) | ORAL | 0 refills | Status: AC | PRN

## 2022-05-23 MED ORDER — SUGAMMADEX SODIUM 500 MG/5ML IV SOLN
INTRAVENOUS | Status: DC | PRN
  Administered 2022-05-23: 200 mg via INTRAVENOUS

## 2022-05-23 MED ORDER — OXYCODONE HCL 5 MG PO TABS: 5.0000 mg | ORAL_TABLET | ORAL | Status: DC | PRN

## 2022-05-23 MED ORDER — ACETAMINOPHEN 500 MG PO TABS
1000.0000 mg | ORAL_TABLET | Freq: Once | ORAL | Status: AC
  Administered 2022-05-23: 1000 mg via ORAL
  Filled 2022-05-23: qty 2

## 2022-05-23 MED ORDER — LIDOCAINE HCL (CARDIAC) PF 100 MG/5ML IV SOSY
PREFILLED_SYRINGE | INTRAVENOUS | Status: DC | PRN
  Administered 2022-05-23: 50 mg via INTRAVENOUS
  Administered 2022-05-23: 60 mg via INTRAVENOUS

## 2022-05-23 MED ORDER — PROPOFOL 10 MG/ML IV BOLUS
INTRAVENOUS | Status: DC | PRN
  Administered 2022-05-23: 110 mg via INTRAVENOUS

## 2022-05-23 MED ORDER — FENTANYL CITRATE (PF) 100 MCG/2ML IJ SOLN
INTRAMUSCULAR | Status: DC | PRN
  Administered 2022-05-23 (×3): 50 ug via INTRAVENOUS

## 2022-05-23 MED ORDER — SODIUM CHLORIDE 0.9% FLUSH: 3.0000 mL | Freq: Two times a day (BID) | INTRAVENOUS | Status: DC

## 2022-05-23 MED ORDER — DEXMEDETOMIDINE HCL IN NACL 80 MCG/20ML IV SOLN
INTRAVENOUS | Status: DC | PRN
  Administered 2022-05-23: 8 ug via BUCCAL

## 2022-05-23 MED ORDER — BUPIVACAINE-EPINEPHRINE 0.25% -1:200000 IJ SOLN
INTRAMUSCULAR | Status: DC | PRN
  Administered 2022-05-23: 30 mL

## 2022-05-23 MED ORDER — ONDANSETRON HCL 4 MG/2ML IJ SOLN
INTRAMUSCULAR | Status: DC | PRN
  Administered 2022-05-23: 4 mg via INTRAVENOUS

## 2022-05-23 MED ORDER — ACETAMINOPHEN 500 MG PO TABS: 1000.0000 mg | ORAL_TABLET | ORAL | Status: DC

## 2022-05-23 MED ORDER — DOCUSATE SODIUM 100 MG PO CAPS: 100.0000 mg | ORAL_CAPSULE | Freq: Two times a day (BID) | ORAL | 0 refills | Status: AC

## 2022-05-23 MED ORDER — AMISULPRIDE (ANTIEMETIC) 5 MG/2ML IV SOLN: 10.0000 mg | Freq: Once | INTRAVENOUS | Status: DC | PRN

## 2022-05-23 MED ORDER — LACTATED RINGERS IV SOLN: INTRAVENOUS | Status: DC

## 2022-05-23 MED ORDER — CHLORHEXIDINE GLUCONATE 0.12 % MT SOLN
15.0000 mL | Freq: Once | OROMUCOSAL | Status: AC
  Administered 2022-05-23: 15 mL via OROMUCOSAL

## 2022-05-23 MED ORDER — BUPIVACAINE LIPOSOME 1.3 % IJ SUSP: 20.0000 mL | Freq: Once | INTRAMUSCULAR | Status: DC

## 2022-05-23 MED ORDER — BUPIVACAINE-EPINEPHRINE (PF) 0.25% -1:200000 IJ SOLN
INTRAMUSCULAR | Status: AC
  Filled 2022-05-23: qty 30

## 2022-05-23 MED ORDER — BUPIVACAINE LIPOSOME 1.3 % IJ SUSP
INTRAMUSCULAR | Status: AC
  Filled 2022-05-23: qty 20

## 2022-05-23 MED ORDER — ORAL CARE MOUTH RINSE: 15.0000 mL | Freq: Once | OROMUCOSAL | Status: AC

## 2022-05-23 MED ORDER — OXYCODONE HCL 5 MG PO TABS
5.0000 mg | ORAL_TABLET | Freq: Once | ORAL | Status: AC | PRN
  Administered 2022-05-23: 5 mg via ORAL

## 2022-05-23 MED ORDER — ACETAMINOPHEN 650 MG RE SUPP: 650.0000 mg | RECTAL | Status: DC | PRN

## 2022-05-23 SURGICAL SUPPLY — 47 items
APL PRP STRL LF DISP 70% ISPRP (MISCELLANEOUS) ×1
APL SKNCLS STERI-STRIP NONHPOA (GAUZE/BANDAGES/DRESSINGS) ×1
BAG COUNTER SPONGE SURGICOUNT (BAG) IMPLANT
BAG SPNG CNTER NS LX DISP (BAG) ×1
BENZOIN TINCTURE PRP APPL 2/3 (GAUZE/BANDAGES/DRESSINGS) ×1 IMPLANT
BLADE SURG 15 STRL LF DISP TIS (BLADE) ×1 IMPLANT
BLADE SURG 15 STRL SS (BLADE) ×1
CHLORAPREP W/TINT 26 (MISCELLANEOUS) ×1 IMPLANT
CLSR STERI-STRIP ANTIMIC 1/2X4 (GAUZE/BANDAGES/DRESSINGS) IMPLANT
COVER SURGICAL LIGHT HANDLE (MISCELLANEOUS) ×1 IMPLANT
DRAIN PENROSE 0.5X18 (DRAIN) ×1 IMPLANT
DRAPE LAPAROSCOPIC ABDOMINAL (DRAPES) ×1 IMPLANT
ELECT REM PT RETURN 15FT ADLT (MISCELLANEOUS) ×1 IMPLANT
GAUZE SPONGE 4X4 12PLY STRL (GAUZE/BANDAGES/DRESSINGS) IMPLANT
GLOVE BIO SURGEON STRL SZ 6 (GLOVE) ×1 IMPLANT
GLOVE INDICATOR 6.5 STRL GRN (GLOVE) ×1 IMPLANT
GLOVE SS BIOGEL STRL SZ 6 (GLOVE) ×1 IMPLANT
GOWN STRL REUS W/ TWL LRG LVL3 (GOWN DISPOSABLE) ×1 IMPLANT
GOWN STRL REUS W/ TWL XL LVL3 (GOWN DISPOSABLE) IMPLANT
GOWN STRL REUS W/TWL LRG LVL3 (GOWN DISPOSABLE) ×1
GOWN STRL REUS W/TWL XL LVL3 (GOWN DISPOSABLE)
KIT BASIN OR (CUSTOM PROCEDURE TRAY) ×1 IMPLANT
KIT TURNOVER KIT A (KITS) IMPLANT
MARKER SKIN DUAL TIP RULER LAB (MISCELLANEOUS) ×1 IMPLANT
MESH ULTRAPRO 3X6 7.6X15CM (Mesh General) IMPLANT
NDL HYPO 22X1.5 SAFETY MO (MISCELLANEOUS) ×1 IMPLANT
NEEDLE HYPO 22X1.5 SAFETY MO (MISCELLANEOUS) ×1 IMPLANT
NEEDLE SAFETY HYPO 22GAX1.5 (MISCELLANEOUS) ×1
PACK BASIC VI WITH GOWN DISP (CUSTOM PROCEDURE TRAY) ×1 IMPLANT
PENCIL SMOKE EVACUATOR (MISCELLANEOUS) IMPLANT
SPIKE FLUID TRANSFER (MISCELLANEOUS) ×1 IMPLANT
SPONGE T-LAP 4X18 ~~LOC~~+RFID (SPONGE) ×1 IMPLANT
STRIP CLOSURE SKIN 1/2X4 (GAUZE/BANDAGES/DRESSINGS) ×1 IMPLANT
SUT ETHIBOND 0 MO6 C/R (SUTURE) ×1 IMPLANT
SUT MNCRL AB 4-0 PS2 18 (SUTURE) ×1 IMPLANT
SUT PDS AB 0 CT1 36 (SUTURE) ×2 IMPLANT
SUT SILK 3 0 (SUTURE) ×1
SUT SILK 3-0 18XBRD TIE 12 (SUTURE) ×1 IMPLANT
SUT VIC AB 3-0 SH 27 (SUTURE) ×2
SUT VIC AB 3-0 SH 27XBRD (SUTURE) ×2 IMPLANT
SUT VICRYL 0 UR6 27IN ABS (SUTURE) IMPLANT
SUT VICRYL 3 0 BR 18  UND (SUTURE) ×1
SUT VICRYL 3 0 BR 18 UND (SUTURE) ×1 IMPLANT
SYR CONTROL 10ML LL (SYRINGE) ×1 IMPLANT
TOWEL OR 17X26 10 PK STRL BLUE (TOWEL DISPOSABLE) ×1 IMPLANT
TOWEL OR NON WOVEN STRL DISP B (DISPOSABLE) ×1 IMPLANT
TRAY FOLEY MTR SLVR 16FR STAT (SET/KITS/TRAYS/PACK) IMPLANT

## 2022-05-23 NOTE — Transfer of Care (Signed)
Immediate Anesthesia Transfer of Care Note  Patient: [name]  Procedure(s) Performed: OPEN BILATERAL INGUINAL HERNIA REPAIRS (Bilateral: Abdomen)  Patient Location: PACU  Anesthesia Type:General  Level of Consciousness: awake, alert , oriented, and patient cooperative  Airway & Oxygen Therapy: Patient Spontanous Breathing and Patient connected to face mask oxygen  Post-op Assessment: Report given to RN, Post -op Vital signs reviewed and stable, and Patient moving all extremities X 4  Post vital signs: Reviewed and stable  Last Vitals:  Vitals Value Taken Time  BP 125/69 05/23/22 1143  Temp    Pulse 73 05/23/22 1144  Resp 22 05/23/22 1144  SpO2 100 % 05/23/22 1144  Vitals shown include unvalidated device data.  Last Pain:  Vitals:   05/23/22 0745  TempSrc:   PainSc: 0-No pain         Complications: No notable events documented.

## 2022-05-23 NOTE — Discharge Instructions (Signed)
HERNIA REPAIR: POST OP INSTRUCTIONS   EAT Gradually transition to a high fiber diet with a fiber supplement over the next few weeks after discharge.  Start with a pureed / full liquid diet (see below)  WALK Walk an hour a day (cumulative- not all at once).  Control your pain to do that.    CONTROL PAIN Control pain so that you can walk, sleep, tolerate sneezing/coughing, and go up/down stairs.  HAVE A BOWEL MOVEMENT DAILY Keep your bowels regular to avoid problems.  OK to try a laxative to override constipation.  OK to use an antidiarrheal to slow down diarrhea.  Call if not better after 2 tries  CALL IF YOU HAVE PROBLEMS/CONCERNS Call if you are still struggling despite following these instructions. Call if you have concerns not answered by these instructions  ######################################################################    DIET: Follow a light bland diet & liquids the first 24 hours after arrival home, such as soup, liquids, starches, etc.  Be sure to drink plenty of fluids.  Quickly advance to a usual solid diet within a few days.  Avoid fast food or heavy meals initially as you are more likely to get nauseated or have irregular bowels.  A low-sugar, high-fiber diet for the rest of your life is ideal.   Take your usually prescribed home medications unless otherwise directed.  PAIN CONTROL: Pain is best controlled by a usual combination of three different methods TOGETHER: Ice/Heat Over the counter pain medication Prescription pain medication Most patients will experience some swelling and bruising around the hernia(s) such as the bellybutton, groins, or old incisions.  Ice packs or heating pads (30-60 minutes up to 6 times a day) will help. Use ice for the first few days to help decrease swelling and bruising, then switch to heat to help relax tight/sore spots and speed recovery.  Some people prefer to use ice alone, heat alone, alternating between ice & heat.  Experiment  to what works for you.  Swelling and bruising can take several weeks to resolve.   It is helpful to take an over-the-counter pain medication regularly for the first days: Naproxen (Aleve, etc)  Two 220mg tabs twice a day OR Ibuprofen (Advil, etc) Three 200mg tabs four times a day (every meal & bedtime) AND Acetaminophen (Tylenol, etc) 325-650mg four times a day (every meal & bedtime) A  prescription for pain medication should be given to you upon discharge.  Take your pain medication as prescribed, IF NEEDED.  If you are having problems/concerns with the prescription medicine (does not control pain, nausea, vomiting, rash, itching, etc), please call us [phone] to see if we need to switch you to a different pain medicine that will work better for you and/or control your side effect better. If you need a refill on your pain medication, please contact your pharmacy.  They will contact our office to request authorization. Prescriptions will not be filled after 5 pm or on week-ends.  Avoid getting constipated.  Between the surgery and the pain medications, it is common to experience some constipation.  Increasing fluid intake and taking a fiber supplement (such as Metamucil, Citrucel, FiberCon, MiraLax, etc) 1-2 times a day regularly will usually help prevent this problem from occurring.  A mild laxative (prune juice, Milk of Magnesia, MiraLax, etc) should be taken according to package directions if there are no bowel movements after 48 hours.    Wash / shower every day, starting 2 days after surgery.  You may shower over   the steri strips or skin glue which are waterproof.  No rubbing, scrubbing, lotions or ointments to incision(s). Do not soak or submerge.   Remove your outer bandage 2 days after surgery. Steri strips will peel off after 1-2 weeks. You may leave the incision open to air.  You may replace a dressing/Band-Aid to cover an incision for comfort if you wish.  Continue to shower over  incision(s) after the dressing is off.  ACTIVITIES as tolerated:   You may resume regular (light) daily activities beginning the next day--such as daily self-care, walking, climbing stairs--gradually increasing activities as tolerated.  Control your pain so that you can walk an hour a day.  If you can walk 30 minutes without difficulty, it is safe to try more intense activity such as jogging, treadmill, bicycling, low-impact aerobics, swimming, etc. Refrain from the most intensive and strenuous activity such as sit-ups, heavy lifting, contact sports, etc  Refrain from any heavy lifting or straining until 6 weeks after surgery.   Wearing a scrotal support or compression undergarment when you are up and moving around will help with discomfort and swelling DO NOT PUSH THROUGH PAIN.  Let pain be your guide: If it hurts to do something, don't do it.  Pain is your body warning you to avoid that activity for another week until the pain goes down. You may drive when you are no longer taking prescription pain medication, you can comfortably wear a seatbelt, and you can safely maneuver your car and apply brakes. You may have sexual intercourse when it is comfortable.   FOLLOW UP in our office Please call [facility] at [phone] to set up an appointment to see your surgeon in the office for a follow-up appointment approximately 2-3 weeks after your surgery. Make sure that you call for this appointment the day you arrive home to insure a convenient appointment time.  9.  If you have disability of FMLA / Family leave forms, please bring the forms to the office for processing.  (do not give to your surgeon).  WHEN TO CALL us [phone]: Poor pain control Reactions / problems with new medications (rash/itching, nausea, etc)  Fever over 101.5 F (38.5 C) Inability to urinate Nausea and/or vomiting Worsening swelling or bruising Continued bleeding from incision. Increased pain, redness, or drainage from the  incision   The clinic staff is available to answer your questions during regular business hours (8:30am-5pm).  Please don't hesitate to call and ask to speak to one of our nurses for clinical concerns.   If you have a medical emergency, go to the nearest emergency room or call 911.  A surgeon from [facility] is always on call at the hospitals in [facility], [place], [place], [place], [place]  [address] ?  [address] [address], [place], [place]   [address] MAIN: [phone] ? TOLL FREE: [phone] ? FAX: [phone] [email or web address]

## 2022-05-23 NOTE — Anesthesia Procedure Notes (Signed)
Procedure Name: Intubation Date/Time: 05/23/2022 9:29 AM  Performed by: Jonna Munro, CRNAPre-anesthesia Checklist: Patient identified, Emergency Drugs available, Suction available, Patient being monitored and Timeout performed Patient Re-evaluated:Patient Re-evaluated prior to induction Oxygen Delivery Method: Circle system utilized Preoxygenation: Pre-oxygenation with 100% oxygen Induction Type: IV induction Ventilation: Mask ventilation without difficulty Laryngoscope Size: Mac and 3 Grade View: Grade I Tube type: Oral Tube size: 7.5 mm Number of attempts: 1 Airway Equipment and Method: Stylet Placement Confirmation: ETT inserted through vocal cords under direct vision, positive ETCO2, CO2 detector and breath sounds checked- equal and bilateral Secured at: 22 cm Tube secured with: Tape Dental Injury: Teeth and Oropharynx as per pre-operative assessment

## 2022-05-23 NOTE — Op Note (Signed)
Operative Note  Alphonsus Brandell  HE:[id number]  [id number]:[id number]  05/23/2022   Surgeon: Clovis Riley MD FACS   Procedure performed: Open bilateral inguinal hernia repair with mesh   Preop diagnosis:  bilateral inguinal hernia   Post-op diagnosis/intraop findings:  bilateral large direct hernias with complete disruption of the inguinal floor   Specimens: none   EBL: [id number]   Complications: none   Description of procedure: After confirming informed consent, the patient was taken to the operating room and placed supine on operating room table where general anesthesia was initiated, preoperative antibiotics were administered, SCDs applied, and a formal timeout was performed. The groins and lower abdomen were clipped, prepped and draped in the usual sterile fashion.  Beginning on the right side, an oblique incision was made the just above the inguinal ligament after infiltrating the tissues with local anesthetic (exparel mixed with 0.25% marcaine with epinephrine). Soft tissues were dissected using electrocautery until the external oblique aponeurosis was encountered. This was divided sharply to expand the external ring. A plane was bluntly developed between the spermatic cord and the external oblique. The spermatic cord was then bluntly dissected away from the pubic tubercle and encircled with a Penrose. Inspection of the inguinal anatomy revealed a large direct hernia sac with complete disruption of the inguinal floor. The spermatic cord was dissected to the level of the internal ring, and confirmed there was no indirect hernia sac.  The direct hernia sac was dissected and reduced, intact, into the abdominal cavity.  The inguinal floor was reconstructed suturing the inguinal ligament to the conjoint tendon with interrupted 0 PDS, leaving an internal ring just sufficient for the cord structures. A 3 x 6 piece of ultra Pro mesh was brought onto the field and trimmed to approximate the field. This was  sutured to the pubic tubercle fascia, inferior shelving edge and to the internal oblique superiorly with interrupted 0 ethibonds. The tails of the mesh were wrapped around the spermatic cord, ensuring adequate room for the cord, and sutured to each other with 0 ethibond, and then directed laterally to lie flat beneath the external oblique aponeurosis. Hemostasis was ensured within the wound. The Penrose was removed. The external oblique aponeurosis was reapproximated with a running 3-0 Vicryl to re-create a narrowed external ring. More local was infiltrated around the pubic tubercle and in the plane just below the external oblique. The Scarpa's was reapproximated with interrupted 3-0 Vicryls. The skin was closed with a running subcuticular 4-0 Monocryl.   We then proceed to the left side where in an identical fashion, an oblique incision was made the just above the inguinal ligament after infiltrating the tissues with local anesthetic (exparel mixed with 0.25% marcaine with epinephrine). Soft tissues were dissected using electrocautery until the external oblique aponeurosis was encountered. This was divided sharply to expand the external ring. The external ring was somewhat adherent to the hernia sac and spermatic cord and this was carefully freed. A plane was bluntly developed between the spermatic cord and the external oblique. The spermatic cord was then bluntly dissected away from the pubic tubercle and encircled with a Penrose. Inspection of the inguinal anatomy also on this side revealed a large direct hernia sac with complete disruption of the inguinal floor. The hernia sac was fairly adherent to the spermatic cord and again this was carefully dissected free, carefully protecting the cord structures. The spermatic cord was dissected to the level of the internal ring, and confirmed there was no indirect  hernia sac.  The direct hernia sac was dissected and reduced, intact, into the abdominal cavity.  The  inguinal floor was reconstructed suturing the inguinal ligament to the conjoint tendon with interrupted 0 PDS, leaving an internal ring just sufficient for the cord structures. A 3 x 6 piece of ultra Pro mesh was brought onto the field and trimmed to approximate the field. This was sutured to the pubic tubercle fascia, inferior shelving edge and to the internal oblique superiorly with interrupted 0 ethibonds. The tails of the mesh were wrapped around the spermatic cord, ensuring adequate room for the cord, and sutured to each other with 0 ethibond, and then directed laterally to lie flat beneath the external oblique aponeurosis. Hemostasis was ensured within the wound. The Penrose was removed. The external oblique aponeurosis was reapproximated with a running 3-0 Vicryl to re-create a narrowed external ring. More local was infiltrated around the pubic tubercle and in the plane just below the external oblique. The Scarpa's was reapproximated with interrupted 3-0 Vicryls. The skin was closed with a running subcuticular 4-0 Monocryl. The field was then cleaned, benzoin and Steri-Strips and sterile bandages were applied. A scrotal support garment was then placed. The patient was then awakened extubated and taken to PACU in stable condition.    All counts were correct at the completion of the case.

## 2022-05-23 NOTE — Anesthesia Postprocedure Evaluation (Signed)
Anesthesia Post Note  Patient: [name]  Procedure(s) Performed: OPEN BILATERAL INGUINAL HERNIA REPAIRS (Bilateral: Abdomen)     Patient location during evaluation: PACU Anesthesia Type: General Level of consciousness: awake and alert Pain management: pain level controlled Vital Signs Assessment: post-procedure vital signs reviewed and stable Respiratory status: spontaneous breathing, nonlabored ventilation, respiratory function stable and patient connected to nasal cannula oxygen Cardiovascular status: blood pressure returned to baseline and stable Postop Assessment: no apparent nausea or vomiting Anesthetic complications: no  No notable events documented.  Last Vitals:  Vitals:   05/23/22 1245 05/23/22 1324  BP: 124/67 120/70  Pulse: 78 76  Resp: 11 12  Temp:  36.7 C  SpO2: 95% 95%    Last Pain:  Vitals:   05/23/22 1324  TempSrc: Oral  PainSc: 0-No pain                 Effie Berkshire

## 2022-05-23 NOTE — Interval H&P Note (Signed)
History and Physical Interval Note:  05/23/2022 8:05 AM  [name]  has presented today for surgery, with the diagnosis of HERNIAS.  The various methods of treatment have been discussed with the patient and family. After consideration of risks, benefits and other options for treatment, the patient has consented to  Procedure(s): OPEN BILATERAL INGUINAL HERNIA REPAIRS (Bilateral) as a surgical intervention.  The patient's history has been reviewed, patient examined, no change in status, stable for surgery.  I have reviewed the patient's chart and labs.  Questions were answered to the patient's satisfaction.     Gwendy Boeder Rich Brave

## 2022-05-25 ENCOUNTER — Encounter (HOSPITAL_COMMUNITY): Payer: Self-pay | Admitting: Surgery

## 2022-07-21 ENCOUNTER — Other Ambulatory Visit: Payer: Self-pay

## 2022-07-21 DIAGNOSIS — I700 Atherosclerosis of aorta: Secondary | ICD-10-CM

## 2022-07-21 DIAGNOSIS — Z21 Asymptomatic human immunodeficiency virus [HIV] infection status: Secondary | ICD-10-CM

## 2022-07-21 DIAGNOSIS — E785 Hyperlipidemia, unspecified: Secondary | ICD-10-CM

## 2022-07-21 DIAGNOSIS — I25119 Atherosclerotic heart disease of native coronary artery with unspecified angina pectoris: Secondary | ICD-10-CM

## 2022-07-21 DIAGNOSIS — N1831 Chronic kidney disease, stage 3a: Secondary | ICD-10-CM

## 2022-07-21 DIAGNOSIS — I10 Essential (primary) hypertension: Secondary | ICD-10-CM

## 2022-09-01 ENCOUNTER — Other Ambulatory Visit: Payer: Self-pay | Admitting: Family Medicine

## 2022-09-01 DIAGNOSIS — N521 Erectile dysfunction due to diseases classified elsewhere: Secondary | ICD-10-CM

## 2022-10-02 DIAGNOSIS — M48062 Spinal stenosis, lumbar region with neurogenic claudication: Secondary | ICD-10-CM | POA: Diagnosis not present

## 2022-10-07 ENCOUNTER — Ambulatory Visit: Payer: Medicare PPO | Admitting: Family Medicine

## 2022-10-07 ENCOUNTER — Encounter: Payer: Self-pay | Admitting: Family Medicine

## 2022-10-07 VITALS — BP 158/74 | HR 65 | Temp 97.4°F | Resp 16 | Ht 67.0 in | Wt 126.0 lb

## 2022-10-07 DIAGNOSIS — I77811 Abdominal aortic ectasia: Secondary | ICD-10-CM

## 2022-10-07 DIAGNOSIS — I2510 Atherosclerotic heart disease of native coronary artery without angina pectoris: Secondary | ICD-10-CM | POA: Diagnosis not present

## 2022-10-07 DIAGNOSIS — N522 Drug-induced erectile dysfunction: Secondary | ICD-10-CM

## 2022-10-07 DIAGNOSIS — I10 Essential (primary) hypertension: Secondary | ICD-10-CM

## 2022-10-07 DIAGNOSIS — J301 Allergic rhinitis due to pollen: Secondary | ICD-10-CM | POA: Diagnosis not present

## 2022-10-07 DIAGNOSIS — Z9889 Other specified postprocedural states: Secondary | ICD-10-CM

## 2022-10-07 DIAGNOSIS — Z209 Contact with and (suspected) exposure to unspecified communicable disease: Secondary | ICD-10-CM | POA: Diagnosis not present

## 2022-10-07 DIAGNOSIS — Z79899 Other long term (current) drug therapy: Secondary | ICD-10-CM

## 2022-10-07 DIAGNOSIS — E782 Mixed hyperlipidemia: Secondary | ICD-10-CM | POA: Diagnosis not present

## 2022-10-07 DIAGNOSIS — Z21 Asymptomatic human immunodeficiency virus [HIV] infection status: Secondary | ICD-10-CM

## 2022-10-07 DIAGNOSIS — M47816 Spondylosis without myelopathy or radiculopathy, lumbar region: Secondary | ICD-10-CM

## 2022-10-07 DIAGNOSIS — N1831 Chronic kidney disease, stage 3a: Secondary | ICD-10-CM

## 2022-10-07 DIAGNOSIS — I700 Atherosclerosis of aorta: Secondary | ICD-10-CM

## 2022-10-07 DIAGNOSIS — L4050 Arthropathic psoriasis, unspecified: Secondary | ICD-10-CM | POA: Diagnosis not present

## 2022-10-07 DIAGNOSIS — M48062 Spinal stenosis, lumbar region with neurogenic claudication: Secondary | ICD-10-CM

## 2022-10-07 DIAGNOSIS — Z8719 Personal history of other diseases of the digestive system: Secondary | ICD-10-CM

## 2022-10-07 LAB — T-HELPER CELLS (CD4) COUNT (NOT AT ARMC)
Basophils Absolute: 0 10*3/uL (ref 0.0–0.2)
Basos: 0 %
EOS (ABSOLUTE): 0 10*3/uL (ref 0.0–0.4)
Eos: 1 %
Hematocrit: 38.4 % (ref 37.5–51.0)
Hemoglobin: 13.1 g/dL (ref 13.0–17.7)
Immature Grans (Abs): 0 10*3/uL (ref 0.0–0.1)
Immature Granulocytes: 0 %
Lymphocytes Absolute: 0.6 10*3/uL — ABNORMAL LOW (ref 0.7–3.1)
Lymphs: 17 %
MCH: 34.6 pg — ABNORMAL HIGH (ref 26.6–33.0)
MCHC: 34.1 g/dL (ref 31.5–35.7)
MCV: 101 fL — ABNORMAL HIGH (ref 79–97)
Monocytes Absolute: 0.3 10*3/uL (ref 0.1–0.9)
Monocytes: 7 %
Neutrophils Absolute: 2.9 10*3/uL (ref 1.4–7.0)
Neutrophils: 75 %
Platelets: 103 10*3/uL — ABNORMAL LOW (ref 150–450)
RBC: 3.79 x10E6/uL — ABNORMAL LOW (ref 4.14–5.80)
RDW: 12.2 % (ref 11.6–15.4)
WBC: 3.8 10*3/uL (ref 3.4–10.8)

## 2022-10-07 LAB — HIV-1 RNA QUANT-NO REFLEX-BLD

## 2022-10-07 LAB — RPR

## 2022-10-07 NOTE — Progress Notes (Signed)
   Subjective:    Patient ID: [name], male    DOB: [birth year], 77 y.o.   MRN: [id number]  HPI He is here for an interval evaluation.  He has had recent hernia surgery and is doing well with that.  He continues have difficulty with low back pain and is now taking codeine to help with the pain.  He is scheduled for follow-up MRI in the near future.  He follows up regularly with cardiology and has had no tachycardia, no chest pain, shortness of breath.  He continues on his statin and is having no difficulty with that.  Does occasionally use Cialis but would like to be tested for STDs to be safe.  He continues on Celebrex to help with his psoriatic arthritis.  Continues on his blood pressure medications.  He is also taking Dovato and having no trouble with that.   Review of Systems     Objective:    Physical Exam Alert and in no distress. Tympanic membranes and canals are normal. Pharyngeal area is normal. Neck is supple without adenopathy or thyromegaly. Cardiac exam shows a regular sinus rhythm without murmurs or gallops. Lungs are clear to auscultation. Blood work was reviewed.       Assessment & Plan:   Problem List Items Addressed This Visit     Allergic rhinitis due to pollen - Primary (Chronic)   ASHD (arteriosclerotic heart disease) (Chronic)   HIV positive (HCC) (Chronic)   Relevant Orders   HIV-1 RNA quant-no reflex-bld   T-helper cells (CD4) count (not at [facility])   Hyperlipidemia (Chronic)   Psoriatic arthritis (HCC) (Chronic)   Relevant Medications   PERCOCET 5-325 MG tablet   Atherosclerosis of aorta (HCC)   Drug-induced erectile dysfunction   Ectatic abdominal aorta (HCC)   Encounter for long-term (current) use of high-risk medication   History of lumbar laminectomy   HTN (hypertension)   Spinal stenosis of lumbar region with neurogenic claudication   Relevant Medications   PERCOCET 5-325 MG tablet   Spondylosis without myelopathy or radiculopathy,  lumbar region   Relevant Medications   PERCOCET 5-325 MG tablet   Stage 3a chronic kidney disease (HCC)   Other Visit Diagnoses     S/P hernia repair       Contact with or exposure to communicable disease       Relevant Orders   RPR   GC/Chlamydia Probe Amp     Discussed use of Percocet and driving.  Also recommend that he get Tdap and RSV.  I will do STD testing to be safe.  He will continue on his present medication regimen and will call when he runs out.  Also discussed his elevated blood pressure and we will reevaluate this on an ongoing basis.  Over 45 minutes spent discussing all these issues with him.

## 2022-10-14 DIAGNOSIS — M47816 Spondylosis without myelopathy or radiculopathy, lumbar region: Secondary | ICD-10-CM | POA: Diagnosis not present

## 2022-10-14 DIAGNOSIS — M48062 Spinal stenosis, lumbar region with neurogenic claudication: Secondary | ICD-10-CM | POA: Diagnosis not present

## 2022-10-14 DIAGNOSIS — M5136 Other intervertebral disc degeneration, lumbar region: Secondary | ICD-10-CM | POA: Diagnosis not present

## 2022-10-14 DIAGNOSIS — M48061 Spinal stenosis, lumbar region without neurogenic claudication: Secondary | ICD-10-CM | POA: Diagnosis not present

## 2022-10-14 DIAGNOSIS — M5126 Other intervertebral disc displacement, lumbar region: Secondary | ICD-10-CM | POA: Diagnosis not present

## 2022-11-04 DIAGNOSIS — M48062 Spinal stenosis, lumbar region with neurogenic claudication: Secondary | ICD-10-CM | POA: Diagnosis not present

## 2022-11-05 ENCOUNTER — Other Ambulatory Visit: Payer: Self-pay | Admitting: Neurological Surgery

## 2022-11-06 ENCOUNTER — Telehealth: Payer: Self-pay

## 2022-11-06 NOTE — Telephone Encounter (Signed)
   Name: [name]  DOB: 1946/01/31  MRN: [id number]  Primary Cardiologist: Kristeen Miss, MD  Chart reviewed as part of pre-operative protocol coverage. Because of [name]'s past medical history and time since last visit, he will require a follow-up in-office visit in order to better assess preoperative cardiovascular risk. It has been > 1 year since his last OV.   Pre-op covering staff: - Please schedule appointment and call patient to inform them. If patient already had an upcoming appointment within acceptable timeframe, please add "pre-op clearance" to the appointment notes so provider is aware. - Please contact requesting surgeon's office via preferred method (i.e, phone, fax) to inform them of need for appointment prior to surgery.   Joylene Grapes, NP  11/06/2022, 10:59 AM

## 2022-11-06 NOTE — Telephone Encounter (Signed)
...     Pre-operative Risk Assessment    Patient Name: [name]  DOB: 1946/02/06 MRN: [id number]      Request for Surgical Clearance    Procedure:   L3-4 LUMBAR FUSION  Date of Surgery:  Clearance 11/21/22                                 Surgeon:  Marikay Alar Surgeon's Group or Practice Name:  [facility] Phone number:  [phone] Fax number:  [phone]   Type of Clearance Requested:   - Medical    Type of Anesthesia:  General    Additional requests/questions:   LAST O/V 10/08/21  Signed, Renee Ramus   11/06/2022, 9:12 AM

## 2022-11-06 NOTE — Telephone Encounter (Signed)
Spoke with patient who is agreeable to see Robin Searing, NP on 9/9 at 8:50 am.

## 2022-11-07 DIAGNOSIS — D485 Neoplasm of uncertain behavior of skin: Secondary | ICD-10-CM | POA: Diagnosis not present

## 2022-11-07 DIAGNOSIS — L578 Other skin changes due to chronic exposure to nonionizing radiation: Secondary | ICD-10-CM | POA: Diagnosis not present

## 2022-11-07 DIAGNOSIS — L409 Psoriasis, unspecified: Secondary | ICD-10-CM | POA: Diagnosis not present

## 2022-11-07 DIAGNOSIS — D0439 Carcinoma in situ of skin of other parts of face: Secondary | ICD-10-CM | POA: Diagnosis not present

## 2022-11-07 DIAGNOSIS — L821 Other seborrheic keratosis: Secondary | ICD-10-CM | POA: Diagnosis not present

## 2022-11-07 DIAGNOSIS — L814 Other melanin hyperpigmentation: Secondary | ICD-10-CM | POA: Diagnosis not present

## 2022-11-07 DIAGNOSIS — D225 Melanocytic nevi of trunk: Secondary | ICD-10-CM | POA: Diagnosis not present

## 2022-11-07 DIAGNOSIS — Z85828 Personal history of other malignant neoplasm of skin: Secondary | ICD-10-CM | POA: Diagnosis not present

## 2022-11-07 DIAGNOSIS — L570 Actinic keratosis: Secondary | ICD-10-CM | POA: Diagnosis not present

## 2022-11-12 DIAGNOSIS — M48062 Spinal stenosis, lumbar region with neurogenic claudication: Secondary | ICD-10-CM | POA: Diagnosis not present

## 2022-11-13 NOTE — Pre-Procedure Instructions (Addendum)
[name]   Your procedure is scheduled on Friday, September 13 th.  Report to [facility] at 11:30 PM  Call this number if you have problems the morning of surgery:  [phone] this is the Pre- op Desk  If you experience any cold or flu symptoms such as cough, fever, chills, shortness of breath, etc. between now and your scheduled surgery, please notify us at the above number.  If you have questions Monday - Thursday , call [phone], this is the PAT desk, ask for any nurse.    Remember:  Do not eat  after midnight.  You may drink clear liquids until 10:30 AM.  Clear liquids allowed are:      Water, Juice (No red color; non-citric and without pulp; diabetics please choose diet or no sugar options), Carbonated beverages (diabetics please choose diet or no sugar options), Clear Tea (No creamer, milk, or cream, including half & half and powdered creamer), Black Coffee Only (No creamer, milk or cream, including half & half and powdered creamer), Plain Jell-O Only (No red color; diabetics please choose no sugar options), Clear Sports drink (No red color; diabetics please choose diet or no sugar options), and Plain Popsicles Only (No red color; diabetics please choose no sugar options)    Take these medicines the morning of surgery with A SIP OF WATER: dolutegravir-lamiVUDine (DOVATO)  ezetimibe (ZETIA) rosuvastatin (CRESTOR)   Take if needed: acetaminophen (TYLENOL)  nitroGLYCERIN (NITROSTAT) then call EMS PERCOCET  valACYclovir (VALTREX)   1 Week prior to surgery STOP taking Aspirin, Aspirin Products (Goody Powder, Excedrin Migraine), Ibuprofen (Advil), Naproxen (Aleve), Vitamins and Herbal Products (ie Fish Oil).    Pre-operative 5 CHG Bath Instructions   You can play a key role in reducing the risk of infection after surgery. Your skin needs to be as free of germs as possible. You can reduce the number of germs on your skin by washing with  CHG (chlorhexidine gluconate) soap before surgery. CHG is an antiseptic soap that kills germs and continues to kill germs even after washing.   DO NOT use if you have an allergy to chlorhexidine/CHG or antibacterial soaps. If your skin becomes reddened or irritated, stop using the CHG and notify one of our RNs at [phone].   Please shower with the CHG soap starting 4 days before surgery using the following schedule:     Please keep in mind the following:  DO NOT shave, including legs and underarms, starting the day of your first shower.   You may shave your face at any point before/day of surgery.  Place clean sheets on your bed the day you start using CHG soap. Use a clean washcloth (not used since being washed) for each shower. DO NOT sleep with pets once you start using the CHG.   CHG Shower Instructions:  If you choose to wash your hair and private area, wash first with your normal shampoo/soap.  After you use shampoo/soap, rinse your hair and body thoroughly to remove shampoo/soap residue.  Turn the water OFF and apply about 3 tablespoons (45 ml) of CHG soap to a CLEAN washcloth.  Apply CHG soap ONLY FROM YOUR NECK DOWN TO YOUR TOES (washing for 3-5 minutes)  DO NOT use CHG soap on face, private areas, open wounds, or sores.  Pay special attention to the area where your surgery is being performed.  If you are having back surgery, having someone wash your back for you  may be helpful. Wait 2 minutes after CHG soap is applied, then you may rinse off the CHG soap.  Pat dry with a clean towel  Put on clean clothes/pajamas   If you choose to wear lotion, please use ONLY the CHG-compatible lotions on the back of this paper.     Additional instructions for the day of surgery: DO NOT APPLY any lotions, deodorants, cologne, or perfumes.   Put on clean/comfortable clothes.  Brush your teeth.  Ask your nurse before applying any prescription medications to the skin.      CHG  Compatible Lotions   Aveeno Moisturizing lotion  Cetaphil Moisturizing Cream  Cetaphil Moisturizing Lotion  Clairol Herbal Essence Moisturizing Lotion, Dry Skin  Clairol Herbal Essence Moisturizing Lotion, Extra Dry Skin  Clairol Herbal Essence Moisturizing Lotion, Normal Skin  Curel Age Defying Therapeutic Moisturizing Lotion with Alpha Hydroxy  Curel Extreme Care Body Lotion  Curel Soothing Hands Moisturizing Hand Lotion  Curel Therapeutic Moisturizing Cream, Fragrance-Free  Curel Therapeutic Moisturizing Lotion, Fragrance-Free  Curel Therapeutic Moisturizing Lotion, Original Formula  Eucerin Daily Replenishing Lotion  Eucerin Dry Skin Therapy Plus Alpha Hydroxy Crme  Eucerin Dry Skin Therapy Plus Alpha Hydroxy Lotion  Eucerin Original Crme  Eucerin Original Lotion  Eucerin Plus Crme Eucerin Plus Lotion  Eucerin TriLipid Replenishing Lotion  Keri Anti-Bacterial Hand Lotion  Keri Deep Conditioning Original Lotion Dry Skin Formula Softly Scented  Keri Deep Conditioning Original Lotion, Fragrance Free Sensitive Skin Formula  Keri Lotion Fast Absorbing Fragrance Free Sensitive Skin Formula  Keri Lotion Fast Absorbing Softly Scented Dry Skin Formula  Keri Original Lotion  Keri Skin Renewal Lotion Keri Silky Smooth Lotion  Keri Silky Smooth Sensitive Skin Lotion  Nivea Body Creamy Conditioning Oil  Nivea Body Extra Enriched Lotion  Nivea Body Original Lotion  Nivea Body Sheer Moisturizing Lotion Nivea Crme  Nivea Skin Firming Lotion  NutraDerm 30 Skin Lotion  NutraDerm Skin Lotion  NutraDerm Therapeutic Skin Cream  NutraDerm Therapeutic Skin Lotion  ProShield Protective Hand Cream  Provon moisturizing lotion    Do not wear jewelry, make-up or nail polish, including gel polish,  artificial nails, or any other type of covering on natural nails (fingers and  toes).  Do not wear lotions, powders, or perfumes, or deodorant.  Men may shave face and neck.  Do not bring  valuables to the hospital.  [facility] is not responsible for any belongings or valuables.  Contacts, dentures or bridgework may not be worn into surgery.  Leave your suitcase in the car.  After surgery it may be brought to your room.  For patients admitted to the hospital, discharge time will be determined by your treatment team.  DO NOT smoke with in 24 hours prior to surgery.

## 2022-11-13 NOTE — Progress Notes (Signed)
PCP - Dr. Sharlot Gowda  Cardiologist - Dr. Shaune Pascal  EP-  Endocrine-  Pulm-  Chest x-ray -   EKG -   Stress Test -   ECHO -   Cardiac Cath -   AICD- PM- LOOP-  Nerve Stimulator-  Dialysis-  Sleep Study -  CPAP -   LABS-  ASA-  ERAS-  HA1C-NA GLP-1-no Fasting Blood Sugar - NA Checks Blood Sugar ____0_ times a day  Anesthesia-  Pt denies having chest pain, sob, or fever at this time. All instructions explained to the pt, with a verbal understanding of the material. Pt agrees to go over the instructions while at home for a better understanding. The opportunity to ask questions was provided.

## 2022-11-14 ENCOUNTER — Encounter (HOSPITAL_COMMUNITY): Payer: Self-pay

## 2022-11-14 ENCOUNTER — Encounter (HOSPITAL_COMMUNITY)
Admission: RE | Admit: 2022-11-14 | Discharge: 2022-11-14 | Disposition: A | Discharge status: Home / Self Care | Payer: Medicare PPO | Source: Ambulatory Visit | Attending: Neurological Surgery | Admitting: Neurological Surgery

## 2022-11-14 ENCOUNTER — Other Ambulatory Visit: Payer: Self-pay

## 2022-11-14 VITALS — BP 153/59 | HR 68 | Temp 97.8°F | Resp 18 | Ht 67.0 in | Wt 121.7 lb

## 2022-11-14 DIAGNOSIS — D696 Thrombocytopenia, unspecified: Secondary | ICD-10-CM | POA: Insufficient documentation

## 2022-11-14 DIAGNOSIS — N183 Chronic kidney disease, stage 3 unspecified: Secondary | ICD-10-CM | POA: Diagnosis not present

## 2022-11-14 DIAGNOSIS — E785 Hyperlipidemia, unspecified: Secondary | ICD-10-CM | POA: Diagnosis not present

## 2022-11-14 DIAGNOSIS — Z01818 Encounter for other preprocedural examination: Secondary | ICD-10-CM | POA: Insufficient documentation

## 2022-11-14 DIAGNOSIS — L4050 Arthropathic psoriasis, unspecified: Secondary | ICD-10-CM | POA: Diagnosis not present

## 2022-11-14 DIAGNOSIS — R001 Bradycardia, unspecified: Secondary | ICD-10-CM | POA: Insufficient documentation

## 2022-11-14 DIAGNOSIS — Z951 Presence of aortocoronary bypass graft: Secondary | ICD-10-CM | POA: Diagnosis not present

## 2022-11-14 DIAGNOSIS — Z21 Asymptomatic human immunodeficiency virus [HIV] infection status: Secondary | ICD-10-CM | POA: Insufficient documentation

## 2022-11-14 DIAGNOSIS — I2510 Atherosclerotic heart disease of native coronary artery without angina pectoris: Secondary | ICD-10-CM | POA: Insufficient documentation

## 2022-11-14 DIAGNOSIS — D631 Anemia in chronic kidney disease: Secondary | ICD-10-CM | POA: Insufficient documentation

## 2022-11-14 DIAGNOSIS — I4510 Unspecified right bundle-branch block: Secondary | ICD-10-CM | POA: Insufficient documentation

## 2022-11-14 DIAGNOSIS — I351 Nonrheumatic aortic (valve) insufficiency: Secondary | ICD-10-CM | POA: Insufficient documentation

## 2022-11-14 DIAGNOSIS — I129 Hypertensive chronic kidney disease with stage 1 through stage 4 chronic kidney disease, or unspecified chronic kidney disease: Secondary | ICD-10-CM | POA: Insufficient documentation

## 2022-11-14 HISTORY — DX: Chronic kidney disease, unspecified: N18.9

## 2022-11-14 HISTORY — DX: Gastro-esophageal reflux disease without esophagitis: K21.9

## 2022-11-14 LAB — TYPE AND SCREEN
ABO/RH(D): A NEG
Antibody Screen: NEGATIVE

## 2022-11-14 LAB — BASIC METABOLIC PANEL
Anion gap: 8 (ref 5–15)
BUN: 23 mg/dL (ref 8–23)
CO2: 24 mmol/L (ref 22–32)
Calcium: 9.2 mg/dL (ref 8.9–10.3)
Chloride: 105 mmol/L (ref 98–111)
Creatinine, Ser: 1.81 mg/dL — ABNORMAL HIGH (ref 0.61–1.24)
GFR, Estimated: 38 mL/min — ABNORMAL LOW (ref >60)
Glucose, Bld: 97 mg/dL (ref 70–99)
Potassium: 4.3 mmol/L (ref 3.5–5.1)
Sodium: 137 mmol/L (ref 135–145)

## 2022-11-14 LAB — SURGICAL PCR SCREEN
MRSA, PCR: NEGATIVE
Staphylococcus aureus: NEGATIVE

## 2022-11-14 LAB — CBC
HCT: 36.2 % — ABNORMAL LOW (ref 39.0–52.0)
Hemoglobin: 12.1 g/dL — ABNORMAL LOW (ref 13.0–17.0)
MCH: 33.8 pg (ref 26.0–34.0)
MCHC: 33.4 g/dL (ref 30.0–36.0)
MCV: 101.1 fL — ABNORMAL HIGH (ref 80.0–100.0)
Platelets: 138 10*3/uL — ABNORMAL LOW (ref 150–400)
RBC: 3.58 MIL/uL — ABNORMAL LOW (ref 4.22–5.81)
RDW: 11.7 % (ref 11.5–15.5)
WBC: 5.2 10*3/uL (ref 4.0–10.5)
nRBC: 0 % (ref 0.0–0.2)

## 2022-11-14 LAB — PROTIME-INR
INR: 1.1 (ref 0.8–1.2)
Prothrombin Time: 14.4 s (ref 11.4–15.2)

## 2022-11-14 NOTE — Progress Notes (Signed)
PCP - Dr. Sharlot Gowda  Cardiologist - Dr. Melburn Popper- patient has an appointment on Monday 11/17/22 for clearance.  EP-no  Endocrine-no  Pulm-no  Chest x-ray - na  EKG - 11/14/22  Stress Test -   ECHO - 10/21/21  Cardiac Cath -   AICD-no PM-no LOOP-no  Nerve Stimulator-no  Dialysis-no  Sleep Study - no CPAP - no  LABS-CBC, BMP, T/S, PT/INR, PCR.  ASA-no  ERAS-yes  HA1C-no GLP-1-no Fasting Blood Sugar - no Checks Blood Sugar _____ times a day  Anesthesia-  Pt denies having chest pain, sob, or fever at this time. All instructions explained to the pt, with a verbal understanding of the material. Pt agrees to go over the instructions while at home for a better understanding. The opportunity to ask questions was provided.

## 2022-11-16 NOTE — Progress Notes (Unsigned)
Cardiology Office Note    Patient Name: [name] Date of Encounter: 11/16/2022  Primary Care Provider:  Ronnald Nian, MD Primary Cardiologist:  Kristeen Miss, MD Primary Electrophysiologist: None   Past Medical History    Past Medical History:  Diagnosis Date   Allergy    RHINITIS   Arthritis    ASHD (arteriosclerotic heart disease)    Cancer (HCC)    skin, currently has one of left side of forehead   Chronic kidney disease    stage 3   Former smoker QUIT 08/09/2010   GERD (gastroesophageal reflux disease)    Herpes labialis    HH (hiatus hernia)    History of kidney stones    6, passed 5 1 was removed by Lithrotrispy   HIV infection (HCC)    POSITIVE   Hypertension    Hypertriglyceridemia    Psoriasis    ARTHRITIS   Tinnitus    CHRONIC    History of Present Illness  [name] is a 77 y.o. male with a PMH of CAD s/p CABG times 06/1998 and, HLD, HTN, CKD stage III, former tobacco abuse, psoriatic arthritis who presents today for preoperative clearance.  [name] was seen initially in 2022 for preoperative clearance by Dr. Elease Hashimoto.  He has a PMH as noted above of CABG in 2000 and was a previous patient of Dr. Truett Perna.  He was last seen by Dr. 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  He underwent a 2D echo that showedlow normal EF of 52%, global hypokinesis with no LVH and grade 2 DD with myxomatous MV, mild to moderate TVR, mild to moderate AVR.  He has no interval ED visits and underwent successful hernia repair in 05/2022.  During today's visit the patient reports*** .  Patient denies chest pain, palpitations, dyspnea, PND, orthopnea, nausea, vomiting, dizziness, syncope, edema, weight gain, or early satiety.  ***Notes: -Preop clearance for lumbar fusion Review of Systems  Please see the history of present illness.    All other systems reviewed and are otherwise negative except as noted  above.  Physical Exam    Wt Readings from Last 3 Encounters:  11/14/22 121 lb 11.2 oz (55.2 kg)  10/07/22 126 lb (57.2 kg)  05/23/22 123 lb (55.8 kg)   OZ:HYQMV were no vitals filed for this visit.,There is no height or weight on file to calculate BMI. GEN: Well nourished, well developed in no acute distress Neck: No JVD; No carotid bruits Pulmonary: Clear to auscultation without rales, wheezing or rhonchi  Cardiovascular: Normal rate. Regular rhythm. Normal S1. Normal S2.   Murmurs: There is no murmur.  ABDOMEN: Soft, non-tender, non-distended EXTREMITIES:  No edema; No deformity   EKG/LABS/ Recent Cardiac Studies   ECG personally reviewed by me today - ***  Risk Assessment/Calculations:   {Does this patient have ATRIAL FIBRILLATION?:[phone]}      Lab Results  Component Value Date   WBC 5.2 11/14/2022   HGB 12.1 (L) 11/14/2022   HCT 36.2 (L) 11/14/2022   MCV 101.1 (H) 11/14/2022   PLT 138 (L) 11/14/2022   Lab Results  Component Value Date   CREATININE 1.81 (H) 11/14/2022   BUN 23 11/14/2022   NA 137 11/14/2022   K 4.3 11/14/2022   CL 105 11/14/2022   CO2 24 11/14/2022   Lab Results  Component Value Date   CHOL 98 (L) 04/01/2022   HDL 39 (L) 04/01/2022  LDLCALC 39 04/01/2022   TRIG 104 04/01/2022   CHOLHDL 2.5 04/01/2022    No results found for: "HGBA1C" Assessment & Plan    1.  Preoperative clearance: -Patient's RCRI score is 6.6%  2.  Coronary artery disease: -s/p CABG x 4 in 2000 and  3.  Essential hypertension: -Patient's blood pressure today was***  4.  Hyperlipidemia: -Patient's last LDL cholesterol was***  5.  Nonrheumatic aortic insufficiency: -2D echo completed 10/2021 showing mild to moderate AVR      Disposition: Follow-up with Kristeen Miss, MD or APP in *** months {Are you ordering a CV Procedure (e.g. stress test, cath, DCCV, TEE, etc)?   Press F2        :[id number]}   Signed, Napoleon Form, Leodis Rains, NP 11/16/2022, 3:25 PM [facility]

## 2022-11-17 ENCOUNTER — Ambulatory Visit: Payer: Medicare PPO | Attending: Nurse Practitioner | Admitting: Nurse Practitioner

## 2022-11-17 ENCOUNTER — Encounter: Payer: Self-pay | Admitting: Nurse Practitioner

## 2022-11-17 VITALS — BP 150/78 | HR 68 | Ht 67.0 in | Wt 122.8 lb

## 2022-11-17 DIAGNOSIS — Z01810 Encounter for preprocedural cardiovascular examination: Secondary | ICD-10-CM | POA: Diagnosis not present

## 2022-11-17 DIAGNOSIS — E782 Mixed hyperlipidemia: Secondary | ICD-10-CM | POA: Diagnosis not present

## 2022-11-17 DIAGNOSIS — I2510 Atherosclerotic heart disease of native coronary artery without angina pectoris: Secondary | ICD-10-CM | POA: Diagnosis not present

## 2022-11-17 DIAGNOSIS — I10 Essential (primary) hypertension: Secondary | ICD-10-CM | POA: Diagnosis not present

## 2022-11-17 DIAGNOSIS — I351 Nonrheumatic aortic (valve) insufficiency: Secondary | ICD-10-CM

## 2022-11-17 NOTE — Anesthesia Preprocedure Evaluation (Addendum)
Anesthesia Evaluation  Patient identified by MRN, date of birth, ID band Patient awake    Reviewed: Allergy & Precautions, H&P , NPO status , Patient's Chart, lab work & pertinent test results  Airway Mallampati: II  TM Distance: >3 FB Neck ROM: Full    Dental no notable dental hx. (+) Teeth Intact, Dental Advisory Given   Pulmonary neg pulmonary ROS, Current Smoker and Patient abstained from smoking., former smoker   Pulmonary exam normal breath sounds clear to auscultation       Cardiovascular Exercise Tolerance: Good hypertension, Pt. on medications + CAD and + CABG  + Valvular Problems/Murmurs AI and MR  Rhythm:Regular Rate:Normal     Neuro/Psych negative neurological ROS  negative psych ROS   GI/Hepatic Neg liver ROS, hiatal hernia,GERD  Medicated and Controlled,,  Endo/Other  negative endocrine ROS    Renal/GU Renal InsufficiencyRenal disease  negative genitourinary   Musculoskeletal  (+) Arthritis , Osteoarthritis,    Abdominal   Peds  Hematology negative hematology ROS (+)   Anesthesia Other Findings   Reproductive/Obstetrics negative OB ROS                             Anesthesia Physical Anesthesia Plan  ASA: 3  Anesthesia Plan: General   Post-op Pain Management: Tylenol PO (pre-op)*   Induction: Intravenous  PONV Risk Score and Plan: 3 and Ondansetron, Dexamethasone and Treatment may vary due to age or medical condition  Airway Management Planned: Oral ETT  Additional Equipment:   Intra-op Plan:   Post-operative Plan: Extubation in OR  Informed Consent: I have reviewed the patients History and Physical, chart, labs and discussed the procedure including the risks, benefits and alternatives for the proposed anesthesia with the patient or authorized representative who has indicated his/her understanding and acceptance.     Dental advisory given  Plan Discussed  with: CRNA  Anesthesia Plan Comments: (PAT note by Antionette Poles, PA-C: Follows cardiology for history of CAD s/p CABG x4 in 2000, HLD, HTN, moderate aortic regurgitation.  He was last seen by Dr. 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  He underwent a 2D echo that showedlow normal EF of 52%, global hypokinesis with no LVH and grade 2 DD with myxomatous MV, mild to moderate TVR, mild to moderate AVR.   Seen by Robin Searing, NP 11/17/2022 for preop evaluation.  Per note, "-Patient's RCRI score is 6.6% -The patient affirms he has been doing well without any new cardiac symptoms. They are able to achieve 6 METS without cardiac limitations. Therefore, based on ACC/AHA guidelines, the patient would be at acceptable risk for the planned procedure without further cardiovascular testing. The patient was advised that if he develops new symptoms prior to surgery to contact our office to arrange for a follow-up visit, and he verbalized understanding."  History of CKD 3.  Psoriatic arthritis.  History of GERD and hiatal hernia.  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  Echo 10/21/2021 1. Left ventricular ejection fraction by 3D volume is 52 %. The left  ventricle has low normal function. The left ventricle demonstrates global  hypokinesis. Left ventricular diastolic parameters are consistent with  Grade II diastolic dysfunction  (pseudonormalization).  2. Right ventricular systolic function is normal. The right ventricular  size is normal. There  is normal pulmonary artery systolic pressure.  3. Left atrial size was mildly dilated.  4. The mitral valve is myxomatous. Mild mitral valve regurgitation. No  evidence of mitral stenosis.  5. Tricuspid valve regurgitation is mild to moderate.  6. The aortic valve is tricuspid.  There is moderate thickening of the  aortic valve. Aortic valve regurgitation is mild to moderate. Aortic valve  sclerosis/calcification is present, without any evidence of aortic  stenosis.  7. The inferior vena cava is normal in size with <50% respiratory  variability, suggesting right atrial pressure of 8 mmHg.    )        Anesthesia Quick Evaluation

## 2022-11-17 NOTE — Progress Notes (Signed)
Anesthesia Chart Review:  Follows cardiology for history of CAD s/p CABG x4 in 2000, HLD, HTN, moderate aortic regurgitation.  He was last seen by Dr. 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  He underwent a 2D echo that showedlow normal EF of 52%, global hypokinesis with no LVH and grade 2 DD with myxomatous MV, mild to moderate TVR, mild to moderate AVR.   Seen by Robin Searing, NP 11/17/2022 for preop evaluation.  Per note, "-Patient's RCRI score is 6.6% -The patient affirms he has been doing well without any new cardiac symptoms. They are able to achieve 6 METS without cardiac limitations. Therefore, based on ACC/AHA guidelines, the patient would be at acceptable risk for the planned procedure without further cardiovascular testing. The patient was advised that if he develops new symptoms prior to surgery to contact our office to arrange for a follow-up visit, and he verbalized understanding."  History of CKD 3.  Psoriatic arthritis.  History of GERD and hiatal hernia.  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  Echo 10/21/2021 1. Left ventricular ejection fraction by 3D volume is 52 %. The left  ventricle has low normal function. The left ventricle demonstrates global  hypokinesis. Left ventricular diastolic parameters are consistent with  Grade II diastolic dysfunction  (pseudonormalization).   2. Right ventricular systolic function is normal. The right ventricular  size is normal. There is normal pulmonary artery systolic pressure.   3. Left atrial size was mildly dilated.   4. The mitral valve is myxomatous. Mild mitral valve regurgitation. No  evidence of mitral stenosis.   5. Tricuspid valve regurgitation is mild to moderate.   6. The aortic valve is tricuspid. There is moderate thickening of the  aortic  valve. Aortic valve regurgitation is mild to moderate. Aortic valve  sclerosis/calcification is present, without any evidence of aortic  stenosis.   7. The inferior vena cava is normal in size with <50% respiratory  variability, suggesting right atrial pressure of 8 mmHg.      [facility] Short Stay Center/Anesthesiology Phone [phone] 11/17/2022 11:15 AM

## 2022-11-17 NOTE — Patient Instructions (Addendum)
Medication Instructions:  Your physician recommends that you continue on your current medications as directed. Please refer to the Current Medication list given to you today. *If you need a refill on your cardiac medications before your next appointment, please call your pharmacy*   Lab Work: None ordered   Testing/Procedures: NONE ORDERED   Follow-Up: At [facility], you and your health needs are our priority.  As part of our continuing mission to provide you with exceptional heart care, we have created designated Provider Care Teams.  These Care Teams include your primary Cardiologist (physician) and Advanced Practice Providers (APPs -  Physician Assistants and Nurse Practitioners) who all work together to provide you with the care you need, when you need it.  We recommend signing up for the patient portal called "MyChart".  Sign up information is provided on this After Visit Summary.  MyChart is used to connect with patients for Virtual Visits (Telemedicine).  Patients are able to view lab/test results, encounter notes, upcoming appointments, etc.  Non-urgent messages can be sent to your provider as well.   To learn more about what you can do with MyChart, go to [email or web address].    Your next appointment:   12 month(s)  Provider:   Kristeen Miss, MD     Other Instructions

## 2022-11-20 ENCOUNTER — Other Ambulatory Visit: Payer: Self-pay | Admitting: Family Medicine

## 2022-11-20 DIAGNOSIS — N521 Erectile dysfunction due to diseases classified elsewhere: Secondary | ICD-10-CM

## 2022-11-21 ENCOUNTER — Encounter (HOSPITAL_COMMUNITY)
Admission: RE | Disposition: A | Discharge status: Home / Self Care | Payer: Self-pay | Source: Home / Self Care | Attending: Neurological Surgery

## 2022-11-21 ENCOUNTER — Encounter (HOSPITAL_COMMUNITY): Payer: Self-pay | Admitting: Neurological Surgery

## 2022-11-21 ENCOUNTER — Ambulatory Visit (HOSPITAL_COMMUNITY): Payer: Medicare PPO | Admitting: Physician Assistant

## 2022-11-21 ENCOUNTER — Other Ambulatory Visit: Payer: Self-pay

## 2022-11-21 ENCOUNTER — Observation Stay (HOSPITAL_COMMUNITY)
Admission: RE | Admit: 2022-11-21 | Discharge: 2022-11-22 | Disposition: A | Discharge status: Home / Self Care | Payer: Medicare PPO | Attending: Neurological Surgery | Admitting: Neurological Surgery

## 2022-11-21 ENCOUNTER — Ambulatory Visit (HOSPITAL_COMMUNITY): Payer: Medicare PPO

## 2022-11-21 ENCOUNTER — Ambulatory Visit (HOSPITAL_COMMUNITY): Payer: Medicare PPO | Admitting: Anesthesiology

## 2022-11-21 DIAGNOSIS — Z21 Asymptomatic human immunodeficiency virus [HIV] infection status: Secondary | ICD-10-CM | POA: Insufficient documentation

## 2022-11-21 DIAGNOSIS — F17210 Nicotine dependence, cigarettes, uncomplicated: Secondary | ICD-10-CM | POA: Insufficient documentation

## 2022-11-21 DIAGNOSIS — Z79899 Other long term (current) drug therapy: Secondary | ICD-10-CM | POA: Diagnosis not present

## 2022-11-21 DIAGNOSIS — Z85828 Personal history of other malignant neoplasm of skin: Secondary | ICD-10-CM | POA: Diagnosis not present

## 2022-11-21 DIAGNOSIS — Z01818 Encounter for other preprocedural examination: Principal | ICD-10-CM

## 2022-11-21 DIAGNOSIS — M532X6 Spinal instabilities, lumbar region: Secondary | ICD-10-CM | POA: Diagnosis not present

## 2022-11-21 DIAGNOSIS — M5126 Other intervertebral disc displacement, lumbar region: Secondary | ICD-10-CM | POA: Diagnosis not present

## 2022-11-21 DIAGNOSIS — M48061 Spinal stenosis, lumbar region without neurogenic claudication: Secondary | ICD-10-CM | POA: Insufficient documentation

## 2022-11-21 DIAGNOSIS — F17200 Nicotine dependence, unspecified, uncomplicated: Secondary | ICD-10-CM | POA: Diagnosis not present

## 2022-11-21 DIAGNOSIS — N183 Chronic kidney disease, stage 3 unspecified: Secondary | ICD-10-CM | POA: Insufficient documentation

## 2022-11-21 DIAGNOSIS — M48062 Spinal stenosis, lumbar region with neurogenic claudication: Secondary | ICD-10-CM | POA: Diagnosis not present

## 2022-11-21 DIAGNOSIS — M5186 Other intervertebral disc disorders, lumbar region: Secondary | ICD-10-CM | POA: Diagnosis not present

## 2022-11-21 DIAGNOSIS — I129 Hypertensive chronic kidney disease with stage 1 through stage 4 chronic kidney disease, or unspecified chronic kidney disease: Secondary | ICD-10-CM | POA: Diagnosis not present

## 2022-11-21 DIAGNOSIS — Z981 Arthrodesis status: Secondary | ICD-10-CM | POA: Diagnosis not present

## 2022-11-21 DIAGNOSIS — N1831 Chronic kidney disease, stage 3a: Secondary | ICD-10-CM | POA: Diagnosis not present

## 2022-11-21 DIAGNOSIS — Z951 Presence of aortocoronary bypass graft: Secondary | ICD-10-CM | POA: Insufficient documentation

## 2022-11-21 DIAGNOSIS — B20 Human immunodeficiency virus [HIV] disease: Secondary | ICD-10-CM | POA: Insufficient documentation

## 2022-11-21 LAB — ABO/RH: ABO/RH(D): A NEG

## 2022-11-21 SURGERY — POSTERIOR LUMBAR FUSION 1 LEVEL
Anesthesia: General | Site: Back

## 2022-11-21 MED ORDER — ONDANSETRON HCL 4 MG/2ML IJ SOLN
INTRAMUSCULAR | Status: DC | PRN
  Administered 2022-11-21: 4 mg via INTRAVENOUS

## 2022-11-21 MED ORDER — METHOCARBAMOL 500 MG PO TABS
500.0000 mg | ORAL_TABLET | Freq: Four times a day (QID) | ORAL | Status: DC | PRN
  Administered 2022-11-21 – 2022-11-22 (×3): 500 mg via ORAL
  Filled 2022-11-21 (×2): qty 1

## 2022-11-21 MED ORDER — DEXAMETHASONE 4 MG PO TABS
4.0000 mg | ORAL_TABLET | Freq: Four times a day (QID) | ORAL | Status: DC
  Administered 2022-11-21 – 2022-11-22 (×2): 4 mg via ORAL
  Filled 2022-11-21 (×2): qty 1

## 2022-11-21 MED ORDER — SODIUM CHLORIDE 0.9 % IV SOLN
250.0000 mL | INTRAVENOUS | Status: DC
  Administered 2022-11-21: 250 mL via INTRAVENOUS

## 2022-11-21 MED ORDER — ACETAMINOPHEN 650 MG RE SUPP: 650.0000 mg | RECTAL | Status: DC | PRN

## 2022-11-21 MED ORDER — GABAPENTIN 300 MG PO CAPS
300.0000 mg | ORAL_CAPSULE | ORAL | Status: AC
  Administered 2022-11-21: 300 mg via ORAL
  Filled 2022-11-21: qty 1

## 2022-11-21 MED ORDER — SUGAMMADEX SODIUM 200 MG/2ML IV SOLN
INTRAVENOUS | Status: DC | PRN
  Administered 2022-11-21: 150 mg via INTRAVENOUS

## 2022-11-21 MED ORDER — ACETAMINOPHEN 500 MG PO TABS
1000.0000 mg | ORAL_TABLET | ORAL | Status: AC
  Administered 2022-11-21: 1000 mg via ORAL
  Filled 2022-11-21: qty 2

## 2022-11-21 MED ORDER — HYDROMORPHONE HCL 1 MG/ML IJ SOLN
INTRAMUSCULAR | Status: AC
  Filled 2022-11-21: qty 1

## 2022-11-21 MED ORDER — CHLORHEXIDINE GLUCONATE CLOTH 2 % EX PADS: 6.0000 | MEDICATED_PAD | Freq: Once | CUTANEOUS | Status: DC

## 2022-11-21 MED ORDER — METHOCARBAMOL 500 MG PO TABS
ORAL_TABLET | ORAL | Status: AC
  Filled 2022-11-21: qty 1

## 2022-11-21 MED ORDER — ORAL CARE MOUTH RINSE: 15.0000 mL | Freq: Once | OROMUCOSAL | Status: AC

## 2022-11-21 MED ORDER — THROMBIN 5000 UNITS EX SOLR
CUTANEOUS | Status: AC
  Filled 2022-11-21: qty 5000

## 2022-11-21 MED ORDER — BUPIVACAINE HCL (PF) 0.25 % IJ SOLN
INTRAMUSCULAR | Status: AC
  Filled 2022-11-21: qty 30

## 2022-11-21 MED ORDER — LIDOCAINE 2% (20 MG/ML) 5 ML SYRINGE
INTRAMUSCULAR | Status: AC
  Filled 2022-11-21: qty 5

## 2022-11-21 MED ORDER — ROCURONIUM BROMIDE 10 MG/ML (PF) SYRINGE
PREFILLED_SYRINGE | INTRAVENOUS | Status: DC | PRN
  Administered 2022-11-21: 10 mg via INTRAVENOUS
  Administered 2022-11-21: 60 mg via INTRAVENOUS

## 2022-11-21 MED ORDER — DEXAMETHASONE SODIUM PHOSPHATE 10 MG/ML IJ SOLN
INTRAMUSCULAR | Status: DC | PRN
  Administered 2022-11-21: 10 mg via INTRAVENOUS

## 2022-11-21 MED ORDER — ONDANSETRON HCL 4 MG/2ML IJ SOLN: 4.0000 mg | Freq: Four times a day (QID) | INTRAMUSCULAR | Status: DC | PRN

## 2022-11-21 MED ORDER — PROPOFOL 10 MG/ML IV BOLUS
INTRAVENOUS | Status: DC | PRN
  Administered 2022-11-21: 100 mg via INTRAVENOUS

## 2022-11-21 MED ORDER — ADULT MULTIVITAMIN W/MINERALS CH: 1.0000 | ORAL_TABLET | Freq: Every day | ORAL | Status: DC

## 2022-11-21 MED ORDER — LACTATED RINGERS IV SOLN: INTRAVENOUS | Status: DC | PRN

## 2022-11-21 MED ORDER — LOSARTAN POTASSIUM-HCTZ 50-12.5 MG PO TABS: 1.0000 | ORAL_TABLET | Freq: Every day | ORAL | Status: DC

## 2022-11-21 MED ORDER — SURGIRINSE WOUND IRRIGATION SYSTEM - OPTIME: TOPICAL | Status: DC | PRN

## 2022-11-21 MED ORDER — PHENOL 1.4 % MT LIQD: 1.0000 | OROMUCOSAL | Status: DC | PRN

## 2022-11-21 MED ORDER — HYDROMORPHONE HCL 1 MG/ML IJ SOLN: 0.5000 mg | INTRAMUSCULAR | Status: DC | PRN

## 2022-11-21 MED ORDER — SODIUM CHLORIDE 0.9% FLUSH: 3.0000 mL | INTRAVENOUS | Status: DC | PRN

## 2022-11-21 MED ORDER — ONDANSETRON HCL 4 MG/2ML IJ SOLN
INTRAMUSCULAR | Status: AC
  Filled 2022-11-21: qty 2

## 2022-11-21 MED ORDER — THROMBIN 20000 UNITS EX SOLR
CUTANEOUS | Status: AC
  Filled 2022-11-21: qty 20000

## 2022-11-21 MED ORDER — HYDROCHLOROTHIAZIDE 12.5 MG PO TABS
12.5000 mg | ORAL_TABLET | Freq: Every day | ORAL | Status: DC
  Administered 2022-11-22: 12.5 mg via ORAL
  Filled 2022-11-21: qty 1

## 2022-11-21 MED ORDER — LOSARTAN POTASSIUM 50 MG PO TABS
50.0000 mg | ORAL_TABLET | Freq: Every day | ORAL | Status: DC
  Administered 2022-11-22: 50 mg via ORAL
  Filled 2022-11-21: qty 1

## 2022-11-21 MED ORDER — POTASSIUM CHLORIDE IN NACL 20-0.9 MEQ/L-% IV SOLN: INTRAVENOUS | Status: DC

## 2022-11-21 MED ORDER — FENTANYL CITRATE (PF) 250 MCG/5ML IJ SOLN
INTRAMUSCULAR | Status: AC
  Filled 2022-11-21: qty 5

## 2022-11-21 MED ORDER — CHLORHEXIDINE GLUCONATE 0.12 % MT SOLN
15.0000 mL | Freq: Once | OROMUCOSAL | Status: AC
  Administered 2022-11-21: 15 mL via OROMUCOSAL
  Filled 2022-11-21: qty 15

## 2022-11-21 MED ORDER — OXYCODONE-ACETAMINOPHEN 5-325 MG PO TABS
1.0000 | ORAL_TABLET | ORAL | Status: DC | PRN
  Administered 2022-11-21 – 2022-11-22 (×4): 1 via ORAL
  Filled 2022-11-21 (×3): qty 1

## 2022-11-21 MED ORDER — 0.9 % SODIUM CHLORIDE (POUR BTL) OPTIME
TOPICAL | Status: DC | PRN
  Administered 2022-11-21: 1000 mL

## 2022-11-21 MED ORDER — SUCCINYLCHOLINE CHLORIDE 200 MG/10ML IV SOSY
PREFILLED_SYRINGE | INTRAVENOUS | Status: AC
  Filled 2022-11-21: qty 10

## 2022-11-21 MED ORDER — HYDROMORPHONE HCL 1 MG/ML IJ SOLN
0.2500 mg | INTRAMUSCULAR | Status: DC | PRN
  Administered 2022-11-21 (×4): 0.5 mg via INTRAVENOUS

## 2022-11-21 MED ORDER — ONDANSETRON HCL 4 MG PO TABS: 4.0000 mg | ORAL_TABLET | Freq: Four times a day (QID) | ORAL | Status: DC | PRN

## 2022-11-21 MED ORDER — SENNA 8.6 MG PO TABS
1.0000 | ORAL_TABLET | Freq: Two times a day (BID) | ORAL | Status: DC
  Administered 2022-11-21: 8.6 mg via ORAL
  Filled 2022-11-21: qty 1

## 2022-11-21 MED ORDER — OXYCODONE-ACETAMINOPHEN 5-325 MG PO TABS
ORAL_TABLET | ORAL | Status: AC
  Filled 2022-11-21: qty 1

## 2022-11-21 MED ORDER — LACTATED RINGERS IV SOLN: INTRAVENOUS | Status: DC

## 2022-11-21 MED ORDER — DOLUTEGRAVIR-LAMIVUDINE 50-300 MG PO TABS
1.0000 | ORAL_TABLET | Freq: Every day | ORAL | Status: DC
  Filled 2022-11-21: qty 1

## 2022-11-21 MED ORDER — CEFAZOLIN SODIUM-DEXTROSE 2-4 GM/100ML-% IV SOLN
2.0000 g | INTRAVENOUS | Status: AC
  Administered 2022-11-21: 2 g via INTRAVENOUS
  Filled 2022-11-21: qty 100

## 2022-11-21 MED ORDER — MENTHOL 3 MG MT LOZG: 1.0000 | LOZENGE | OROMUCOSAL | Status: DC | PRN

## 2022-11-21 MED ORDER — DEXAMETHASONE SODIUM PHOSPHATE 4 MG/ML IJ SOLN
4.0000 mg | Freq: Four times a day (QID) | INTRAMUSCULAR | Status: DC
  Administered 2022-11-21: 4 mg via INTRAVENOUS
  Filled 2022-11-21: qty 1

## 2022-11-21 MED ORDER — SODIUM CHLORIDE 0.9% FLUSH: 3.0000 mL | Freq: Two times a day (BID) | INTRAVENOUS | Status: DC

## 2022-11-21 MED ORDER — ACETAMINOPHEN 325 MG PO TABS: 650.0000 mg | ORAL_TABLET | ORAL | Status: DC | PRN

## 2022-11-21 MED ORDER — EZETIMIBE 10 MG PO TABS
10.0000 mg | ORAL_TABLET | Freq: Every day | ORAL | Status: DC
  Administered 2022-11-21: 10 mg via ORAL
  Filled 2022-11-21: qty 1

## 2022-11-21 MED ORDER — PHENYLEPHRINE HCL-NACL 20-0.9 MG/250ML-% IV SOLN
INTRAVENOUS | Status: DC | PRN
  Administered 2022-11-21: 20 ug/min via INTRAVENOUS

## 2022-11-21 MED ORDER — PROPOFOL 10 MG/ML IV BOLUS
INTRAVENOUS | Status: AC
  Filled 2022-11-21: qty 20

## 2022-11-21 MED ORDER — CELECOXIB 200 MG PO CAPS
200.0000 mg | ORAL_CAPSULE | Freq: Two times a day (BID) | ORAL | Status: DC
  Administered 2022-11-21 – 2022-11-22 (×2): 200 mg via ORAL
  Filled 2022-11-21 (×2): qty 1

## 2022-11-21 MED ORDER — CEFAZOLIN SODIUM-DEXTROSE 2-4 GM/100ML-% IV SOLN
2.0000 g | Freq: Three times a day (TID) | INTRAVENOUS | Status: AC
  Administered 2022-11-21 (×2): 2 g via INTRAVENOUS
  Filled 2022-11-21 (×2): qty 100

## 2022-11-21 MED ORDER — DEXAMETHASONE SODIUM PHOSPHATE 10 MG/ML IJ SOLN
INTRAMUSCULAR | Status: AC
  Filled 2022-11-21: qty 1

## 2022-11-21 MED ORDER — THROMBIN 5000 UNITS EX SOLR: OROMUCOSAL | Status: DC | PRN

## 2022-11-21 MED ORDER — BUPIVACAINE HCL (PF) 0.25 % IJ SOLN
INTRAMUSCULAR | Status: DC | PRN
  Administered 2022-11-21: 7 mL

## 2022-11-21 MED ORDER — ROCURONIUM BROMIDE 10 MG/ML (PF) SYRINGE
PREFILLED_SYRINGE | INTRAVENOUS | Status: AC
  Filled 2022-11-21: qty 10

## 2022-11-21 MED ORDER — FENTANYL CITRATE (PF) 250 MCG/5ML IJ SOLN
INTRAMUSCULAR | Status: DC | PRN
  Administered 2022-11-21 (×3): 50 ug via INTRAVENOUS

## 2022-11-21 MED ORDER — LIDOCAINE 2% (20 MG/ML) 5 ML SYRINGE
INTRAMUSCULAR | Status: DC | PRN
  Administered 2022-11-21: 60 mg via INTRAVENOUS

## 2022-11-21 MED ORDER — THROMBIN 20000 UNITS EX SOLR: CUTANEOUS | Status: DC | PRN

## 2022-11-21 MED ORDER — METHOCARBAMOL 1000 MG/10ML IJ SOLN: 500.0000 mg | Freq: Four times a day (QID) | INTRAVENOUS | Status: DC | PRN

## 2022-11-21 SURGICAL SUPPLY — 64 items
ADH SKN CLS APL DERMABOND .7 (GAUZE/BANDAGES/DRESSINGS) ×1
APL SKNCLS STERI-STRIP NONHPOA (GAUZE/BANDAGES/DRESSINGS) ×1
BAG COUNTER SPONGE SURGICOUNT (BAG) ×1 IMPLANT
BAG SPNG CNTER NS LX DISP (BAG) ×1
BASKET BONE COLLECTION (BASKET) ×1 IMPLANT
BENZOIN TINCTURE PRP APPL 2/3 (GAUZE/BANDAGES/DRESSINGS) ×1 IMPLANT
BLADE BONE MILL MEDIUM (MISCELLANEOUS) ×1 IMPLANT
BLADE CLIPPER SURG (BLADE) IMPLANT
BUR CARBIDE MATCH 3.0 (BURR) ×1 IMPLANT
CANISTER SUCT 3000ML PPV (MISCELLANEOUS) ×1 IMPLANT
CNTNR URN SCR LID CUP LEK RST (MISCELLANEOUS) ×1 IMPLANT
CONT SPEC 4OZ STRL OR WHT (MISCELLANEOUS) ×1
COVER BACK TABLE 60X90IN (DRAPES) ×1 IMPLANT
DERMABOND ADVANCED .7 DNX12 (GAUZE/BANDAGES/DRESSINGS) ×1 IMPLANT
DRAPE C-ARM 42X72 X-RAY (DRAPES) ×2 IMPLANT
DRAPE C-ARMOR (DRAPES) ×1 IMPLANT
DRAPE LAPAROTOMY 100X72X124 (DRAPES) ×1 IMPLANT
DRAPE SURG 17X23 STRL (DRAPES) ×1 IMPLANT
DRSG OPSITE POSTOP 4X6 (GAUZE/BANDAGES/DRESSINGS) IMPLANT
DURAPREP 26ML APPLICATOR (WOUND CARE) ×1 IMPLANT
ELECT REM PT RETURN 9FT ADLT (ELECTROSURGICAL) ×1
ELECTRODE REM PT RTRN 9FT ADLT (ELECTROSURGICAL) ×1 IMPLANT
EVACUATOR 1/8 PVC DRAIN (DRAIN) ×1 IMPLANT
FIBER BONE ALLOSYNC EXPAND 5 (Bone Implant) IMPLANT
GAUZE 4X4 16PLY ~~LOC~~+RFID DBL (SPONGE) IMPLANT
GLOVE BIO SURGEON STRL SZ7 (GLOVE) IMPLANT
GLOVE BIO SURGEON STRL SZ8 (GLOVE) ×2 IMPLANT
GLOVE BIOGEL PI IND STRL 7.0 (GLOVE) IMPLANT
GOWN STRL REUS W/ TWL LRG LVL3 (GOWN DISPOSABLE) IMPLANT
GOWN STRL REUS W/ TWL XL LVL3 (GOWN DISPOSABLE) ×2 IMPLANT
GOWN STRL REUS W/TWL 2XL LVL3 (GOWN DISPOSABLE) IMPLANT
GOWN STRL REUS W/TWL LRG LVL3 (GOWN DISPOSABLE)
GOWN STRL REUS W/TWL XL LVL3 (GOWN DISPOSABLE) ×2
HEMOSTAT POWDER KIT SURGIFOAM (HEMOSTASIS) ×1 IMPLANT
KIT BASIN OR (CUSTOM PROCEDURE TRAY) ×1 IMPLANT
KIT GRAFTMAG DEL NEURO DISP (NEUROSURGERY SUPPLIES) IMPLANT
KIT POSITION SURG JACKSON T1 (MISCELLANEOUS) ×1 IMPLANT
KIT TURNOVER KIT B (KITS) ×1 IMPLANT
MILL BONE PREP (MISCELLANEOUS) ×1 IMPLANT
NDL HYPO 25X1 1.5 SAFETY (NEEDLE) ×1 IMPLANT
NEEDLE HYPO 25X1 1.5 SAFETY (NEEDLE) ×1 IMPLANT
NS IRRIG 1000ML POUR BTL (IV SOLUTION) ×1 IMPLANT
PACK LAMINECTOMY NEURO (CUSTOM PROCEDURE TRAY) ×1 IMPLANT
PAD ARMBOARD 7.5X6 YLW CONV (MISCELLANEOUS) ×3 IMPLANT
PUTTY BONE 2.5CC (Putty) IMPLANT
ROD LORD LIPPED TI 5.5X40 (Rod) IMPLANT
SCREW CANC SHANK MOD 5.5X45 (Screw) IMPLANT
SCREW KODIAK 5.5X45 (Screw) IMPLANT
SCREW POLYAXIAL TULIP (Screw) IMPLANT
SET SCREW (Screw) ×4 IMPLANT
SET SCREW SPNE (Screw) IMPLANT
SOLUTION IRRIG SURGIPHOR (IV SOLUTION) ×1 IMPLANT
SPACER IDENTITI PS 10X9X25 10D (Spacer) IMPLANT
SPONGE SURGIFOAM ABS GEL 100 (HEMOSTASIS) ×1 IMPLANT
SPONGE T-LAP 4X18 ~~LOC~~+RFID (SPONGE) IMPLANT
STRIP CLOSURE SKIN 1/2X4 (GAUZE/BANDAGES/DRESSINGS) ×2 IMPLANT
SUT VIC AB 0 CT1 18XCR BRD8 (SUTURE) ×1 IMPLANT
SUT VIC AB 0 CT1 8-18 (SUTURE) ×1
SUT VIC AB 2-0 CP2 18 (SUTURE) ×1 IMPLANT
SUT VIC AB 3-0 SH 8-18 (SUTURE) ×2 IMPLANT
TOWEL GREEN STERILE (TOWEL DISPOSABLE) ×1 IMPLANT
TOWEL GREEN STERILE FF (TOWEL DISPOSABLE) ×1 IMPLANT
TRAY FOLEY MTR SLVR 16FR STAT (SET/KITS/TRAYS/PACK) ×1 IMPLANT
WATER STERILE IRR 1000ML POUR (IV SOLUTION) ×1 IMPLANT

## 2022-11-21 NOTE — Anesthesia Postprocedure Evaluation (Signed)
Anesthesia Post Note  Patient: [name]  Procedure(s) Performed: Posterior Lumbar Interbody Fusion - Lumbar Three-Lumbar Four - Posterior Lateral and Interbody Fusion (Back)     Patient location during evaluation: PACU Anesthesia Type: General Level of consciousness: awake and alert Pain management: pain level controlled Vital Signs Assessment: post-procedure vital signs reviewed and stable Respiratory status: spontaneous breathing, nonlabored ventilation and respiratory function stable Cardiovascular status: blood pressure returned to baseline and stable Postop Assessment: no apparent nausea or vomiting Anesthetic complications: no  No notable events documented.  Last Vitals:  Vitals:   11/21/22 1330 11/21/22 1345  BP: (!) 158/80 (!) 141/73  Pulse: 60 (!) 55  Resp: (!) 22   Temp: 36.6 C   SpO2: 95% 93%    Last Pain:  Vitals:   11/21/22 1345  TempSrc:   PainSc: Asleep                 [name],W. EDMOND

## 2022-11-21 NOTE — Discharge Instructions (Signed)
Wound Care Keep incision covered and dry until post op day 3. You may remove the Honeycomb dressing on post op day 3. Leave steri-strips on back.  They will fall off by themselves. Do not put any creams, lotions, or ointments on incision. You are fine to shower. Let water run over incision and pat dry.  Activity Activity Walk each and every day, increasing distance each day. No lifting greater than 8 lbs.  No lifting no bending no twisting no driving or riding a car unless coming back and forth to see the doctor. If provided with back brace, wear when out of bed.  It is not necessary to wear brace in bed.  Diet Resume your normal diet.   Return to Work Will be discussed at your follow up appointment.  Call Your Doctor If Any of These Occur Redness, drainage, or swelling at the wound.  Temperature greater than 101 degrees. Severe pain not relieved by pain medication. Incision starts to come apart.  Follow Up Appt Call [phone] if you have one or any problem.

## 2022-11-21 NOTE — Anesthesia Procedure Notes (Signed)
Procedure Name: Intubation Date/Time: 11/21/2022 7:55 PM  Performed by: Gaynelle Adu, MDPre-anesthesia Checklist: Patient identified, Emergency Drugs available, Suction available and Patient being monitored Patient Re-evaluated:Patient Re-evaluated prior to induction Oxygen Delivery Method: Circle system utilized Preoxygenation: Pre-oxygenation with 100% oxygen Induction Type: IV induction Ventilation: Mask ventilation without difficulty Laryngoscope Size: Miller and 2 Grade View: Grade I Tube type: Oral Tube size: 7.5 mm Number of attempts: 1 Airway Equipment and Method: Stylet Placement Confirmation: ETT inserted through vocal cords under direct vision, positive ETCO2 and breath sounds checked- equal and bilateral Secured at: 21 cm Tube secured with: Tape Dental Injury: Teeth and Oropharynx as per pre-operative assessment

## 2022-11-21 NOTE — Op Note (Signed)
11/21/2022  12:32 PM  PATIENT:  [name]  77 y.o. 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 Decompressive lumbar laminectomy, hemi facetectomy and foraminotomies L3-4 requiring more work than would be required for a simple exposure of the disk for PLIF in order to adequately decompress the neural elements and address the spinal stenosis 2. Posterior lumbar interbody fusion L3-4 using PTI interbody cages packed with morcellized allograft and autograft  3. Posterior fixation L3-4 using ATEC cortical pedicle screws.  4. Intertransverse arthrodesis L3-4 using morcellized autograft and allograft.  SURGEON:  Marikay Alar, MD  ASSISTANTS: Verlin Dike, FNP  ANESTHESIA:  General  EBL: 150 ml  Total I/O In: 1100 [I.V.:1000; IV Piggyback:100] Out: 575 [Urine:425; Blood:150]  BLOOD ADMINISTERED:none  DRAINS: none   INDICATION FOR PROCEDURE: This patient presented with back pain with leg pain. Imaging revealed current disc herniation with recurrent severe spinal stenosis at L3-4 with instability. The patient tried a reasonable attempt at conservative medical measures without relief. I recommended decompression and instrumented fusion to address the stenosis as well as the segmental  instability.  Patient understood the risks, benefits, and alternatives and potential outcomes and wished to proceed.  PROCEDURE DETAILS:  The patient was brought to the operating room. After induction of generalized endotracheal anesthesia the patient was rolled into the prone position on chest rolls and all pressure points were padded. The patient's lumbar region was cleaned and then prepped with DuraPrep and draped in the usual sterile fashion. Anesthesia was injected and then a dorsal midline incision was made and carried down to the lumbosacral fascia.  The fascia was opened and the paraspinous musculature was taken down in a subperiosteal fashion to expose L3-4. A self-retaining retractor was placed. Intraoperative fluoroscopy confirmed my level, and I started with placement of the L3 cortical pedicle screws. The pedicle screw entry zones were identified utilizing surface landmarks and  AP and lateral fluoroscopy. I scored the cortex with the high-speed drill and then used the hand drill to drill an upward and outward direction into the pedicle. I then tapped line to line. I then placed a 5.5 x 45 mm cortical pedicle screw into the pedicles of L3 bilaterally.    I then turned my attention to the decompression and complete lumbar laminectomies, hemi- facetectomies, and foraminotomies were performed at L3-4.  My nurse practitioner was directly involved in the decompression and exposure of the neural elements. the patient had significant spinal stenosis and this required more work than would be required for a simple exposure of the disc for posterior lumbar interbody fusion which would only require a limited laminotomy. Much more generous decompression and generous foraminotomy was undertaken in order to adequately decompress the neural elements and address the patient's leg pain. The yellow ligament was removed to expose the underlying dura and nerve roots, and generous foraminotomies were performed to adequately decompress the neural elements. Both the exiting and traversing nerve roots were decompressed on both sides until a coronary dilator passed easily along the nerve roots. Once the decompression was complete, I turned my attention to the posterior lower lumbar interbody fusion. The epidural venous vasculature was coagulated and cut sharply. Disc space was incised and the initial discectomy was performed with pituitary rongeurs. The disc space was distracted with sequential distractors to a height of 10 mm. We then used a series of scrapers  and shavers to  prepare the endplates for fusion. The midline was prepared with Epstein curettes. Once the complete discectomy was finished, we packed an appropriate sized interbody cage with local autograft and morcellized allograft, gently retracted the nerve root, and tapped the cage into position at L3-4.  The midline between the cages was packed with morselized autograft and allograft.   We then turned our attention to the placement of the lower pedicle screws. The pedicle screw entry zones were identified utilizing surface landmarks and fluoroscopy. I drilled into each pedicle utilizing the hand drill, and tapped each pedicle with the appropriate tap. We palpated with a ball probe to assure no break in the cortex. We then placed 5.5 x 45 mm pedicle screws into the pedicles bilaterally at L4.  My nurse practitioner assisted in placement of the pedicle screws.  We then decorticated the transverse processes and laid a mixture of morcellized autograft and allograft out over these to perform intertransverse arthrodesis at L3-4. We then placed lordotic rods into the multiaxial screw heads of the pedicle screws and locked these in position with the locking caps and anti-torque device. We then checked our construct with AP and lateral fluoroscopy. Irrigated with copious amounts of 0.5% povidone iodine solution followed by saline solution. Inspected the nerve roots once again to assure adequate decompression, lined to the dura with Gelfoam,  and then we closed the muscle and the fascia with 0 Vicryl. Closed the subcutaneous tissues with 2-0 Vicryl and subcuticular tissues with 3-0 Vicryl. The skin was closed with benzoin and Steri-Strips. Dressing was then applied, the patient was awakened from general anesthesia and transported to the recovery room in stable condition. At the end of the procedure all sponge, needle and instrument counts were correct.   PLAN OF CARE: admit to inpatient  PATIENT DISPOSITION:  PACU -  hemodynamically stable.   Delay start of Pharmacological VTE agent (>24hrs) due to surgical blood loss or risk of bleeding:  yes

## 2022-11-21 NOTE — Transfer of Care (Signed)
Immediate Anesthesia Transfer of Care Note  Patient: [name]  Procedure(s) Performed: Posterior Lumbar Interbody Fusion - Lumbar Three-Lumbar Four - Posterior Lateral and Interbody Fusion (Back)  Patient Location: PACU  Anesthesia Type:General  Level of Consciousness: awake and patient cooperative  Airway & Oxygen Therapy: Patient Spontanous Breathing and Patient connected to face mask oxygen  Post-op Assessment: Report given to RN, Post -op Vital signs reviewed and stable, and Patient moving all extremities X 4  Post vital signs: Reviewed and stable  Last Vitals:  Vitals Value Taken Time  BP    Temp    Pulse 68 11/21/22 1243  Resp    SpO2 100 % 11/21/22 1243  Vitals shown include unfiled device data.  Last Pain:  Vitals:   11/21/22 0846  TempSrc:   PainSc: 0-No pain      Patients Stated Pain Goal: 0 (11/21/22 0846)  Complications: No notable events documented.

## 2022-11-21 NOTE — H&P (Signed)
Subjective: Patient is a 77 y.o. male admitted for back and leg pain from recurrent stenosis. Onset of symptoms was several months ago, gradually worsening since that time.  The pain is rated severe, and is located at the across the lower back and radiates to legs. The pain is described as aching and occurs all day. The symptoms have been progressive. Symptoms are exacerbated by exercise and standing. 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   Past Medical History:  Diagnosis Date   Allergy    RHINITIS   Arthritis    ASHD (arteriosclerotic heart disease)    Cancer (HCC)    skin, currently has one of left side of forehead   Chronic kidney disease    stage 3   Former smoker QUIT 08/09/2010   GERD (gastroesophageal reflux disease)    Herpes labialis    HH (hiatus hernia)    History of kidney stones    6, passed 5 1 was removed by Lithrotrispy   HIV infection (HCC)    POSITIVE   Hypertension    Hypertriglyceridemia    Psoriasis    ARTHRITIS   Tinnitus    CHRONIC    Past Surgical History:  Procedure Laterality Date   APPENDECTOMY     CORONARY ARTERY BYPASS GRAFT  08/20/1998   CABG x4: LIMA-LAD, SVG-Intermediate, SVG-distal CX, SVG-PDA   INGUINAL HERNIA REPAIR Bilateral 05/23/2022   Procedure: OPEN BILATERAL INGUINAL HERNIA REPAIRS;  Surgeon: Berna Bue, MD;  Location: [facility];  Service: General;  Laterality: Bilateral;   LUMBAR LAMINECTOMY/DECOMPRESSION MICRODISCECTOMY Left 10/01/2020   Procedure: Laminectomy - left - Lumbar three-Lumbar four - Lumbar four-Lumbar five microdiskectomy;  Surgeon: Tia Alert, MD;  Location: [facility] OR;  Service: Neurosurgery;  Laterality: Left;    Prior to Admission medications   Medication Sig Start Date End Date Taking? Authorizing Provider  acetaminophen (TYLENOL) 500 MG tablet Take 1,000 mg by mouth every 8 (eight) hours as needed for moderate pain.   Yes [provider]  celecoxib (CELEBREX) 200 MG capsule Take 1 capsule (200 mg  total) by mouth 2 (two) times daily. 04/01/22  Yes Ronnald Nian, MD  dolutegravir-lamiVUDine (DOVATO) 50-300 MG tablet Take 1 tablet by mouth daily. 04/01/22  Yes Ronnald Nian, MD  ezetimibe (ZETIA) 10 MG tablet Take 1 tablet (10 mg total) by mouth daily. 04/01/22  Yes Ronnald Nian, MD  losartan-hydrochlorothiazide (HYZAAR) 50-12.5 MG tablet Take 1 tablet by mouth daily. 04/01/22  Yes Ronnald Nian, MD  Multiple Vitamins-Minerals (MULTIVITAMIN WITH MINERALS) tablet Take 1 tablet by mouth daily.   Yes [provider]  PERCOCET 5-325 MG tablet Take 1 tablet by mouth every 8 (eight) hours as needed. 10/02/22  Yes [provider]  rosuvastatin (CRESTOR) 20 MG tablet Take 1 tablet (20 mg total) by mouth daily. 04/01/22  Yes Ronnald Nian, MD  tadalafil (CIALIS) 5 MG tablet TAKE 1 TABLET BY MOUTH DAILY AS NEEDED FOR ERECTILE DYSFUNCTION. 11/20/22  Yes Ronnald Nian, MD  dronabinol (MARINOL) 2.5 MG capsule Take 1 capsule (2.5 mg total) by mouth 2 (two) times daily before a meal. 10/31/20   Ronnald Nian, MD  nitroGLYCERIN (NITROSTAT) 0.3 MG SL tablet Place 1 tablet (0.3 mg total) under the tongue every 5 (five) minutes as needed for chest pain. 09/18/14   Ronnald Nian, MD  Suvorexant (BELSOMRA) 10 MG TABS Take 1 tablet (10 mg total) by mouth at bedtime as needed. 04/01/22   Susann Givens,  Everardo All, MD  triamcinolone cream (KENALOG) 0.1 % Apply 1 Application topically 2 (two) times daily. Monday-Friday only for 30 days.    [provider]  valACYclovir (VALTREX) 1000 MG tablet 2 tablets twice a day for 1 day Patient taking differently: Take 1,000 mg by mouth See admin instructions. Take 2 tablets twice a day for 1 day as needed for outbreaks. 04/01/18   Ronnald Nian, MD   No Known Allergies  Social History   Tobacco Use   Smoking status: Some Days    Types: Cigarettes   Smokeless tobacco: Never  Substance Use Topics   Alcohol use: Not Currently    Comment: occasional     Family History  Problem Relation Age of Onset   Coronary artery disease Mother    Coronary artery disease Father    CAD Brother      Review of Systems  Positive ROS: neg  All other systems have been reviewed and were otherwise negative with the exception of those mentioned in the HPI and as above.  Objective: Vital signs in last 24 hours: Temp:  [98.1 F (36.7 C)] 98.1 F (36.7 C) (09/13 0830) Pulse Rate:  [77] 77 (09/13 0830) Resp:  [17] 17 (09/13 0830) BP: (177)/(83) 177/83 (09/13 0830) SpO2:  [97 %] 97 % (09/13 0830) Weight:  [56.7 kg] 56.7 kg (09/13 0830)  General Appearance: Alert, cooperative, no distress, appears stated age Head: Normocephalic, without obvious abnormality, atraumatic Eyes: PERRL, conjunctiva/corneas clear, EOM's intact    Neck: Supple, symmetrical, trachea midline Back: Symmetric, no curvature, ROM normal, no CVA tenderness Lungs:  respirations unlabored Heart: Regular rate and rhythm Abdomen: Soft, non-tender Extremities: Extremities normal, atraumatic, no cyanosis or edema Pulses: 2+ and symmetric all extremities Skin: Skin color, texture, turgor normal, no rashes or lesions  NEUROLOGIC:   Mental status: Alert and oriented x4,  no aphasia, good attention span, fund of knowledge, and memory Motor Exam - grossly normal Sensory Exam - grossly normal Reflexes: 1= Coordination - grossly normal Gait - grossly normal Balance - grossly normal Cranial Nerves: I: smell Not tested  II: visual acuity  OS: nl    OD: nl  II: visual fields Full to confrontation  II: pupils Equal, round, reactive to light  III,VII: ptosis None  III,IV,VI: extraocular muscles  Full ROM  V: mastication Normal  V: facial light touch sensation  Normal  V,VII: corneal reflex  Present  VII: facial muscle function - upper  Normal  VII: facial muscle function - lower Normal  VIII: hearing Not tested  IX: soft palate elevation  Normal  IX,X: gag reflex Present  XI:  trapezius strength  5/5  XI: sternocleidomastoid strength 5/5  XI: neck flexion strength  5/5  XII: tongue strength  Normal    Data Review Lab Results  Component Value Date   WBC 5.2 11/14/2022   HGB 12.1 (L) 11/14/2022   HCT 36.2 (L) 11/14/2022   MCV 101.1 (H) 11/14/2022   PLT 138 (L) 11/14/2022   Lab Results  Component Value Date   NA 137 11/14/2022   K 4.3 11/14/2022   CL 105 11/14/2022   CO2 24 11/14/2022   BUN 23 11/14/2022   CREATININE 1.81 (H) 11/14/2022   GLUCOSE 97 11/14/2022   Lab Results  Component Value Date   INR 1.1 11/14/2022    Assessment/Plan:  Estimated body mass index is 19.58 kg/m as calculated from the following:   Height as of this encounter: 5\' 7"  (1.702  m).   Weight as of this encounter: 56.7 kg. Patient admitted for PLIF L3-4. Patient has failed a reasonable attempt at conservative therapy.  I explained the condition and procedure to the patient and answered any questions.  Patient wishes to proceed with procedure as planned. Understands risks/ benefits and typical outcomes of procedure.   Tia Alert 11/21/2022 9:36 AM

## 2022-11-22 DIAGNOSIS — M48061 Spinal stenosis, lumbar region without neurogenic claudication: Secondary | ICD-10-CM | POA: Diagnosis not present

## 2022-11-22 DIAGNOSIS — Z85828 Personal history of other malignant neoplasm of skin: Secondary | ICD-10-CM | POA: Diagnosis not present

## 2022-11-22 DIAGNOSIS — M5186 Other intervertebral disc disorders, lumbar region: Secondary | ICD-10-CM | POA: Diagnosis not present

## 2022-11-22 DIAGNOSIS — Z21 Asymptomatic human immunodeficiency virus [HIV] infection status: Secondary | ICD-10-CM | POA: Diagnosis not present

## 2022-11-22 DIAGNOSIS — Z79899 Other long term (current) drug therapy: Secondary | ICD-10-CM | POA: Diagnosis not present

## 2022-11-22 DIAGNOSIS — I129 Hypertensive chronic kidney disease with stage 1 through stage 4 chronic kidney disease, or unspecified chronic kidney disease: Secondary | ICD-10-CM | POA: Diagnosis not present

## 2022-11-22 DIAGNOSIS — M532X6 Spinal instabilities, lumbar region: Secondary | ICD-10-CM | POA: Diagnosis not present

## 2022-11-22 DIAGNOSIS — Z951 Presence of aortocoronary bypass graft: Secondary | ICD-10-CM | POA: Diagnosis not present

## 2022-11-22 DIAGNOSIS — N183 Chronic kidney disease, stage 3 unspecified: Secondary | ICD-10-CM | POA: Diagnosis not present

## 2022-11-22 MED ORDER — OXYCODONE-ACETAMINOPHEN 5-325 MG PO TABS: 1.0000 | ORAL_TABLET | ORAL | 0 refills | Status: AC | PRN

## 2022-11-22 NOTE — Progress Notes (Signed)
Patient alert and oriented, void, ambulate, surgical site clean and dry no sign of infection. D/c instructions explain and given all questions answered. Pt. d/c home per order.

## 2022-11-22 NOTE — Evaluation (Signed)
Physical Therapy Evaluation Patient Details Name: [name] MRN: [id number] DOB: 12-19-1945 Today's Date: 11/22/2022  History of Present Illness  77 y.o. male admitted 9/13 for back and leg pain from recurrent stenosis of L3-L4. He is now s/p PLIF L3-L4. PMH - arthritis, HIV, CABG, CKD, HTN, hx of Lumbar Laminectomy/decompression.   Clinical Impression  Pt in bed upon arrival of PT, agreeable to evaluation at this time. Prior to admission the pt was completely independent without use of DME, walking 3 miles/day and living with his significant other in a home with 2 stairs to enter. The pt was able to complete all transfers without assistance and with good stability, as well as hallway ambulation and stair navigation without need for physical assistance. He expressed understanding of all education, was able to demo picking up item from the floor without breaking spinal precautions. Pt hopeful to start OPPT when cleared by MD, no immediate PT needs. Pt safe to return home with family support when medically cleared.      If plan is discharge home, recommend the following: Help with stairs or ramp for entrance   Can travel by private vehicle    yes    Equipment Recommendations None recommended by PT  Recommendations for Other Services       Functional Status Assessment Patient has had a recent decline in their functional status and demonstrates the ability to make significant improvements in function in a reasonable and predictable amount of time.     Precautions / Restrictions Precautions Precautions: Back Precaution Booklet Issued: Yes (comment) Required Braces or Orthoses: Spinal Brace Spinal Brace: Lumbar corset;Applied in sitting position Restrictions Weight Bearing Restrictions: No      Mobility  Bed Mobility Overal bed mobility: Modified Independent             General bed mobility comments: pt sitting EOB upon arrival, discussed log roll     Transfers Overall transfer level: Needs assistance Equipment used: None Transfers: Sit to/from Stand Sit to Stand: Supervision           General transfer comment: no assist or instability    Ambulation/Gait Ambulation/Gait assistance: Supervision Gait Distance (Feet): 300 Feet Assistive device: None Gait Pattern/deviations: Step-through pattern, Antalgic Gait velocity: decreased but functional     General Gait Details: slightly antalgic gait but no buckling, toe drag, or LOB. good speed.  Stairs Stairs: Yes Stairs assistance: Contact guard assist Stair Management: One rail Right, Step to pattern Number of Stairs: 2 General stair comments: single UE support, discused up with LLE and down with RLE     Balance Overall balance assessment: Needs assistance Sitting-balance support: No upper extremity supported, Feet supported Sitting balance-Leahy Scale: Good     Standing balance support: No upper extremity supported, During functional activity Standing balance-Leahy Scale: Good Standing balance comment: able to knee to ground and rise with UE assist but no physical assist. able to complete static single-leg stance with either leg for 2-3 seconds                             Pertinent Vitals/Pain Pain Assessment Pain Assessment: 0-10 Pain Score: 2  Pain Location: incision Pain Descriptors / Indicators: Discomfort Pain Intervention(s): Limited activity within patient's tolerance, Monitored during session    Home Living Family/patient expects to be discharged to:: Private residence Living Arrangements: Spouse/significant other Available Help at Discharge: Family;Available 24 hours/day Type of Home: House Home Access: Stairs  to enter Entrance Stairs-Rails: None Entrance Stairs-Number of Steps: 2 Alternate Level Stairs-Number of Steps: 20 Home Layout: Multi-level;Able to live on main level with bedroom/bathroom Home Equipment: Rolling Walker (2  wheels);Grab bars - tub/shower      Prior Function Prior Level of Function : Independent/Modified Independent;Driving             Mobility Comments: independent without use of DME, reports walking 3 miles per day ADLs Comments: independent     Extremity/Trunk Assessment   Upper Extremity Assessment Upper Extremity Assessment: Defer to OT evaluation    Lower Extremity Assessment Lower Extremity Assessment: RLE deficits/detail RLE Deficits / Details: grossly 4+/5 at ankle and hip, 4-/5 at knee. reports sensation intact RLE Sensation: WNL RLE Coordination: WNL    Cervical / Trunk Assessment Cervical / Trunk Assessment: Back Surgery  Communication   Communication Communication: No apparent difficulties Cueing Techniques: Verbal cues  Cognition Arousal: Alert Behavior During Therapy: WFL for tasks assessed/performed Overall Cognitive Status: Within Functional Limits for tasks assessed                                          General Comments General comments (skin integrity, edema, etc.): VSS on RA    Exercises     Assessment/Plan    PT Assessment Patient does not need any further PT services         PT Goals (Current goals can be found in the Care Plan section)  Acute Rehab PT Goals Patient Stated Goal: return to walking and exercise PT Goal Formulation: All assessment and education complete, DC therapy Time For Goal Achievement: 11/29/22 Potential to Achieve Goals: Good     AM-PAC PT "6 Clicks" Mobility  Outcome Measure Help needed turning from your back to your side while in a flat bed without using bedrails?: A Little Help needed moving from lying on your back to sitting on the side of a flat bed without using bedrails?: A Little Help needed moving to and from a bed to a chair (including a wheelchair)?: None Help needed standing up from a chair using your arms (e.g., wheelchair or bedside chair)?: None Help needed to walk in hospital  room?: None Help needed climbing 3-5 steps with a railing? : A Little 6 Click Score: 21    End of Session Equipment Utilized During Treatment: Gait belt;Back brace Activity Tolerance: Patient tolerated treatment well Patient left:  (standing in room with OT) Nurse Communication: Mobility status PT Visit Diagnosis: Unsteadiness on feet (R26.81);Pain Pain - Right/Left: Right Pain - part of body: Leg    Time: 8657-8469 PT Time Calculation (min) (ACUTE ONLY): 23 min   Charges:   PT Evaluation $PT Eval Low Complexity: 1 Low PT Treatments $Gait Training: 8-22 mins PT General Charges $$ ACUTE PT VISIT: 1 Visit         Vickki Muff, PT, DPT   Acute Rehabilitation Department Office [phone] Secure Chat Communication Preferred  Ronnie Derby 11/22/2022, 8:58 AM

## 2022-11-22 NOTE — Plan of Care (Signed)
  Problem: Education: °Goal: Ability to verbalize activity precautions or restrictions will improve °Outcome: Completed/Met °Goal: Knowledge of the prescribed therapeutic regimen will improve °Outcome: Completed/Met °Goal: Understanding of discharge needs will improve °Outcome: Completed/Met °  °Problem: Activity: °Goal: Ability to avoid complications of mobility impairment will improve °Outcome: Completed/Met °Goal: Ability to tolerate increased activity will improve °Outcome: Completed/Met °Goal: Will remain free from falls °Outcome: Completed/Met °  °Problem: Bowel/Gastric: °Goal: Gastrointestinal status for postoperative course will improve °Outcome: Completed/Met °  °Problem: Clinical Measurements: °Goal: Ability to maintain clinical measurements within normal limits will improve °Outcome: Completed/Met °Goal: Postoperative complications will be avoided or minimized °Outcome: Completed/Met °Goal: Diagnostic test results will improve °Outcome: Completed/Met °  °Problem: Pain Management: °Goal: Pain level will decrease °Outcome: Completed/Met °  °Problem: Skin Integrity: °Goal: Will show signs of wound healing °Outcome: Completed/Met °  °Problem: Health Behavior/Discharge Planning: °Goal: Identification of resources available to assist in meeting health care needs will improve °Outcome: Completed/Met °  °Problem: Bladder/Genitourinary: °Goal: Urinary functional status for postoperative course will improve °Outcome: Completed/Met °  °

## 2022-11-22 NOTE — Care Management (Signed)
Patient with order to DC to home today. Unit staff to provide DME needed for home.   No HH needs identified Patient will have family/ friends provide transportation home. No other TOC needs identified for DC 

## 2022-11-22 NOTE — Discharge Summary (Signed)
Patient ID: [name] MRN: [id number] DOB/AGE: [birth year] 77 y.o.  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 male who was admitted following an uncomplicated L3-4 PLIF. They were recovered in PACU and transferred to [facility]. Hospital course was uncomplicated. Pt stable for discharge today. Pt to f/u in office for routine post op visit. Pt is in agreement w/ plan.    Discharge Exam: Blood pressure (!) 148/70, pulse 64, temperature 98.5 F (36.9 C), temperature source Oral, resp. rate 20, height 5\' 7"  (1.702 m), weight 56.7 kg, SpO2 98%. A&O x3 Speech fluent, appropriate Strength 5/5 x4.  SILTx4.  Dressing c/d/I.   Disposition: Discharge disposition: 01-Home or Self Care        Allergies as of 11/22/2022   No Known Allergies      Medication List     TAKE these medications    Belsomra 10 MG Tabs Generic drug: Suvorexant Take 1 tablet (10 mg total) by mouth at bedtime as needed.   Dovato 50-300 MG tablet Generic drug: dolutegravir-lamiVUDine Take 1 tablet by mouth daily.   ezetimibe 10 MG tablet Commonly known as: ZETIA Take 1 tablet (10 mg total) by mouth daily.   losartan-hydrochlorothiazide 50-12.5 MG tablet Commonly known as: HYZAAR Take 1 tablet by mouth daily.   nitroGLYCERIN 0.3 MG SL tablet Commonly known as: NITROSTAT Place 1 tablet (0.3 mg total) under the tongue every 5 (five) minutes as needed for chest pain.   oxyCODONE-acetaminophen 5-325 MG tablet Commonly known as: Percocet Take 1 tablet by mouth every 4 (four) hours as needed for moderate pain. What changed:  when to take this reasons  to take this   rosuvastatin 20 MG tablet Commonly known as: CRESTOR Take 1 tablet (20 mg total) by mouth daily.   tadalafil 5 MG tablet Commonly known as: CIALIS TAKE 1 TABLET BY MOUTH DAILY AS NEEDED FOR ERECTILE DYSFUNCTION.               Durable Medical Equipment  (From admission, onward)           Start     Ordered   11/21/22 1338  DME Walker rolling  Once       Question:  Patient needs a walker to treat with the following condition  Answer:  S/P lumbar fusion   11/21/22 1337   11/21/22 1338  DME 3 n 1  Once        11/21/22 1337            Follow-up Information     Arman Bogus, MD. Call.   Specialty: Neurosurgery Why: As needed, If symptoms worsen Contact information: [address] [place] [address] [phone]                 Signed: Clovis Riley 11/22/2022, 8:34 AM

## 2022-11-22 NOTE — Evaluation (Signed)
Occupational Therapy Evaluation Patient Details Name: [name] MRN: [id number] DOB: February 14, 1946 Today's Date: 11/22/2022   History of Present Illness 77 y.o. male admitted 9/13 for back and leg pain from recurrent stenosis of L3-L4. He is now s/p PLIF L3-L4. PMH - arthritis, HIV, CABG, CKD, HTN, hx of Lumbar Laminectomy/decompression.   Clinical Impression   Pt admitted for above, presents close to baseline and reports getting dressed independently before OT arrival. Pt displays good knowledge of POB precautions and use of problem solving to find compensatory strategies. Discussed with pt options for better accessibility of home environment and safe car transfers, he verbalized and demonstrated understanding. Pt has no further acute skilled OT needs, no follow up OT needed.        If plan is discharge home, recommend the following: Assist for transportation    Functional Status Assessment  Patient has not had a recent decline in their functional status  Equipment Recommendations  None recommended by OT (pt has rec DME)    Recommendations for Other Services       Precautions / Restrictions Precautions Precautions: Back Precaution Booklet Issued: Yes (comment) (from PT) Required Braces or Orthoses: Spinal Brace Spinal Brace: Lumbar corset;Applied in sitting position Restrictions Weight Bearing Restrictions: No      Mobility Bed Mobility               General bed mobility comments: OOB upon arrival    Transfers Overall transfer level: Needs assistance Equipment used: None Transfers: Sit to/from Stand Sit to Stand: Supervision                  Balance Overall balance assessment: Mild deficits observed, not formally tested                                         ADL either performed or assessed with clinical judgement   ADL Overall ADL's : Modified independent                                       General  ADL Comments: Pt ambulated in hall with PT, seemingly supervision level no AD, already dressed upon OT arrival. Supervision provided for room ambulation. Educated pt on proper car transfer mechanincs with two trials for carryover, discussed with pt home setup adjustments and bringing necessities within arms reach or using a step stool to avoid excessive overhead reaching. Also discussed compensatory strategy for oral hygiene by bringing cup to mouth for rinsing     Vision         Perception         Praxis         Pertinent Vitals/Pain Pain Assessment Pain Assessment: 0-10 Pain Score: 2  Pain Location: incision Pain Descriptors / Indicators: Discomfort Pain Intervention(s): Limited activity within patient's tolerance, Monitored during session     Extremity/Trunk Assessment Upper Extremity Assessment Upper Extremity Assessment: Overall WFL for tasks assessed   Lower Extremity Assessment Lower Extremity Assessment: RLE deficits/detail RLE Deficits / Details: grossly 4+/5 at ankle and hip, 4-/5 at knee. reports sensation intact RLE Sensation: WNL RLE Coordination: WNL   Cervical / Trunk Assessment Cervical / Trunk Assessment: Back Surgery   Communication Communication Communication: No apparent difficulties Cueing Techniques: Verbal cues   Cognition Arousal: Alert Behavior During  Therapy: WFL for tasks assessed/performed Overall Cognitive Status: Within Functional Limits for tasks assessed                                       General Comments  VSS on RA    Exercises     Shoulder Instructions      Home Living Family/patient expects to be discharged to:: Private residence Living Arrangements: Spouse/significant other Available Help at Discharge: Family;Available 24 hours/day Type of Home: House Home Access: Stairs to enter [employer] of Steps: 2 Entrance Stairs-Rails: None Home Layout: Multi-level;Able to live on main level with  bedroom/bathroom Alternate Level Stairs-Number of Steps: 20 Alternate Level Stairs-Rails: Right;Left Bathroom Shower/Tub: Producer, television/film/video: Handicapped height Bathroom Accessibility: Yes   Home Equipment: Agricultural consultant (2 wheels);Grab bars - tub/shower          Prior Functioning/Environment Prior Level of Function : Independent/Modified Independent;Driving             Mobility Comments: independent without use of DME, reports walking 3 miles per day ADLs Comments: independent        OT Problem List: Pain      OT Treatment/Interventions:      OT Goals(Current goals can be found in the care plan section) Acute Rehab OT Goals Patient Stated Goal: To go home OT Goal Formulation: With patient Time For Goal Achievement: 12/06/22 Potential to Achieve Goals: Good  OT Frequency:      Co-evaluation              AM-PAC OT "6 Clicks" Daily Activity     Outcome Measure Help from another person eating meals?: None Help from another person taking care of personal grooming?: None Help from another person toileting, which includes using toliet, bedpan, or urinal?: None Help from another person bathing (including washing, rinsing, drying)?: None Help from another person to put on and taking off regular upper body clothing?: None Help from another person to put on and taking off regular lower body clothing?: None 6 Click Score: 24   End of Session Equipment Utilized During Treatment: Back brace Nurse Communication: Mobility status  Activity Tolerance: Patient tolerated treatment well;No increased pain Patient left: in bed;with call bell/phone within reach  OT Visit Diagnosis: Pain Pain - part of body:  (back)                Time: 4098-1191 OT Time Calculation (min): 11 min Charges:  OT General Charges $OT Visit: 1 Visit OT Evaluation $OT Eval Low Complexity: 1 Low  11/22/2022  AB, OTR/L  Acute Rehabilitation Services  Office: [phone]    Tristan Schroeder 11/22/2022, 9:14 AM

## 2022-11-28 ENCOUNTER — Telehealth: Payer: Self-pay | Admitting: Family Medicine

## 2022-11-28 NOTE — Telephone Encounter (Signed)
P.A. TADALFIL

## 2022-12-15 NOTE — Telephone Encounter (Signed)
Pt still does not qualify for ED meds with Medicare, pt uses [facility]

## 2023-01-05 DIAGNOSIS — D485 Neoplasm of uncertain behavior of skin: Secondary | ICD-10-CM | POA: Diagnosis not present

## 2023-01-05 DIAGNOSIS — D0439 Carcinoma in situ of skin of other parts of face: Secondary | ICD-10-CM | POA: Diagnosis not present

## 2023-01-06 DIAGNOSIS — M48062 Spinal stenosis, lumbar region with neurogenic claudication: Secondary | ICD-10-CM | POA: Diagnosis not present

## 2023-01-06 DIAGNOSIS — Z681 Body mass index (BMI) 19 or less, adult: Secondary | ICD-10-CM | POA: Diagnosis not present

## 2023-02-12 ENCOUNTER — Telehealth: Payer: Self-pay

## 2023-02-12 ENCOUNTER — Other Ambulatory Visit: Payer: Self-pay | Admitting: Family Medicine

## 2023-02-12 DIAGNOSIS — G479 Sleep disorder, unspecified: Secondary | ICD-10-CM

## 2023-02-12 DIAGNOSIS — E785 Hyperlipidemia, unspecified: Secondary | ICD-10-CM

## 2023-02-12 NOTE — Telephone Encounter (Signed)
Pt states he is under a lot of stress and is having troubles sleeping. Would like a refill of [facility] to [facility] [address]. Last appt. 10/07/22. Next appt. 05/28/23.    Also since back surgery in September, he's had leg cramps. States he has to get up 2-3x a night to stretch. Please advise.

## 2023-02-13 MED ORDER — BELSOMRA 10 MG PO TABS
1.0000 | ORAL_TABLET | Freq: Every evening | ORAL | 1 refills | Status: DC | PRN
Start: 2023-02-13 — End: 2023-10-20

## 2023-03-20 DIAGNOSIS — H52223 Regular astigmatism, bilateral: Secondary | ICD-10-CM | POA: Diagnosis not present

## 2023-03-20 DIAGNOSIS — H43392 Other vitreous opacities, left eye: Secondary | ICD-10-CM | POA: Diagnosis not present

## 2023-03-20 DIAGNOSIS — H43813 Vitreous degeneration, bilateral: Secondary | ICD-10-CM | POA: Diagnosis not present

## 2023-03-20 DIAGNOSIS — H524 Presbyopia: Secondary | ICD-10-CM | POA: Diagnosis not present

## 2023-03-20 DIAGNOSIS — H2513 Age-related nuclear cataract, bilateral: Secondary | ICD-10-CM | POA: Diagnosis not present

## 2023-03-20 DIAGNOSIS — H5203 Hypermetropia, bilateral: Secondary | ICD-10-CM | POA: Diagnosis not present

## 2023-04-09 DIAGNOSIS — M48062 Spinal stenosis, lumbar region with neurogenic claudication: Secondary | ICD-10-CM | POA: Diagnosis not present

## 2023-04-20 ENCOUNTER — Other Ambulatory Visit: Payer: Self-pay | Admitting: Family Medicine

## 2023-04-20 DIAGNOSIS — Z21 Asymptomatic human immunodeficiency virus [HIV] infection status: Secondary | ICD-10-CM

## 2023-04-20 DIAGNOSIS — E785 Hyperlipidemia, unspecified: Secondary | ICD-10-CM

## 2023-05-05 ENCOUNTER — Encounter: Payer: Self-pay | Admitting: Internal Medicine

## 2023-05-19 DIAGNOSIS — L821 Other seborrheic keratosis: Secondary | ICD-10-CM | POA: Diagnosis not present

## 2023-05-19 DIAGNOSIS — D485 Neoplasm of uncertain behavior of skin: Secondary | ICD-10-CM | POA: Diagnosis not present

## 2023-05-19 DIAGNOSIS — L570 Actinic keratosis: Secondary | ICD-10-CM | POA: Diagnosis not present

## 2023-05-28 ENCOUNTER — Ambulatory Visit: Payer: Medicare PPO | Admitting: Family Medicine

## 2023-05-28 VITALS — BP 140/82 | HR 60 | Ht 66.75 in | Wt 126.4 lb

## 2023-05-28 DIAGNOSIS — I2510 Atherosclerotic heart disease of native coronary artery without angina pectoris: Secondary | ICD-10-CM | POA: Diagnosis not present

## 2023-05-28 DIAGNOSIS — E782 Mixed hyperlipidemia: Secondary | ICD-10-CM | POA: Diagnosis not present

## 2023-05-28 DIAGNOSIS — I77811 Abdominal aortic ectasia: Secondary | ICD-10-CM

## 2023-05-28 DIAGNOSIS — Z21 Asymptomatic human immunodeficiency virus [HIV] infection status: Secondary | ICD-10-CM | POA: Diagnosis not present

## 2023-05-28 DIAGNOSIS — E785 Hyperlipidemia, unspecified: Secondary | ICD-10-CM

## 2023-05-28 DIAGNOSIS — J301 Allergic rhinitis due to pollen: Secondary | ICD-10-CM

## 2023-05-28 DIAGNOSIS — N522 Drug-induced erectile dysfunction: Secondary | ICD-10-CM | POA: Diagnosis not present

## 2023-05-28 DIAGNOSIS — I10 Essential (primary) hypertension: Secondary | ICD-10-CM

## 2023-05-28 DIAGNOSIS — M47816 Spondylosis without myelopathy or radiculopathy, lumbar region: Secondary | ICD-10-CM

## 2023-05-28 DIAGNOSIS — N1831 Chronic kidney disease, stage 3a: Secondary | ICD-10-CM | POA: Diagnosis not present

## 2023-05-28 DIAGNOSIS — Z79899 Other long term (current) drug therapy: Secondary | ICD-10-CM | POA: Diagnosis not present

## 2023-05-28 DIAGNOSIS — M48062 Spinal stenosis, lumbar region with neurogenic claudication: Secondary | ICD-10-CM

## 2023-05-28 DIAGNOSIS — K219 Gastro-esophageal reflux disease without esophagitis: Secondary | ICD-10-CM | POA: Diagnosis not present

## 2023-05-28 DIAGNOSIS — Z23 Encounter for immunization: Secondary | ICD-10-CM

## 2023-05-28 DIAGNOSIS — Z9889 Other specified postprocedural states: Secondary | ICD-10-CM

## 2023-05-28 DIAGNOSIS — G479 Sleep disorder, unspecified: Secondary | ICD-10-CM

## 2023-05-28 DIAGNOSIS — I700 Atherosclerosis of aorta: Secondary | ICD-10-CM

## 2023-05-28 MED ORDER — ROSUVASTATIN CALCIUM 20 MG PO TABS
20.0000 mg | ORAL_TABLET | Freq: Every day | ORAL | 0 refills | Status: DC
Start: 2023-05-28 — End: 2023-10-16

## 2023-05-28 MED ORDER — CELECOXIB 200 MG PO CAPS: 200.0000 mg | ORAL_CAPSULE | Freq: Two times a day (BID) | ORAL | 3 refills | Status: AC

## 2023-05-28 NOTE — Progress Notes (Unsigned)
 [name] is a 78 y.o. male who presents for annual wellness visit and follow-up on chronic medical conditions.  He has no particular concerns or complaints.  He continues on Dovato and is having no difficulty with that.  He is also taking Crestor and Zetia with no aches or pains.  He uses Cialis on an as-needed basis.  Also has some Belsomra when he needs for sleep.  Continues on losartan/HCTZ.  He does have underlying allergies and they are under good control.  He has had no chest pain, shortness of breath.  His home life is stable. He does have chronic back issues and will be discussing further pain management with his specialists especially with the new medication coming out that might be very helpful for him.  He is not having any difficulty with reflux.  Immunizations and Health Maintenance Immunization History  Administered Date(s) Administered   Fluad Quad(high Dose 65+) 11/29/2018, 12/20/2019, 12/28/2020, 04/01/2022   Influenza Split 01/15/2011, 03/15/2012   Influenza Whole 12/28/2006, 01/11/2009   Influenza, High Dose Seasonal PF 02/10/2013, 03/29/2014, 03/21/2015, 04/03/2016, 03/24/2017   PFIZER(Purple Top)SARS-COV-2 Vaccination 04/17/2019, 05/11/2019, 12/20/2019   Pfizer Covid-19 Vaccine Bivalent Booster 61yrs & up 03/07/2021   Pneumococcal Conjugate-13 02/12/2005   Pneumococcal Polysaccharide-23 03/24/2013   Tdap 03/15/2012   Unspecified SARS-COV-2 Vaccination 12/09/2021   Zoster Recombinant(Shingrix) 01/09/2019, 07/11/2019   Health Maintenance Due  Topic Date Due   DTaP/Tdap/Td (2 - Td or Tdap) 03/15/2022   COVID-19 Vaccine (6 - 2024-25 season) 11/09/2022   Medicare Annual Wellness (AWV)  03/26/2023    Last colonoscopy: 2020 Last PSA: Dentist: Dr. Clelia Croft Ophtho: [facility] Exercise: 2-4 miles of walking,   Other doctors caring for patient include:Jones Merry Proud  Advanced Directives: No copy asked for Does Patient Have a Medical Advance  Directive?: No Would patient like information on creating a medical advance directive?: No - Patient declined  Depression screen:  See questionnaire below.        05/28/2023    8:59 AM 03/25/2022    2:06 PM 03/22/2021    8:19 AM 03/13/2020    8:49 AM 12/20/2019    3:44 PM  Depression screen PHQ 2/9  Decreased Interest 0 0 0 0 0  Down, Depressed, Hopeless 0 0 0 0 0  PHQ - 2 Score 0 0 0 0 0  Altered sleeping  0     Tired, decreased energy  0     Change in appetite  0     Feeling bad or failure about yourself   0     Trouble concentrating  0     Moving slowly or fidgety/restless  0     Suicidal thoughts  0     PHQ-9 Score  0     Difficult doing work/chores  Not difficult at all       Fall Screen: See Questionaire below.      05/28/2023    8:59 AM 03/25/2022    2:05 PM 03/22/2021    8:19 AM 12/20/2019    3:45 PM 09/16/2018    8:43 AM  Fall Risk   Falls in the past year? 0 0 0 0 0  Number falls in past yr: 0 0     Injury with Fall? 0 0     Risk for fall due to : No Fall Risks Medication side effect Impaired balance/gait;Medication side effect    Follow up Falls evaluation completed Falls prevention discussed;Education provided;Falls evaluation completed Falls  evaluation completed;Education provided;Falls prevention discussed      ADL screen:  See questionnaire below.  Functional Status Survey: Is the patient deaf or have difficulty hearing?: Yes (does have hearing aids where them when he goes out to eat) Does the patient have difficulty seeing, even when wearing glasses/contacts?: No Does the patient have difficulty concentrating, remembering, or making decisions?: No Does the patient have difficulty walking or climbing stairs?: No Does the patient have difficulty dressing or bathing?: No Does the patient have difficulty doing errands alone such as visiting a doctor's office or shopping?: No   Review of Systems  Constitutional: -, -unexpected weight change, -anorexia,  -fatigue Allergy: -sneezing, -itching, -congestion Dermatology: denies changing moles, rash, lumps ENT: -runny nose, -ear pain, -sore throat,  Cardiology:  -chest pain, -palpitations, -orthopnea, Respiratory: -cough, -shortness of breath, -dyspnea on exertion, -wheezing,  Gastroenterology: -abdominal pain, -nausea, -vomiting, -diarrhea, -constipation, -dysphagia Hematology: -bleeding or bruising problems Musculoskeletal: -arthralgias, -myalgias, -joint swelling, -back pain, - Ophthalmology: -vision changes,  Urology: -dysuria, -difficulty urinating,  -urinary frequency, -urgency, incontinence Neurology: -, -numbness, , -memory loss, -falls, -dizziness    PHYSICAL EXAM:  BP (!) 140/82   Pulse 60   Ht 5' 6.75" (1.695 m)   Wt 126 lb 6.4 oz (57.3 kg)   BMI 19.95 kg/m   General Appearance: Alert, cooperative, no distress, appears stated age Head: Normocephalic, without obvious abnormality, atraumatic Eyes: PERRL, conjunctiva/corneas clear, EOM's intact,  Ears: Normal TM's and external ear canals Nose: Nares normal, mucosa normal, no drainage or sinus   tenderness Throat: Lips, mucosa, and tongue normal; teeth and gums normal Neck: Supple, no lymphadenopathy, thyroid:no enlargement/tenderness/nodules; no carotid bruit or JVD Lungs: Clear to auscultation bilaterally without wheezes, rales or ronchi; respirations unlabored Heart: Regular rate and rhythm, S1 and S2 normal, no murmur, rub or gallop Abdomen: Soft, non-tender, nondistended, normoactive bowel sounds, no masses, no hepatosplenomegaly Extremities: No clubbing, cyanosis or edema Ulnar deviation noted of of hands. Pulses: 2+ and symmetric all extremities Skin: Skin color, texture, turgor normal, no rashes or lesions Lymph nodes: Cervical, supraclavicular, and axillary nodes normal Neurologic: CNII-XII intact, normal strength, sensation and gait; reflexes 2+ and symmetric throughout   Psych: Normal mood, affect, hygiene and  grooming  ASSESSMENT/PLAN: ASHD (arteriosclerotic heart disease) - Plan: CBC with Differential/Platelet, Comprehensive metabolic panel  Non-seasonal allergic rhinitis due to pollen  Atherosclerosis of aorta (HCC) - Plan: Lipid panel  Drug-induced erectile dysfunction  Ectatic abdominal aorta (HCC)  Encounter for long-term (current) use of high-risk medication - Plan: CBC with Differential/Platelet, Comprehensive metabolic panel, HIV-1 RNA quant-no reflex-bld, Lipid panel, T-helper cells (CD4) count (not at [facility])  Gastroesophageal reflux disease without esophagitis  HIV positive (HCC) - Plan: CBC with Differential/Platelet, Comprehensive metabolic panel, HIV-1 RNA quant-no reflex-bld, Lipid panel, T-helper cells (CD4) count (not at [facility])  Primary hypertension  Mixed hyperlipidemia - Plan: Lipid panel  Spinal stenosis of lumbar region with neurogenic claudication  Sleep disturbance  Stage 3a chronic kidney disease (HCC) - Plan: Comprehensive metabolic panel  Spondylosis without myelopathy or radiculopathy, lumbar region  Need for vaccination against Streptococcus pneumoniae - Plan: Pneumococcal conjugate vaccine 20-valent (Prevnar 20)  Hyperlipidemia, unspecified hyperlipidemia type - Plan: rosuvastatin (CRESTOR) 20 MG tablet  History of lumbar laminectomy    Discussed . Immunization recommendations and recommend getting Tdap and he states that he did get the RSV.  Continue on present medication regimen.  Follow-up here in roughly 6 months..  Colonoscopy recommendations reviewed.   Medicare Attestation I have  personally reviewed: The patient's medical and social history Their use of alcohol, tobacco or illicit drugs Their current medications and supplements The patient's functional ability including ADLs,fall risks, home safety risks, cognitive, and hearing and visual impairment Diet and physical activities Evidence for depression or mood disorders  The patient's  weight, height, and BMI have been recorded in the chart.  I have made referrals, counseling, and provided education to the patient based on review of the above and I have provided the patient with a written personalized care plan for preventive services.     Sharlot Gowda, MD   05/28/2023

## 2023-05-29 LAB — LIPID PANEL
Chol/HDL Ratio: 2.6 ratio (ref 0.0–5.0)
Cholesterol, Total: 108 mg/dL (ref 100–199)
HDL: 42 mg/dL (ref >39)
LDL Chol Calc (NIH): 50 mg/dL (ref 0–99)
Triglycerides: 77 mg/dL (ref 0–149)
VLDL Cholesterol Cal: 16 mg/dL (ref 5–40)

## 2023-05-29 LAB — COMPREHENSIVE METABOLIC PANEL
ALT: 17 IU/L (ref 0–44)
AST: 22 IU/L (ref 0–40)
Albumin: 4.7 g/dL (ref 3.8–4.8)
Alkaline Phosphatase: 86 IU/L (ref 44–121)
BUN/Creatinine Ratio: 13 (ref 10–24)
BUN: 27 mg/dL (ref 8–27)
Bilirubin Total: 0.6 mg/dL (ref 0.0–1.2)
CO2: 20 mmol/L (ref 20–29)
Calcium: 9.8 mg/dL (ref 8.6–10.2)
Chloride: 103 mmol/L (ref 96–106)
Creatinine, Ser: 2.02 mg/dL — ABNORMAL HIGH (ref 0.76–1.27)
Globulin, Total: 2.2 g/dL (ref 1.5–4.5)
Glucose: 88 mg/dL (ref 70–99)
Potassium: 4.5 mmol/L (ref 3.5–5.2)
Sodium: 137 mmol/L (ref 134–144)
Total Protein: 6.9 g/dL (ref 6.0–8.5)
eGFR: 33 mL/min/{1.73_m2} — ABNORMAL LOW (ref >59)

## 2023-05-29 LAB — T-HELPER CELLS (CD4) COUNT (NOT AT ARMC)
% CD 4 Pos. Lymph.: 44 % (ref 30.8–58.5)
Absolute CD 4 Helper: 352 /uL — ABNORMAL LOW (ref 359–1519)
Basophils Absolute: 0 10*3/uL (ref 0.0–0.2)
Basos: 0 %
EOS (ABSOLUTE): 0.1 10*3/uL (ref 0.0–0.4)
Eos: 1 %
Hematocrit: 39.4 % (ref 37.5–51.0)
Hemoglobin: 13.6 g/dL (ref 13.0–17.7)
Immature Grans (Abs): 0 10*3/uL (ref 0.0–0.1)
Immature Granulocytes: 0 %
Lymphocytes Absolute: 0.8 10*3/uL (ref 0.7–3.1)
Lymphs: 18 %
MCH: 33.2 pg — ABNORMAL HIGH (ref 26.6–33.0)
MCHC: 34.5 g/dL (ref 31.5–35.7)
MCV: 96 fL (ref 79–97)
Monocytes Absolute: 0.3 10*3/uL (ref 0.1–0.9)
Monocytes: 6 %
Neutrophils Absolute: 3.3 10*3/uL (ref 1.4–7.0)
Neutrophils: 75 %
Platelets: 116 10*3/uL — ABNORMAL LOW (ref 150–450)
RBC: 4.1 x10E6/uL — ABNORMAL LOW (ref 4.14–5.80)
RDW: 13.7 % (ref 11.6–15.4)
WBC: 4.4 10*3/uL (ref 3.4–10.8)

## 2023-05-29 LAB — HIV-1 RNA QUANT-NO REFLEX-BLD

## 2023-05-30 ENCOUNTER — Encounter: Payer: Self-pay | Admitting: Family Medicine

## 2023-06-12 ENCOUNTER — Other Ambulatory Visit: Payer: Self-pay | Admitting: Family Medicine

## 2023-06-12 DIAGNOSIS — E785 Hyperlipidemia, unspecified: Secondary | ICD-10-CM

## 2023-07-21 ENCOUNTER — Other Ambulatory Visit: Payer: Self-pay | Admitting: Family Medicine

## 2023-07-21 ENCOUNTER — Telehealth: Payer: Self-pay

## 2023-07-21 DIAGNOSIS — N521 Erectile dysfunction due to diseases classified elsewhere: Secondary | ICD-10-CM

## 2023-07-21 NOTE — Telephone Encounter (Signed)
 I have received a P/a request via the pts pref'd pharmacy for the pts Tadalafil . I have spoke with the pharmacy and informed them to run the Goodrx Coupon card as the pts plan doesn't cover this drug. The cost will be around/about 22.00 for a 30 day supply and is now getting filled. Nothing further needed.

## 2023-07-21 NOTE — Telephone Encounter (Signed)
 Last apt 05/28/23

## 2023-08-12 ENCOUNTER — Telehealth: Payer: Self-pay

## 2023-08-12 NOTE — Telephone Encounter (Signed)
 Copied from CRM [id number]. Topic: Clinical - Medication Question >> Aug 12, 2023  3:24 PM Santiya F wrote: Reason for CRM: Patient is calling in because he had back surgery and he was taking an Opioid for pain but had to discontinue it. Patient says he saw a new medication from [employer] is coming out called Journavx and he wanted to speak with his provider about potentially getting a prescription for this medication. Please follow up with patient.

## 2023-08-13 NOTE — Telephone Encounter (Signed)
 Talked to patient says he will touch base with surgeon tomorrow.

## 2023-08-20 DIAGNOSIS — M48062 Spinal stenosis, lumbar region with neurogenic claudication: Secondary | ICD-10-CM | POA: Diagnosis not present

## 2023-10-13 ENCOUNTER — Encounter: Payer: Self-pay | Admitting: Family Medicine

## 2023-10-13 ENCOUNTER — Ambulatory Visit: Admitting: Family Medicine

## 2023-10-13 VITALS — BP 140/80 | HR 80 | Ht 66.75 in | Wt 126.2 lb

## 2023-10-13 DIAGNOSIS — N1831 Chronic kidney disease, stage 3a: Secondary | ICD-10-CM | POA: Diagnosis not present

## 2023-10-13 DIAGNOSIS — Z981 Arthrodesis status: Secondary | ICD-10-CM

## 2023-10-13 DIAGNOSIS — J301 Allergic rhinitis due to pollen: Secondary | ICD-10-CM

## 2023-10-13 DIAGNOSIS — K219 Gastro-esophageal reflux disease without esophagitis: Secondary | ICD-10-CM | POA: Diagnosis not present

## 2023-10-13 DIAGNOSIS — L4050 Arthropathic psoriasis, unspecified: Secondary | ICD-10-CM | POA: Diagnosis not present

## 2023-10-13 DIAGNOSIS — I2510 Atherosclerotic heart disease of native coronary artery without angina pectoris: Secondary | ICD-10-CM

## 2023-10-13 DIAGNOSIS — I10 Essential (primary) hypertension: Secondary | ICD-10-CM | POA: Diagnosis not present

## 2023-10-13 DIAGNOSIS — Z209 Contact with and (suspected) exposure to unspecified communicable disease: Secondary | ICD-10-CM

## 2023-10-13 DIAGNOSIS — I700 Atherosclerosis of aorta: Secondary | ICD-10-CM | POA: Diagnosis not present

## 2023-10-13 DIAGNOSIS — Z21 Asymptomatic human immunodeficiency virus [HIV] infection status: Secondary | ICD-10-CM | POA: Diagnosis not present

## 2023-10-13 DIAGNOSIS — Z23 Encounter for immunization: Secondary | ICD-10-CM | POA: Diagnosis not present

## 2023-10-13 DIAGNOSIS — Z9889 Other specified postprocedural states: Secondary | ICD-10-CM

## 2023-10-13 DIAGNOSIS — Z0000 Encounter for general adult medical examination without abnormal findings: Secondary | ICD-10-CM | POA: Diagnosis not present

## 2023-10-13 DIAGNOSIS — E782 Mixed hyperlipidemia: Secondary | ICD-10-CM

## 2023-10-13 DIAGNOSIS — Z79899 Other long term (current) drug therapy: Secondary | ICD-10-CM

## 2023-10-13 LAB — LIPID PANEL

## 2023-10-13 NOTE — Progress Notes (Signed)
   Subjective:    Patient ID: [name], male    DOB: 14-Jan-1946, 78 y.o.   MRN: [id number]  HPI He was originally scheduled earlier today but had to be rescheduled for later today due to loss of electricity for several hours.  He would also like to have some STD testing done.  Presently he is having no symptoms.  He follows up regularly with cardiology for his underlying ASHD.  His allergies are under good control.  He does have reflux symptoms and presently is having no difficulty with him.  Continues on his Biktarvy  and is having no difficulty with that.  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  He continues to have difficulty with back and leg pain and presently is using oxycodone .  His neurosurgeon is giving him this.  He states that he takes one quarter of a pill twice per day 3 or 4 times per week for control of his symptoms which seems to be working.  He is interested in one of the new pain medications.   Review of Systems  All other systems reviewed and are negative.      Objective:    Physical Exam Alert and in no distress. Tympanic membranes and canals are normal. Pharyngeal area is normal. Neck is supple without adenopathy or thyromegaly. Cardiac exam shows a regular sinus rhythm without murmurs or gallops. Lungs are clear to auscultation. Ulnar deviation is noted both hands.       Assessment & Plan:  HIV positive (HCC) - Plan: CBC with Differential/Platelet, Comprehensive metabolic panel with GFR, GC/Chlamydia Probe Amp, HIV-1 RNA quant-no reflex-bld, Lipid panel, T-helper cells (CD4) count (not at [facility])  Need for pneumococcal 20-valent conjugate vaccination - Plan: Pneumococcal conjugate vaccine 20-valent (Prevnar 20)  Encounter for long-term (current) use of high-risk medication - Plan: CBC with Differential/Platelet, Comprehensive metabolic panel with GFR, GC/Chlamydia Probe Amp, HIV-1 RNA quant-no reflex-bld, Lipid panel, T-helper  cells (CD4) count (not at [facility])  ASHD (arteriosclerotic heart disease)  Non-seasonal allergic rhinitis due to pollen  Psoriatic arthritis (HCC)  Stage 3a chronic kidney disease (HCC)  Atherosclerosis of aorta (HCC) - Plan: Lipid panel  Gastroesophageal reflux disease without esophagitis  Primary hypertension - Plan: CBC with Differential/Platelet, Comprehensive metabolic panel with GFR  S/P laminectomy  S/P lumbar fusion  Contact with or exposure to communicable disease - Plan: RPR, GC/Chlamydia Probe Amp  I discussed the use of pain medications and the minimal amount of medicine that he is using.  Expressed that I am comfortable with him continuing on this if he is getting control of his symptoms.  He is getting these medications through his neurosurgeon.  He will continue on all of his other medications and we will check him for STDs.

## 2023-10-14 LAB — T-HELPER CELLS (CD4) COUNT (NOT AT ARMC)
% CD 4 Pos. Lymph.: 46.8 (ref 30.8–58.5)
Absolute CD 4 Helper: 374 /uL (ref 359–1519)
Basophils Absolute: 0 x10E3/uL (ref 0.0–0.2)
Basos: 0 %
EOS (ABSOLUTE): 0 x10E3/uL (ref 0.0–0.4)
Eos: 1 %
Hematocrit: 41.3 % (ref 37.5–51.0)
Hemoglobin: 13.6 g/dL (ref 13.0–17.7)
Immature Grans (Abs): 0 x10E3/uL (ref 0.0–0.1)
Immature Granulocytes: 0 %
Lymphocytes Absolute: 0.8 x10E3/uL (ref 0.7–3.1)
Lymphs: 20 %
MCH: 33.9 pg — ABNORMAL HIGH (ref 26.6–33.0)
MCHC: 32.9 g/dL (ref 31.5–35.7)
MCV: 103 fL — ABNORMAL HIGH (ref 79–97)
Monocytes Absolute: 0.2 x10E3/uL (ref 0.1–0.9)
Monocytes: 6 %
Neutrophils Absolute: 2.7 x10E3/uL (ref 1.4–7.0)
Neutrophils: 73 %
Platelets: 128 x10E3/uL — ABNORMAL LOW (ref 150–450)
RBC: 4.01 x10E6/uL — ABNORMAL LOW (ref 4.14–5.80)
RDW: 13.1 % (ref 11.6–15.4)
WBC: 3.8 x10E3/uL (ref 3.4–10.8)

## 2023-10-14 LAB — HIV-1 RNA QUANT-NO REFLEX-BLD
HIV-1 RNA Viral Load Log: 1.477 {Log_copies}/mL
HIV-1 RNA Viral Load: 30 {copies}/mL

## 2023-10-14 LAB — COMPREHENSIVE METABOLIC PANEL WITH GFR
ALT: 18 IU/L (ref 0–44)
AST: 27 IU/L (ref 0–40)
Albumin: 4.8 g/dL (ref 3.8–4.8)
Alkaline Phosphatase: 66 IU/L (ref 44–121)
BUN/Creatinine Ratio: 13 (ref 10–24)
BUN: 28 mg/dL — AB (ref 8–27)
Bilirubin Total: 0.3 mg/dL (ref 0.0–1.2)
CO2: 20 mmol/L (ref 20–29)
Calcium: 9.9 mg/dL (ref 8.6–10.2)
Chloride: 102 mmol/L (ref 96–106)
Creatinine, Ser: 2.22 mg/dL — AB (ref 0.76–1.27)
Globulin, Total: 2.1 g/dL (ref 1.5–4.5)
Glucose: 89 mg/dL (ref 70–99)
Potassium: 5 mmol/L (ref 3.5–5.2)
Sodium: 138 mmol/L (ref 134–144)
Total Protein: 6.9 g/dL (ref 6.0–8.5)
eGFR: 30 mL/min/1.73 — AB (ref >59)

## 2023-10-14 LAB — GC/CHLAMYDIA PROBE AMP
Chlamydia trachomatis, NAA: NEGATIVE
Neisseria Gonorrhoeae by PCR: NEGATIVE

## 2023-10-14 LAB — LIPID PANEL
Cholesterol, Total: 129 mg/dL (ref 100–199)
HDL: 41 mg/dL (ref >39)
LDL CALC COMMENT:: 3.1 ratio (ref 0.0–5.0)
LDL Chol Calc (NIH): 67 mg/dL (ref 0–99)
Triglycerides: 114 mg/dL (ref 0–149)
VLDL Cholesterol Cal: 21 mg/dL (ref 5–40)

## 2023-10-14 LAB — RPR: RPR Ser Ql: NONREACTIVE

## 2023-10-15 ENCOUNTER — Ambulatory Visit: Payer: Self-pay | Admitting: Family Medicine

## 2023-10-15 ENCOUNTER — Other Ambulatory Visit: Payer: Self-pay | Admitting: Family Medicine

## 2023-10-15 DIAGNOSIS — Z21 Asymptomatic human immunodeficiency virus [HIV] infection status: Secondary | ICD-10-CM

## 2023-10-16 ENCOUNTER — Other Ambulatory Visit: Payer: Self-pay | Admitting: Family Medicine

## 2023-10-16 DIAGNOSIS — E785 Hyperlipidemia, unspecified: Secondary | ICD-10-CM

## 2023-10-20 ENCOUNTER — Other Ambulatory Visit: Payer: Self-pay | Admitting: Family Medicine

## 2023-10-20 DIAGNOSIS — G479 Sleep disorder, unspecified: Secondary | ICD-10-CM

## 2023-10-20 MED ORDER — BELSOMRA 10 MG PO TABS: 1.0000 | ORAL_TABLET | Freq: Every evening | ORAL | 1 refills | Status: AC | PRN

## 2023-10-20 NOTE — Telephone Encounter (Signed)
 Copied from CRM [id number]. Topic: Clinical - Medication Refill >> Oct 20, 2023  9:43 AM Graeme ORN wrote: Medication: Suvorexant  (BELSOMRA ) 10 MG TABS  Has the patient contacted their pharmacy? Yes (Agent: If no, request that the patient contact the pharmacy for the refill. If patient does not wish to contact the pharmacy document the reason why and proceed with request.) (Agent: If yes, when and what did the pharmacy advise?) wasn't on the list   This is the patient's preferred pharmacy:  [id number] - [place], [place] - [address] AT [address] [facility] [address] [place]  [address] Phone: [phone] Fax: [phone]  Is this the correct pharmacy for this prescription? Yes If no, delete pharmacy and type the correct one.   Has the prescription been filled recently? No  Is the patient out of the medication? Yes  Has the patient been seen for an appointment in the last year OR does the patient have an upcoming appointment? Yes 8/5  Can we respond through MyChart? Yes  Agent: Please be advised that Rx refills may take up to 3 business days. We ask that you follow-up with your pharmacy.

## 2023-10-27 DIAGNOSIS — M48062 Spinal stenosis, lumbar region with neurogenic claudication: Secondary | ICD-10-CM | POA: Diagnosis not present

## 2023-10-27 DIAGNOSIS — M79604 Pain in right leg: Secondary | ICD-10-CM | POA: Diagnosis not present

## 2023-10-27 DIAGNOSIS — M47816 Spondylosis without myelopathy or radiculopathy, lumbar region: Secondary | ICD-10-CM | POA: Diagnosis not present

## 2023-10-27 DIAGNOSIS — M48061 Spinal stenosis, lumbar region without neurogenic claudication: Secondary | ICD-10-CM | POA: Diagnosis not present

## 2023-10-27 DIAGNOSIS — M51369 Other intervertebral disc degeneration, lumbar region without mention of lumbar back pain or lower extremity pain: Secondary | ICD-10-CM | POA: Diagnosis not present

## 2023-11-05 DIAGNOSIS — M48062 Spinal stenosis, lumbar region with neurogenic claudication: Secondary | ICD-10-CM | POA: Diagnosis not present

## 2023-11-16 ENCOUNTER — Other Ambulatory Visit: Payer: Self-pay | Admitting: Family Medicine

## 2023-11-16 DIAGNOSIS — N521 Erectile dysfunction due to diseases classified elsewhere: Secondary | ICD-10-CM

## 2023-11-24 ENCOUNTER — Telehealth: Payer: Self-pay

## 2023-11-24 ENCOUNTER — Other Ambulatory Visit (HOSPITAL_COMMUNITY): Payer: Self-pay

## 2023-11-24 DIAGNOSIS — M48062 Spinal stenosis, lumbar region with neurogenic claudication: Secondary | ICD-10-CM | POA: Diagnosis not present

## 2023-11-24 NOTE — Telephone Encounter (Signed)
 Received request via Onbase/Patients pharmacy. I have contacted the pts pref'd pharmacy & confirmed with them to ''Run the discount card as Before as [employer] cover Rx. This has been confirmed and My Jabber line was given to leave a vm to confirm the Fill of the RX-Tadalafil . Cost is typically 22.00 for a 30 day supply as noted before in mychartnote from May 2025.

## 2023-11-26 NOTE — Telephone Encounter (Signed)
 Received a follow up call from Mckenzie at the pts pharmacy and the Rx has been filled successfully and ready for pickup.

## 2023-12-15 DIAGNOSIS — M48062 Spinal stenosis, lumbar region with neurogenic claudication: Secondary | ICD-10-CM | POA: Diagnosis not present

## 2024-01-14 ENCOUNTER — Other Ambulatory Visit: Payer: Self-pay | Admitting: Family Medicine

## 2024-01-14 DIAGNOSIS — E785 Hyperlipidemia, unspecified: Secondary | ICD-10-CM

## 2024-02-09 ENCOUNTER — Ambulatory Visit: Attending: Internal Medicine | Admitting: Internal Medicine

## 2024-02-09 ENCOUNTER — Encounter: Payer: Self-pay | Admitting: Internal Medicine

## 2024-02-09 VITALS — BP 151/70 | HR 74 | Ht 66.5 in | Wt 124.9 lb

## 2024-02-09 DIAGNOSIS — I2510 Atherosclerotic heart disease of native coronary artery without angina pectoris: Secondary | ICD-10-CM

## 2024-02-09 DIAGNOSIS — E782 Mixed hyperlipidemia: Secondary | ICD-10-CM

## 2024-02-09 DIAGNOSIS — I10 Essential (primary) hypertension: Secondary | ICD-10-CM | POA: Diagnosis not present

## 2024-02-09 DIAGNOSIS — I351 Nonrheumatic aortic (valve) insufficiency: Secondary | ICD-10-CM | POA: Diagnosis not present

## 2024-02-09 MED ORDER — ASPIRIN 81 MG PO TBEC: 81.0000 mg | DELAYED_RELEASE_TABLET | Freq: Every day | ORAL | Status: AC

## 2024-02-09 NOTE — Patient Instructions (Signed)
 Medication Instructions:  START Aspirin 81 mg daily  *If you need a refill on your cardiac medications before your next appointment, please call your pharmacy*  Follow-Up: At [facility], you and your health needs are our priority.  As part of our continuing mission to provide you with exceptional heart care, our providers are all part of one team.  This team includes your primary Cardiologist (physician) and Advanced Practice Providers or APPs (Physician Assistants and Nurse Practitioners) who all work together to provide you with the care you need, when you need it.  Your next appointment:   1 year(s)  Provider:   Emeline FORBES Calender, DO    We recommend signing up for the patient portal called MyChart.  Sign up information is provided on this After Visit Summary.  MyChart is used to connect with patients for Virtual Visits (Telemedicine).  Patients are able to view lab/test results, encounter notes, upcoming appointments, etc.  Non-urgent messages can be sent to your provider as well.   To learn more about what you can do with MyChart, go to [email or web address].   Other Instructions:  Please monitor your blood pressure for a week and send it to us  via mychart.  Blood Pressure Record Sheet To take your blood pressure, you will need a blood pressure machine. You can buy a blood pressure machine (blood pressure monitor) at your clinic, drug store, or online. When choosing one, consider: An automatic monitor that has an arm cuff. A cuff that wraps snugly around your upper arm. You should be able to fit only one finger between your arm and the cuff. A device that stores blood pressure reading results. Do not choose a monitor that measures your blood pressure from your wrist or finger. Follow your health care provider's instructions for how to take your blood pressure. To use this form: Take your blood pressure medications every day These measurements should be taken when you have  been at rest for at least 10-15 min Take at least 2 readings with each blood pressure check. This makes sure the results are correct. Wait 1-2 minutes between measurements. Write down the results in the spaces on this form. Keep in mind it should always be recorded systolic over diastolic. Both numbers are important.  Repeat this every day for 2-3 weeks, or as told by your health care provider.  Make a follow-up appointment with your health care provider to discuss the results.  Blood Pressure Log Date Medications taken? (Y/N) Blood Pressure Time of Day

## 2024-02-09 NOTE — Progress Notes (Signed)
 Cardiology Office Note   Date:  02/09/2024  ID:  [name], DOB 14-Oct-1945, MRN [id number] PCP: Joyce Norleen BROCKS, MD  [facility] Providers Cardiologist:  Emeline FORBES Calender, DO     History of Present Illness [name] is a 78 y.o. male who is a former patient of Dr. Alveta with past medical history of HIV, psoriatic arthritis, hypertension, CABG in 08/20/1998 (LIMA to LAD, SVG to intermediate, SVG to distal circumflex, SVG to PDA), mild to moderate aortic regurgitation, tobacco use who presents today for annual follow-up.  He was most recently seen by Jon Hails, PA on 04/25/2022 for preop cardiovascular evaluation for hernia repair and was able to walk 2 miles daily without angina at that time and had no further cardiac workup.  Today, he states he is doing well without any chest pain or shortness of breath.  He is still walking about 2 miles a day, weather permitting.  He has not been consistently taking his aspirin as he forgets on occasion.  He takes his antihypertensive in the evenings.  He says that he was nervous to meet the new provider today and so his blood pressure was elevated.  Does not have a high salt intake.  Otherwise no issues.       ROS:  Review of Systems  All other systems reviewed and are negative.   Physical Exam  Physical Exam Vitals and nursing note reviewed.  Constitutional:      Appearance: Normal appearance.  HENT:     Head: Normocephalic and atraumatic.  Eyes:     Conjunctiva/sclera: Conjunctivae normal.  Neck:     Vascular: No carotid bruit.  Cardiovascular:     Rate and Rhythm: Normal rate and regular rhythm.  Pulmonary:     Effort: Pulmonary effort is normal.     Breath sounds: Normal breath sounds.  Musculoskeletal:        General: No swelling or tenderness.  Skin:    Coloration: Skin is not jaundiced or pale.  Neurological:     Mental Status: He is alert.     VS:  BP (!) 151/70   Pulse 74   Ht 5' 6.5  (1.689 m)   Wt 124 lb 14.4 oz (56.7 kg)   SpO2 98%   BMI 19.86 kg/m         Wt Readings from Last 3 Encounters:  02/09/24 124 lb 14.4 oz (56.7 kg)  10/13/23 126 lb 3.2 oz (57.2 kg)  05/28/23 126 lb 6.4 oz (57.3 kg)     EKG Interpretation Date/Time:  Tuesday February 09 2024 08:44:38 EST Ventricular Rate:  74 PR Interval:  148 QRS Duration:  142 QT Interval:  444 QTC Calculation: 492 R Axis:   119  Text Interpretation: Sinus rhythm with occasional Premature ventricular complexes Right bundle branch block Left posterior fascicular block Bifascicular block When compared with ECG of 14-Nov-2022 09:06, Premature ventricular complexes are now Present Left posterior fascicular block is now Present Borderline criteria for Inferior infarct are no longer Present Confirmed by Calender Emeline [phone]) on 02/09/2024 8:50:59 AM    Studies Reviewed   Prior CV Studies: ECHO COMPLETE WO IMAGING ENHANCING AGENT 10/21/2021  1. Left ventricular ejection fraction by 3D volume is 52 %. The left ventricle has low normal function. The left ventricle demonstrates global hypokinesis. Left ventricular diastolic parameters are consistent with Grade II diastolic dysfunction (pseudonormalization). 2. Right ventricular systolic function is normal. The right ventricular size is normal. There  is normal pulmonary artery systolic pressure. 3. Left atrial size was mildly dilated. 4. The mitral valve is myxomatous. Mild mitral valve regurgitation. No evidence of mitral stenosis. 5. Tricuspid valve regurgitation is mild to moderate. 6. The aortic valve is tricuspid. There is moderate thickening of the aortic valve. Aortic valve regurgitation is mild to moderate. Aortic valve sclerosis/calcification is present, without any evidence of aortic stenosis. 7. The inferior vena cava is normal in size with <50% respiratory variability, suggesting right atrial pressure of 8 mmHg.  Comparison(s): No prior Echocardiogram.       Risk Assessment/Calculations         ASCVD risk score: The ASCVD Risk score (Arnett DK, et al., 2019) failed to calculate for the following reasons:   The valid total cholesterol range is 130 to 320 mg/dL   ASSESSMENT  CABG in 3/87/7999 (LIMA to LAD, SVG to intermediate, SVG to distal circumflex, SVG to PDA), stable.  Not taking aspirin  consistently.  On rosuvastatin  20 mg. Mild to moderate aortic insufficiency asymptomatic Hypertension BP elevated today.  On losartan -HCTZ 50-12.5 mg Hyperlipidemia well-controlled on rosuvastatin    Plan  Encouraged to take aspirin  81 mg daily Goal blood pressure less than 130/80 mmHg.  He is going to check his blood pressure over the next week and send a MyChart.  If his blood pressure is not at goal then we will consider increasing losartan -HCTZ to 50-25 mg Cardiac risk counseling and prevention recommendations: Heart healthy/Mediterranean diet with whole grains, fruits, vegetable, fish, lean meats, nuts, and olive oil. Limit salt. Moderate walking, 3-5 times/week for 30-50 minutes each session. Aim for at least 150 minutes.week. Goal should be pace of 3 miles/hour, or walking 1.5 miles in 30 minutes Avoidance of tobacco products. Avoid excess alcohol.  Follow up: 1 year          Signed, Emeline FORBES Calender, DO

## 2024-02-14 ENCOUNTER — Other Ambulatory Visit: Payer: Self-pay | Admitting: Family Medicine

## 2024-02-14 DIAGNOSIS — N521 Erectile dysfunction due to diseases classified elsewhere: Secondary | ICD-10-CM

## 2024-02-17 ENCOUNTER — Telehealth: Payer: Self-pay | Admitting: Family Medicine

## 2024-02-17 NOTE — Telephone Encounter (Signed)
 Called Patient, LVM for him to call back.

## 2024-02-17 NOTE — Telephone Encounter (Signed)
 Copied from CRM #[id number]. Topic: Appointments - Scheduling Inquiry for Clinic >> Feb 16, 2024 12:45 PM Willma R wrote: Reason for CRM: Patient states he received a call to reschedule his AWV from March to August but would like to know the reason why he needs to reschedule first. Is requesting a call back.  Patient can be reached at [phone]

## 2024-02-22 ENCOUNTER — Telehealth: Payer: Self-pay | Admitting: Pharmacy Technician

## 2024-02-22 ENCOUNTER — Other Ambulatory Visit (HOSPITAL_COMMUNITY): Payer: Self-pay

## 2024-02-22 NOTE — Telephone Encounter (Signed)
 Pharmacy Patient Advocate Encounter   Received notification from Onbase/CMM that prior authorization for Tadalafil  5mg  is required/requested. (Initial CMM KEY: [id number]- would not verify eligibility, so I started over.   Insurance verification completed.   The patient is insured through [employer].   Per test claim: PA required; PA submitted to above mentioned insurance via Latent Key/confirmation #/EOC [id number] Status is pending

## 2024-02-24 NOTE — Telephone Encounter (Signed)
 Pharmacy Patient Advocate Encounter  Received notification from [employer] that Prior Authorization for Tadalafil  5mg  tabs has been DENIED.  Full denial letter will be uploaded to the media tab. See denial reason below.

## 2024-03-07 ENCOUNTER — Telehealth: Payer: Self-pay | Admitting: Internal Medicine

## 2024-03-07 DIAGNOSIS — I10 Essential (primary) hypertension: Secondary | ICD-10-CM

## 2024-03-07 NOTE — Telephone Encounter (Signed)
 Pt requesting c/b from a nurse. Please advise.

## 2024-03-08 MED ORDER — LOSARTAN POTASSIUM-HCTZ 50-12.5 MG PO TABS: 1.0000 | ORAL_TABLET | Freq: Every day | ORAL | Status: DC

## 2024-03-08 MED ORDER — LOSARTAN POTASSIUM-HCTZ 100-25 MG PO TABS: 1.0000 | ORAL_TABLET | Freq: Every day | ORAL | 3 refills | Status: DC

## 2024-03-08 MED ORDER — AMLODIPINE BESYLATE 5 MG PO TABS: 5.0000 mg | ORAL_TABLET | Freq: Every day | ORAL | 3 refills | Status: DC

## 2024-03-08 NOTE — Telephone Encounter (Signed)
"  Left message to call back.   "

## 2024-03-08 NOTE — Telephone Encounter (Signed)
 Patient's Cr in August was trending up. Will recheck BMP tomorrow prior to initiating higher dose of losartan -hydrochlorothiazide . Discussed with the patient over the phone who is in agreement.   [name] Calender, DO

## 2024-03-08 NOTE — Telephone Encounter (Signed)
 Blood pressure remains elevated with systolic up to 170 and diastolic up to 84.  I would recommend increasing Hyzaar to 50-25 mg and adding amlodipine 5 mg.  Thank you, Emeline Calender, DO

## 2024-03-08 NOTE — Telephone Encounter (Signed)
 Pt sending in BP readings per Dr. Kriste request. Unable to send via MyChart (All AM readings; Pt states he takes at Hyzaar night) 12/14: 130/75 12/15: 153/85 12/16: 174/80 12/17: 166/84 12/18: 163/86 12/19: 164/84 12/20: 151/80 12/21: 147/80 Will send to Dr. Kriste for any advisement. Pt verbalizes understanding of plan.

## 2024-03-08 NOTE — Addendum Note (Signed)
 Addended by: VICCI ROXIE CROME on: 03/08/2024 04:49 PM   Modules accepted: Orders

## 2024-03-08 NOTE — Addendum Note (Signed)
 Addended by: Preet Perrier E on: 03/08/2024 06:03 PM   Modules accepted: Orders

## 2024-03-08 NOTE — Addendum Note (Signed)
 Addended by: Darrelle Barrell E on: 03/08/2024 05:19 PM   Modules accepted: Orders

## 2024-03-09 LAB — BASIC METABOLIC PANEL WITH GFR
BUN/Creatinine Ratio: 14 (ref 10–24)
BUN: 35 mg/dL — ABNORMAL HIGH (ref 8–27)
CO2: 22 mmol/L (ref 20–29)
Calcium: 9.9 mg/dL (ref 8.6–10.2)
Chloride: 103 mmol/L (ref 96–106)
Creatinine, Ser: 2.44 mg/dL — ABNORMAL HIGH (ref 0.76–1.27)
Glucose: 96 mg/dL (ref 70–99)
Potassium: 5.1 mmol/L (ref 3.5–5.2)
Sodium: 137 mmol/L (ref 134–144)
eGFR: 26 mL/min/1.73 — ABNORMAL LOW

## 2024-03-10 ENCOUNTER — Ambulatory Visit: Payer: Self-pay | Admitting: Internal Medicine

## 2024-03-10 DIAGNOSIS — I10 Essential (primary) hypertension: Secondary | ICD-10-CM

## 2024-03-10 MED ORDER — AMLODIPINE BESYLATE 10 MG PO TABS: 10.0000 mg | ORAL_TABLET | Freq: Every day | ORAL | 3 refills | Status: AC

## 2024-03-10 NOTE — Progress Notes (Signed)
 Creatinine is trending up. Will hold Losartan -hydrochlorothiazide  and change to amlodipine 10 mg with repeat BMP in 1 week.

## 2024-03-11 ENCOUNTER — Other Ambulatory Visit: Payer: Self-pay

## 2024-03-11 DIAGNOSIS — I10 Essential (primary) hypertension: Secondary | ICD-10-CM

## 2024-03-17 LAB — BASIC METABOLIC PANEL WITH GFR
BUN/Creatinine Ratio: 15 (ref 10–24)
BUN: 35 mg/dL — ABNORMAL HIGH (ref 8–27)
CO2: 22 mmol/L (ref 20–29)
Calcium: 9.8 mg/dL (ref 8.6–10.2)
Chloride: 99 mmol/L (ref 96–106)
Creatinine, Ser: 2.31 mg/dL — ABNORMAL HIGH (ref 0.76–1.27)
Glucose: 98 mg/dL (ref 70–99)
Potassium: 4.9 mmol/L (ref 3.5–5.2)
Sodium: 134 mmol/L (ref 134–144)
eGFR: 28 mL/min/1.73 — ABNORMAL LOW

## 2024-03-18 ENCOUNTER — Ambulatory Visit: Payer: Self-pay | Admitting: Internal Medicine

## 2024-03-18 DIAGNOSIS — I10 Essential (primary) hypertension: Secondary | ICD-10-CM

## 2024-03-18 DIAGNOSIS — N1831 Chronic kidney disease, stage 3a: Secondary | ICD-10-CM

## 2024-03-18 NOTE — Progress Notes (Signed)
 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  Please follow-up with the patient regarding his blood pressures and have him referred to nephrology if he has not already seen them in the past.  Thank you, Emeline Calender, DO

## 2024-04-07 NOTE — Progress Notes (Signed)
 Bane Hagy                                          MRN: [id number]   04/07/2024   The [facility] Quality Team Specialist reviewed this patient medical record for the purposes of chart review for care gap closure. The following were reviewed: chart review for care gap closure-controlling blood pressure.    [facility] Quality Team

## 2024-06-07 ENCOUNTER — Ambulatory Visit: Payer: Self-pay | Admitting: Family Medicine
